# Patient Record
Sex: Female | Born: 1970 | Race: Black or African American | Hispanic: No | Marital: Single | Smoking: Current every day smoker
Health system: Southern US, Community
[De-identification: ages and names within clinical notes are randomized; demographics above are authoritative.]

## PROBLEM LIST (undated history)

## (undated) DIAGNOSIS — I071 Rheumatic tricuspid insufficiency: Secondary | ICD-10-CM

## (undated) DIAGNOSIS — F259 Schizoaffective disorder, unspecified: Secondary | ICD-10-CM

## (undated) DIAGNOSIS — I10 Essential (primary) hypertension: Secondary | ICD-10-CM

## (undated) DIAGNOSIS — I2510 Atherosclerotic heart disease of native coronary artery without angina pectoris: Secondary | ICD-10-CM

## (undated) DIAGNOSIS — I509 Heart failure, unspecified: Secondary | ICD-10-CM

## (undated) DIAGNOSIS — I472 Ventricular tachycardia: Secondary | ICD-10-CM

## (undated) DIAGNOSIS — E119 Type 2 diabetes mellitus without complications: Secondary | ICD-10-CM

## (undated) DIAGNOSIS — J45909 Unspecified asthma, uncomplicated: Secondary | ICD-10-CM

## (undated) DIAGNOSIS — I4729 Other ventricular tachycardia: Secondary | ICD-10-CM

## (undated) DIAGNOSIS — I428 Other cardiomyopathies: Secondary | ICD-10-CM

## (undated) DIAGNOSIS — I340 Nonrheumatic mitral (valve) insufficiency: Secondary | ICD-10-CM

## (undated) DIAGNOSIS — I5020 Unspecified systolic (congestive) heart failure: Secondary | ICD-10-CM

## (undated) HISTORY — DX: Nonrheumatic mitral (valve) insufficiency: I34.0

## (undated) HISTORY — DX: Heart failure, unspecified: I50.9

## (undated) HISTORY — DX: Unspecified systolic (congestive) heart failure: I50.20

## (undated) HISTORY — DX: Essential (primary) hypertension: I10

## (undated) HISTORY — DX: Morbid (severe) obesity due to excess calories: E66.01

## (undated) HISTORY — DX: Ventricular tachycardia: I47.2

## (undated) HISTORY — DX: Schizoaffective disorder, unspecified: F25.9

## (undated) HISTORY — DX: Other ventricular tachycardia: I47.29

## (undated) HISTORY — DX: Other cardiomyopathies: I42.8

## (undated) HISTORY — DX: Rheumatic tricuspid insufficiency: I07.1

---

## 2021-07-04 ENCOUNTER — Emergency Department: Payer: Medicare Other

## 2021-07-04 ENCOUNTER — Inpatient Hospital Stay
Admission: EM | Admit: 2021-07-04 | Discharge: 2021-07-12 | DRG: 175 | Disposition: A | Discharge status: Home Health | Payer: Medicare Other | Attending: Internal Medicine | Admitting: Internal Medicine

## 2021-07-04 ENCOUNTER — Other Ambulatory Visit: Payer: Self-pay

## 2021-07-04 DIAGNOSIS — I428 Other cardiomyopathies: Secondary | ICD-10-CM

## 2021-07-04 DIAGNOSIS — I2609 Other pulmonary embolism with acute cor pulmonale: Secondary | ICD-10-CM | POA: Diagnosis present

## 2021-07-04 DIAGNOSIS — I471 Supraventricular tachycardia: Secondary | ICD-10-CM | POA: Diagnosis not present

## 2021-07-04 DIAGNOSIS — F319 Bipolar disorder, unspecified: Secondary | ICD-10-CM | POA: Diagnosis present

## 2021-07-04 DIAGNOSIS — I252 Old myocardial infarction: Secondary | ICD-10-CM

## 2021-07-04 DIAGNOSIS — F209 Schizophrenia, unspecified: Secondary | ICD-10-CM | POA: Diagnosis present

## 2021-07-04 DIAGNOSIS — Z8249 Family history of ischemic heart disease and other diseases of the circulatory system: Secondary | ICD-10-CM

## 2021-07-04 DIAGNOSIS — I5082 Biventricular heart failure: Secondary | ICD-10-CM | POA: Diagnosis present

## 2021-07-04 DIAGNOSIS — J189 Pneumonia, unspecified organism: Secondary | ICD-10-CM

## 2021-07-04 DIAGNOSIS — I429 Cardiomyopathy, unspecified: Secondary | ICD-10-CM

## 2021-07-04 DIAGNOSIS — R570 Cardiogenic shock: Secondary | ICD-10-CM | POA: Diagnosis not present

## 2021-07-04 DIAGNOSIS — R188 Other ascites: Secondary | ICD-10-CM | POA: Diagnosis present

## 2021-07-04 DIAGNOSIS — I5043 Acute on chronic combined systolic (congestive) and diastolic (congestive) heart failure: Secondary | ICD-10-CM | POA: Diagnosis present

## 2021-07-04 DIAGNOSIS — I110 Hypertensive heart disease with heart failure: Secondary | ICD-10-CM | POA: Diagnosis present

## 2021-07-04 DIAGNOSIS — E785 Hyperlipidemia, unspecified: Secondary | ICD-10-CM | POA: Diagnosis present

## 2021-07-04 DIAGNOSIS — Z79899 Other long term (current) drug therapy: Secondary | ICD-10-CM | POA: Diagnosis not present

## 2021-07-04 DIAGNOSIS — Z87891 Personal history of nicotine dependence: Secondary | ICD-10-CM

## 2021-07-04 DIAGNOSIS — I248 Other forms of acute ischemic heart disease: Secondary | ICD-10-CM | POA: Diagnosis present

## 2021-07-04 DIAGNOSIS — Z6841 Body Mass Index (BMI) 40.0 and over, adult: Secondary | ICD-10-CM | POA: Diagnosis not present

## 2021-07-04 DIAGNOSIS — I5041 Acute combined systolic (congestive) and diastolic (congestive) heart failure: Secondary | ICD-10-CM | POA: Diagnosis not present

## 2021-07-04 DIAGNOSIS — I2510 Atherosclerotic heart disease of native coronary artery without angina pectoris: Secondary | ICD-10-CM | POA: Diagnosis present

## 2021-07-04 DIAGNOSIS — I2699 Other pulmonary embolism without acute cor pulmonale: Secondary | ICD-10-CM | POA: Diagnosis present

## 2021-07-04 DIAGNOSIS — E872 Acidosis: Secondary | ICD-10-CM | POA: Diagnosis present

## 2021-07-04 DIAGNOSIS — Z833 Family history of diabetes mellitus: Secondary | ICD-10-CM | POA: Diagnosis not present

## 2021-07-04 DIAGNOSIS — I081 Rheumatic disorders of both mitral and tricuspid valves: Secondary | ICD-10-CM | POA: Diagnosis present

## 2021-07-04 DIAGNOSIS — I10 Essential (primary) hypertension: Secondary | ICD-10-CM | POA: Diagnosis not present

## 2021-07-04 DIAGNOSIS — I50810 Right heart failure, unspecified: Secondary | ICD-10-CM

## 2021-07-04 DIAGNOSIS — Z452 Encounter for adjustment and management of vascular access device: Secondary | ICD-10-CM

## 2021-07-04 DIAGNOSIS — A419 Sepsis, unspecified organism: Secondary | ICD-10-CM

## 2021-07-04 DIAGNOSIS — I5023 Acute on chronic systolic (congestive) heart failure: Secondary | ICD-10-CM | POA: Diagnosis not present

## 2021-07-04 DIAGNOSIS — I420 Dilated cardiomyopathy: Secondary | ICD-10-CM | POA: Diagnosis present

## 2021-07-04 DIAGNOSIS — Z20822 Contact with and (suspected) exposure to covid-19: Secondary | ICD-10-CM | POA: Diagnosis present

## 2021-07-04 DIAGNOSIS — E1165 Type 2 diabetes mellitus with hyperglycemia: Secondary | ICD-10-CM | POA: Diagnosis present

## 2021-07-04 DIAGNOSIS — I5033 Acute on chronic diastolic (congestive) heart failure: Secondary | ICD-10-CM | POA: Diagnosis not present

## 2021-07-04 DIAGNOSIS — Z7984 Long term (current) use of oral hypoglycemic drugs: Secondary | ICD-10-CM | POA: Diagnosis not present

## 2021-07-04 DIAGNOSIS — Z90710 Acquired absence of both cervix and uterus: Secondary | ICD-10-CM

## 2021-07-04 DIAGNOSIS — I2693 Single subsegmental pulmonary embolism without acute cor pulmonale: Secondary | ICD-10-CM | POA: Diagnosis not present

## 2021-07-04 DIAGNOSIS — J45909 Unspecified asthma, uncomplicated: Secondary | ICD-10-CM | POA: Diagnosis present

## 2021-07-04 HISTORY — DX: Unspecified asthma, uncomplicated: J45.909

## 2021-07-04 HISTORY — DX: Essential (primary) hypertension: I10

## 2021-07-04 HISTORY — DX: Atherosclerotic heart disease of native coronary artery without angina pectoris: I25.10

## 2021-07-04 HISTORY — DX: Type 2 diabetes mellitus without complications: E11.9

## 2021-07-04 LAB — COMPREHENSIVE METABOLIC PANEL
ALT: 49 U/L — ABNORMAL HIGH (ref 0–44)
AST: 39 U/L (ref 15–41)
Albumin: 3.3 g/dL — ABNORMAL LOW (ref 3.5–5.0)
Alkaline Phosphatase: 65 U/L (ref 38–126)
Anion gap: 11 (ref 5–15)
BUN: 13 mg/dL (ref 6–20)
CO2: 22 mmol/L (ref 22–32)
Calcium: 8.9 mg/dL (ref 8.9–10.3)
Chloride: 103 mmol/L (ref 98–111)
Creatinine, Ser: 0.87 mg/dL (ref 0.44–1.00)
GFR, Estimated: >60 mL/min (ref >60)
Glucose, Bld: 339 mg/dL — ABNORMAL HIGH (ref 70–99)
Potassium: 3.9 mmol/L (ref 3.5–5.1)
Sodium: 136 mmol/L (ref 135–145)
Total Bilirubin: 0.8 mg/dL (ref 0.3–1.2)
Total Protein: 6.6 g/dL (ref 6.5–8.1)

## 2021-07-04 LAB — RESP PANEL BY RT-PCR (FLU A&B, COVID) ARPGX2
Influenza A by PCR: NEGATIVE
Influenza B by PCR: NEGATIVE
SARS Coronavirus 2 by RT PCR: NEGATIVE

## 2021-07-04 LAB — CBC WITH DIFFERENTIAL/PLATELET
Abs Immature Granulocytes: 0.12 10*3/uL — ABNORMAL HIGH (ref 0.00–0.07)
Basophils Absolute: 0.1 10*3/uL (ref 0.0–0.1)
Basophils Relative: 0 %
Eosinophils Absolute: 0 10*3/uL (ref 0.0–0.5)
Eosinophils Relative: 0 %
HCT: 39 % (ref 36.0–46.0)
Hemoglobin: 12.3 g/dL (ref 12.0–15.0)
Immature Granulocytes: 1 %
Lymphocytes Relative: 8 %
Lymphs Abs: 1.6 10*3/uL (ref 0.7–4.0)
MCH: 29.1 pg (ref 26.0–34.0)
MCHC: 31.5 g/dL (ref 30.0–36.0)
MCV: 92.2 fL (ref 80.0–100.0)
Monocytes Absolute: 1.3 10*3/uL — ABNORMAL HIGH (ref 0.1–1.0)
Monocytes Relative: 6 %
Neutro Abs: 17.6 10*3/uL — ABNORMAL HIGH (ref 1.7–7.7)
Neutrophils Relative %: 85 %
Platelets: 372 10*3/uL (ref 150–400)
RBC: 4.23 MIL/uL (ref 3.87–5.11)
RDW: 14.6 % (ref 11.5–15.5)
WBC: 20.7 10*3/uL — ABNORMAL HIGH (ref 4.0–10.5)
nRBC: 0 % (ref 0.0–0.2)

## 2021-07-04 LAB — MAGNESIUM: Magnesium: 1.4 mg/dL — ABNORMAL LOW (ref 1.7–2.4)

## 2021-07-04 LAB — PROTIME-INR
INR: 1.1 (ref 0.8–1.2)
Prothrombin Time: 14 seconds (ref 11.4–15.2)

## 2021-07-04 LAB — BRAIN NATRIURETIC PEPTIDE: B Natriuretic Peptide: 1128.3 pg/mL — ABNORMAL HIGH (ref 0.0–100.0)

## 2021-07-04 LAB — LIPASE, BLOOD: Lipase: 34 U/L (ref 11–51)

## 2021-07-04 LAB — LACTIC ACID, PLASMA: Lactic Acid, Venous: 3.2 mmol/L (ref 0.5–1.9)

## 2021-07-04 LAB — TROPONIN I (HIGH SENSITIVITY)
Troponin I (High Sensitivity): 101 ng/L (ref <18)
Troponin I (High Sensitivity): 92 ng/L — ABNORMAL HIGH (ref <18)

## 2021-07-04 LAB — APTT: aPTT: 28 seconds (ref 24–36)

## 2021-07-04 MED ORDER — ONDANSETRON HCL 4 MG/2ML IJ SOLN: 4.0000 mg | Freq: Four times a day (QID) | INTRAMUSCULAR | Status: DC | PRN

## 2021-07-04 MED ORDER — FUROSEMIDE 10 MG/ML IJ SOLN
20.0000 mg | Freq: Two times a day (BID) | INTRAMUSCULAR | Status: DC
  Administered 2021-07-05: 20 mg via INTRAVENOUS
  Filled 2021-07-04: qty 2

## 2021-07-04 MED ORDER — PANTOPRAZOLE SODIUM 40 MG PO TBEC
40.0000 mg | DELAYED_RELEASE_TABLET | Freq: Every day | ORAL | Status: DC
  Administered 2021-07-05 – 2021-07-12 (×8): 40 mg via ORAL
  Filled 2021-07-04 (×8): qty 1

## 2021-07-04 MED ORDER — METOPROLOL TARTRATE 5 MG/5ML IV SOLN
5.0000 mg | INTRAVENOUS | Status: AC | PRN
  Administered 2021-07-04 – 2021-07-05 (×3): 5 mg via INTRAVENOUS
  Filled 2021-07-04 (×3): qty 5

## 2021-07-04 MED ORDER — SODIUM CHLORIDE 0.9 % IV SOLN
500.0000 mg | Freq: Once | INTRAVENOUS | Status: AC
  Administered 2021-07-04: 500 mg via INTRAVENOUS
  Filled 2021-07-04: qty 500

## 2021-07-04 MED ORDER — POTASSIUM PHOSPHATE MONOBASIC 500 MG PO TABS
500.0000 mg | ORAL_TABLET | Freq: Two times a day (BID) | ORAL | Status: DC
  Administered 2021-07-05 – 2021-07-12 (×15): 500 mg via ORAL
  Filled 2021-07-04 (×17): qty 1

## 2021-07-04 MED ORDER — SODIUM CHLORIDE 0.9 % IV SOLN
2.0000 g | INTRAVENOUS | Status: DC
  Administered 2021-07-05: 2 g via INTRAVENOUS
  Filled 2021-07-04: qty 2
  Filled 2021-07-04: qty 20

## 2021-07-04 MED ORDER — ONDANSETRON HCL 4 MG PO TABS: 4.0000 mg | ORAL_TABLET | Freq: Four times a day (QID) | ORAL | Status: DC | PRN

## 2021-07-04 MED ORDER — IOHEXOL 350 MG/ML SOLN
100.0000 mL | Freq: Once | INTRAVENOUS | Status: AC | PRN
  Administered 2021-07-04: 100 mL via INTRAVENOUS

## 2021-07-04 MED ORDER — HEPARIN (PORCINE) 25000 UT/250ML-% IV SOLN
1550.0000 [IU]/h | INTRAVENOUS | Status: DC
  Administered 2021-07-04: 1350 [IU]/h via INTRAVENOUS
  Administered 2021-07-05: 1500 [IU]/h via INTRAVENOUS
  Administered 2021-07-06: 2100 [IU]/h via INTRAVENOUS
  Administered 2021-07-06: 1650 [IU]/h via INTRAVENOUS
  Administered 2021-07-07: 1950 [IU]/h via INTRAVENOUS
  Administered 2021-07-07: 1650 [IU]/h via INTRAVENOUS
  Administered 2021-07-08 – 2021-07-09 (×2): 1550 [IU]/h via INTRAVENOUS
  Filled 2021-07-04 (×8): qty 250

## 2021-07-04 MED ORDER — DIGOXIN 0.25 MG/ML IJ SOLN
0.2500 mg | Freq: Once | INTRAMUSCULAR | Status: AC
  Administered 2021-07-05: 0.25 mg via INTRAVENOUS
  Filled 2021-07-04: qty 2

## 2021-07-04 MED ORDER — HALOPERIDOL 5 MG PO TABS
10.0000 mg | ORAL_TABLET | Freq: Every day | ORAL | Status: DC
  Administered 2021-07-05 – 2021-07-11 (×6): 10 mg via ORAL
  Filled 2021-07-04 (×8): qty 2

## 2021-07-04 MED ORDER — SODIUM CHLORIDE 0.9 % IV SOLN
500.0000 mg | INTRAVENOUS | Status: DC
  Filled 2021-07-04: qty 500

## 2021-07-04 MED ORDER — TRAZODONE HCL 50 MG PO TABS
25.0000 mg | ORAL_TABLET | Freq: Every evening | ORAL | Status: DC | PRN
Start: 2021-07-04 — End: 2021-07-12
  Administered 2021-07-08: 25 mg via ORAL
  Filled 2021-07-04: qty 1

## 2021-07-04 MED ORDER — ATORVASTATIN CALCIUM 20 MG PO TABS
20.0000 mg | ORAL_TABLET | Freq: Every day | ORAL | Status: DC
  Administered 2021-07-05 – 2021-07-12 (×8): 20 mg via ORAL
  Filled 2021-07-04 (×8): qty 1

## 2021-07-04 MED ORDER — ALBUTEROL SULFATE (2.5 MG/3ML) 0.083% IN NEBU: 2.5000 mg | INHALATION_SOLUTION | RESPIRATORY_TRACT | Status: DC | PRN

## 2021-07-04 MED ORDER — SODIUM CHLORIDE 0.9 % IV SOLN
2.0000 g | Freq: Once | INTRAVENOUS | Status: AC
  Administered 2021-07-04: 2 g via INTRAVENOUS
  Filled 2021-07-04: qty 20

## 2021-07-04 MED ORDER — GLIPIZIDE 5 MG PO TABS
5.0000 mg | ORAL_TABLET | Freq: Two times a day (BID) | ORAL | Status: DC
  Filled 2021-07-04: qty 1

## 2021-07-04 MED ORDER — ACETAMINOPHEN 650 MG RE SUPP: 650.0000 mg | Freq: Four times a day (QID) | RECTAL | Status: DC | PRN

## 2021-07-04 MED ORDER — HEPARIN BOLUS VIA INFUSION
5400.0000 [IU] | Freq: Once | INTRAVENOUS | Status: AC
  Administered 2021-07-04: 5400 [IU] via INTRAVENOUS
  Filled 2021-07-04: qty 5400

## 2021-07-04 MED ORDER — MAGNESIUM HYDROXIDE 400 MG/5ML PO SUSP: 30.0000 mL | Freq: Every day | ORAL | Status: DC | PRN

## 2021-07-04 MED ORDER — LACTATED RINGERS IV BOLUS
1000.0000 mL | Freq: Once | INTRAVENOUS | Status: AC
  Administered 2021-07-04: 1000 mL via INTRAVENOUS

## 2021-07-04 MED ORDER — ALBUTEROL SULFATE HFA 108 (90 BASE) MCG/ACT IN AERS: 1.0000 | INHALATION_SPRAY | RESPIRATORY_TRACT | Status: DC | PRN

## 2021-07-04 MED ORDER — MAGNESIUM SULFATE 2 GM/50ML IV SOLN
2.0000 g | Freq: Once | INTRAVENOUS | Status: AC
  Administered 2021-07-04: 2 g via INTRAVENOUS
  Filled 2021-07-04: qty 50

## 2021-07-04 MED ORDER — DULOXETINE HCL 30 MG PO CPEP
60.0000 mg | ORAL_CAPSULE | Freq: Every day | ORAL | Status: DC
  Administered 2021-07-05 – 2021-07-12 (×8): 60 mg via ORAL
  Filled 2021-07-04 (×8): qty 2

## 2021-07-04 MED ORDER — POTASSIUM CHLORIDE CRYS ER 20 MEQ PO TBCR
20.0000 meq | EXTENDED_RELEASE_TABLET | Freq: Every day | ORAL | Status: DC
  Administered 2021-07-05 – 2021-07-12 (×8): 20 meq via ORAL
  Filled 2021-07-04 (×8): qty 1

## 2021-07-04 MED ORDER — ACETAMINOPHEN 325 MG PO TABS
650.0000 mg | ORAL_TABLET | Freq: Four times a day (QID) | ORAL | Status: DC | PRN
  Administered 2021-07-05 – 2021-07-11 (×2): 650 mg via ORAL
  Filled 2021-07-04 (×2): qty 2

## 2021-07-04 NOTE — ED Notes (Addendum)
Patient complaining of right arm pain after having to hold her arms up in the CT scan. BP 84/68 Larinda Buttery, MD aware.

## 2021-07-04 NOTE — ED Notes (Signed)
Patient to CT at this time

## 2021-07-04 NOTE — Consult Note (Signed)
ANTICOAGULATION CONSULT NOTE - Initial Consult  Pharmacy Consult for Heparin infusion Indication: pulmonary embolus  No Known Allergies  Patient Measurements: Height: 5\' 4"  (162.6 cm) Weight: 97.1 kg (214 lb) IBW/kg (Calculated) : 54.7 Heparin Dosing Weight: 77 kg   Vital Signs: Temp: 97.6 F (36.4 C) (07/14 1908) Temp Source: Oral (07/14 1908) BP: 94/74 (07/14 2130) Pulse Rate: 129 (07/14 2130)  Labs: Recent Labs    07/04/21 1942  HGB 12.3  HCT 39.0  PLT 372  CREATININE 0.87  TROPONINIHS 101*    Estimated Creatinine Clearance: 87.6 mL/min (by C-G formula based on SCr of 0.87 mg/dL).   Medical History: History reviewed. No pertinent past medical history.  Medications:  No prior anticoagulation noted  Assessment: 50 y/o female with Hx of recent MI, presents with chest pain. CT positive for RLL PE without evidence of right heart strain. Pharmacy has been consulted for initiation and management of heparin infusion for PE.  Goal of Therapy:  Heparin level 0.3-0.7 units/ml Anti-Xa level 0.6-1 units/ml 4hrs after LMWH dose given Monitor platelets by anticoagulation protocol: Yes   Plan:  Give 5400 units bolus x 1 Start heparin infusion at 1350 units/hr Check anti-Xa level in 6 hours and daily while on heparin Continue to monitor H&H and platelets  44, PharmD, BCPS Clinical Pharmacist   07/04/2021,9:43 PM

## 2021-07-04 NOTE — ED Triage Notes (Addendum)
Pt in with co chest pain that started today, pt states hx of MI in April. Pt also having night sweat for 2 days. States hx of tachycardia and was due to low potassium and magnesium.

## 2021-07-04 NOTE — ED Notes (Signed)
Fluids stopped per Larinda Buttery, MD.

## 2021-07-04 NOTE — H&P (Addendum)
[place]   PATIENT NAME: Martha Ramos    MR#:  [id number]  DATE OF BIRTH:  04-11-1971  DATE OF ADMISSION:  07/04/2021  PRIMARY CARE PHYSICIAN: Darnelle Spangle, MD   Patient is coming from: Home  REQUESTING/REFERRING PHYSICIAN: Chesley Noon, MD  CHIEF COMPLAINT:   Chief Complaint  Patient presents with   Chest Pain    HISTORY OF PRESENT ILLNESS:  Martha Ramos is a 50 y.o. African American female with medical history significant for asthma, type 2 diabetes mellitus, hypertension and coronary artery disease, who presented to the emergency room with acute onset of midsternal chest pain felt this tightness that started earlier today with associated dyspnea as well as cough occasionally productive of clear sputum as well as occasional wheezing.  She graded her pain was 10/10 in severity.  She was having mild palpitations.  She also admits to nausea and vomiting without diarrhea or melena or bright red bleeding per rectum.  No bilious vomitus or hematemesis or other bleeding diathesis.  She denies any leg pain or swelling or recent travels or surgeries.  No dysuria, oliguria or hematuria or flank pain she admitted to mild headache without dizziness or blurred vision.  ED Course: Upon presentation to the emergency room blood pressure was 99/83 with a heart rate of 161 and respiratory to of 24 and later 33 and heart rate later was 146 with pulse oximetry of 96% and later 100% on room air.  Labs revealed a blood glucose of 339 and magnesium 1.4 with potassium of 3.9 with otherwise unremarkable CMP.  BNP was 1128.3 and high-sensitivity troponin was 101.  Lactic acid was 3.2 and CBC showed leukocytosis of 20.7 with neutrophilia.  INR was 1.1 with PT of 14 and PTT of 28.  UA was positive for UTI.  Influenza antigens and COVID-19 PCR came back negative.  Repeat troponin I came back 92. EKG as reviewed by me : Initial EKG showed sinus tachycardia with a rate of 157 with left axis  deviation and poor R wave progression. Imaging: EKG showed small effusions bilaterally with likely right basilar atelectasis. Chest CTA and abdominal and pelvic CT revealed the following: 1. Right lower lobe lobar pulmonary artery embolus. No CT evidence of right heart straining. 2. Mild cardiomegaly with evidence of right heart dysfunction. Correlation with echocardiogram recommended. 3. Small bilateral pleural effusions with bibasilar atelectasis. Pneumonia is not excluded. Clinical correlation is recommended. 4. Small ascites and diffuse subcutaneous edema and anasarca. 5. No bowel obstruction. Normal appendix. 6. Aortic Atherosclerosis.  The patient was given IV heparin bolus and infusion, IV Rocephin and Zithromax, 1 L bolus of IV lactated ringer and 2 g of IV magnesium sulfate as well as 5 mg IV Lopressor.  Given borderline blood pressure, she will be admitted to a stepdown unit bed for further evaluation and management.  PAST MEDICAL HISTORY:   Past Medical History:  Diagnosis Date   Asthma    CAD (coronary artery disease)    Diabetes (HCC)    Hypertension     PAST SURGICAL HISTORY:  Tonsillectomy Hysterectomy  SOCIAL HISTORY:   Social History   Tobacco Use   Smoking status: Not on file   Smokeless tobacco: Not on file  Substance Use Topics   Alcohol use: Not on file  She denies any history of tobacco, EtOH abuse or illicit drug use  FAMILY HISTORY:  Positive for CHF, renal failure and diabetes mellitus in her mother.  DRUG ALLERGIES:  No Known Allergies  REVIEW OF SYSTEMS:   ROS As per history of present illness. All pertinent systems were reviewed above. Constitutional, HEENT, cardiovascular, respiratory, GI, GU, musculoskeletal, neuro, psychiatric, endocrine, integumentary and hematologic systems were reviewed and are otherwise negative/unremarkable except for positive findings mentioned above in the HPI.   MEDICATIONS AT HOME:   Prior to Admission  medications   Medication Sig Start Date End Date Taking? Authorizing Provider  atorvastatin (LIPITOR) 20 MG tablet Take 1 tablet by mouth daily.   Yes [provider]  DULoxetine (CYMBALTA) 60 MG capsule Take 60 mg by mouth daily. 05/21/21  Yes [provider]  glipiZIDE (GLUCOTROL) 5 MG tablet Take 5 mg by mouth 2 (two) times daily. 04/17/21  Yes [provider]  haloperidol (HALDOL) 10 MG tablet Take 10 mg by mouth at bedtime. 05/15/21  Yes [provider]  K-PHOS 500 MG tablet Take 500 mg by mouth 2 (two) times daily. 06/25/21  Yes [provider]  metFORMIN (GLUCOPHAGE) 500 MG tablet Take 500 mg by mouth 2 (two) times daily. 04/17/21  Yes [provider]  omeprazole (PRILOSEC) 20 MG capsule Take 20 mg by mouth 2 (two) times daily. 05/03/21  Yes [provider]  potassium chloride SA (KLOR-CON) 20 MEQ tablet Take 20 mEq by mouth daily. 06/20/21  Yes [provider]  VENTOLIN HFA 108 (90 Base) MCG/ACT inhaler Inhale 1-2 puffs into the lungs every 4 (four) hours as needed. 04/17/21  Yes [provider]      VITAL SIGNS:  Blood pressure 112/62, pulse (!) 132, temperature 97.6 F (36.4 C), temperature source Oral, resp. rate (!) 30, height 5\' 4"  (1.626 m), weight 97.1 kg, SpO2 93 %.  PHYSICAL EXAMINATION:  Physical Exam  GENERAL:  50 y.o.-year-old African-American female patient lying in the bed with no acute distress.  EYES: Pupils equal, round, reactive to light and accommodation. No scleral icterus. Extraocular muscles intact.  HEENT: Head atraumatic, normocephalic. Oropharynx and nasopharynx clear.  NECK:  Supple, no jugular venous distention. No thyroid enlargement, no tenderness.  LUNGS: Normal breath sounds bilaterally, no wheezing, rales,rhonchi or crepitation. No use of accessory muscles of respiration.  CARDIOVASCULAR: Regular rate and rhythm, S1, S2 normal. No murmurs, rubs, or gallops.  ABDOMEN: Soft,  nondistended, nontender. Bowel sounds present. No organomegaly or mass.  EXTREMITIES: No pedal edema, cyanosis, or clubbing.  NEUROLOGIC: Cranial nerves II through XII are intact. Muscle strength 5/5 in all extremities. Sensation intact. Gait not checked.  PSYCHIATRIC: The patient is alert and oriented x 3.  Normal affect and good eye contact. SKIN: No obvious rash, lesion, or ulcer.   LABORATORY PANEL:   CBC Recent Labs  Lab 07/04/21 1942  WBC 20.7*  HGB 12.3  HCT 39.0  PLT 372   ------------------------------------------------------------------------------------------------------------------  Chemistries  Recent Labs  Lab 07/04/21 1942  NA 136  K 3.9  CL 103  CO2 22  GLUCOSE 339*  BUN 13  CREATININE 0.87  CALCIUM 8.9  MG 1.4*  AST 39  ALT 49*  ALKPHOS 65  BILITOT 0.8   ------------------------------------------------------------------------------------------------------------------  Cardiac Enzymes No results for input(s): TROPONINI in the last 168 hours. ------------------------------------------------------------------------------------------------------------------  RADIOLOGY:  CT Angio Chest PE W/Cm &/Or Wo Cm  Result Date: 07/04/2021 CLINICAL DATA:  50 year old female with abdominal pain and chest pain. EXAM: CT ANGIOGRAPHY CHEST CT ABDOMEN AND PELVIS WITH CONTRAST TECHNIQUE: Multidetector CT imaging of the chest was performed using the standard protocol during bolus administration of intravenous contrast.  Multiplanar CT image reconstructions and MIPs were obtained to evaluate the vascular anatomy. Multidetector CT imaging of the abdomen and pelvis was performed using the standard protocol during bolus administration of intravenous contrast. CONTRAST:  OMNIPAQUE IOHEXOL 350 MG/ML SOLN COMPARISON:  Chest radiograph dated 07/04/2021. FINDINGS: CTA CHEST FINDINGS Cardiovascular: There is mild cardiomegaly. There is retrograde flow of contrast from the right  atrium into the IVC suggestive of right heart dysfunction. Correlation with echocardiogram recommended. No pericardial effusion. The thoracic aorta is unremarkable. Pulmonary artery embolus noted at the right lower lobe lobar branch point (145/5). No CT evidence of right heart straining. Mediastinum/Nodes: There is no hilar or mediastinal adenopathy. The esophagus and the thyroid gland are grossly unremarkable. No mediastinal fluid collection. Lungs/Pleura: Small bilateral pleural effusions with bibasilar atelectasis. Pneumonia is not excluded clinical correlation is recommended. No pneumothorax. The central airways are patent. Musculoskeletal: Diffuse subcutaneous edema. No fluid collection. No acute osseous pathology. Review of the MIP images confirms the above findings. CT ABDOMEN and PELVIS FINDINGS No intra-abdominal free air. Small ascites. Hepatobiliary: No focal liver abnormality is seen. No gallstones, gallbladder wall thickening, or biliary dilatation. Pancreas: Unremarkable. No pancreatic ductal dilatation or surrounding inflammatory changes. Spleen: Normal in size without focal abnormality. Adrenals/Urinary Tract: The adrenal glands are unremarkable. There is no hydronephrosis and side. There is symmetric enhancement and excretion of contrast by both kidneys. The visualized ureters and the urinary bladder appear unremarkable. Stomach/Bowel: There is no bowel obstruction or active inflammation. The appendix is normal. Vascular/Lymphatic: Mild aortoiliac atherosclerotic disease. The IVC is unremarkable. No portal venous gas. There is no adenopathy. Reproductive: Hysterectomy. No adnexal masses. Other: Diffuse subcutaneous edema. Small fat containing umbilical hernia. Musculoskeletal: No acute or significant osseous findings. Review of the MIP images confirms the above findings. IMPRESSION: 1. Right lower lobe lobar pulmonary artery embolus. No CT evidence of right heart straining. 2. Mild cardiomegaly with  evidence of right heart dysfunction. Correlation with echocardiogram recommended. 3. Small bilateral pleural effusions with bibasilar atelectasis. Pneumonia is not excluded. Clinical correlation is recommended. 4. Small ascites and diffuse subcutaneous edema and anasarca. 5. No bowel obstruction. Normal appendix. 6. Aortic Atherosclerosis (ICD10-I70.0). These results were called by telephone at the time of interpretation on 07/04/2021 at 9:31 pm to provider [facility] , who verbally acknowledged these results. Electronically Signed   By: Elgie Collard M.D.   On: 07/04/2021 21:34   CT Abdomen Pelvis W Contrast  Result Date: 07/04/2021 CLINICAL DATA:  50 year old female with abdominal pain and chest pain. EXAM: CT ANGIOGRAPHY CHEST CT ABDOMEN AND PELVIS WITH CONTRAST TECHNIQUE: Multidetector CT imaging of the chest was performed using the standard protocol during bolus administration of intravenous contrast. Multiplanar CT image reconstructions and MIPs were obtained to evaluate the vascular anatomy. Multidetector CT imaging of the abdomen and pelvis was performed using the standard protocol during bolus administration of intravenous contrast. CONTRAST:  OMNIPAQUE IOHEXOL 350 MG/ML SOLN COMPARISON:  Chest radiograph dated 07/04/2021. FINDINGS: CTA CHEST FINDINGS Cardiovascular: There is mild cardiomegaly. There is retrograde flow of contrast from the right atrium into the IVC suggestive of right heart dysfunction. Correlation with echocardiogram recommended. No pericardial effusion. The thoracic aorta is unremarkable. Pulmonary artery embolus noted at the right lower lobe lobar branch point (145/5). No CT evidence of right heart straining. Mediastinum/Nodes: There is no hilar or mediastinal adenopathy. The esophagus and the thyroid gland are grossly unremarkable. No mediastinal fluid collection. Lungs/Pleura: Small bilateral pleural effusions with bibasilar atelectasis. Pneumonia is  not excluded  clinical correlation is recommended. No pneumothorax. The central airways are patent. Musculoskeletal: Diffuse subcutaneous edema. No fluid collection. No acute osseous pathology. Review of the MIP images confirms the above findings. CT ABDOMEN and PELVIS FINDINGS No intra-abdominal free air. Small ascites. Hepatobiliary: No focal liver abnormality is seen. No gallstones, gallbladder wall thickening, or biliary dilatation. Pancreas: Unremarkable. No pancreatic ductal dilatation or surrounding inflammatory changes. Spleen: Normal in size without focal abnormality. Adrenals/Urinary Tract: The adrenal glands are unremarkable. There is no hydronephrosis and side. There is symmetric enhancement and excretion of contrast by both kidneys. The visualized ureters and the urinary bladder appear unremarkable. Stomach/Bowel: There is no bowel obstruction or active inflammation. The appendix is normal. Vascular/Lymphatic: Mild aortoiliac atherosclerotic disease. The IVC is unremarkable. No portal venous gas. There is no adenopathy. Reproductive: Hysterectomy. No adnexal masses. Other: Diffuse subcutaneous edema. Small fat containing umbilical hernia. Musculoskeletal: No acute or significant osseous findings. Review of the MIP images confirms the above findings. IMPRESSION: 1. Right lower lobe lobar pulmonary artery embolus. No CT evidence of right heart straining. 2. Mild cardiomegaly with evidence of right heart dysfunction. Correlation with echocardiogram recommended. 3. Small bilateral pleural effusions with bibasilar atelectasis. Pneumonia is not excluded. Clinical correlation is recommended. 4. Small ascites and diffuse subcutaneous edema and anasarca. 5. No bowel obstruction. Normal appendix. 6. Aortic Atherosclerosis (ICD10-I70.0). These results were called by telephone at the time of interpretation on 07/04/2021 at 9:31 pm to provider [facility] , who verbally acknowledged these results. Electronically Signed   By:  Elgie Collard M.D.   On: 07/04/2021 21:34   DG Chest Portable 1 View  Result Date: 07/04/2021 CLINICAL DATA:  Chest pain EXAM: PORTABLE CHEST 1 VIEW COMPARISON:  None. FINDINGS: Cardiac shadow is enlarged but somewhat accentuated by the portable technique. Small bilateral pleural effusions are noted right greater than left. Likely right basilar atelectasis is present as well. No bony abnormality is seen. IMPRESSION: Small effusions bilaterally with likely right basilar atelectasis. Electronically Signed   By: Alcide Clever M.D.   On: 07/04/2021 20:29      IMPRESSION AND PLAN:  Active Problems:   Acute pulmonary embolism (HCC)  1.  Acute right lower lobe pulmonary embolism with associated acute new onset CHF likely diastolic heart failure possibly acute cor pulmonale. - The patient will be admitted to a stepdown unit bed. - We will continue her on IV heparin drip. - We will obtain bilateral lower extremity venous Doppler as well as a 2D echo for further assessment. - Cardiology consult to be obtained. - I notified Dr. Welton Flakes about the patient. - With improvement of her blood pressure she can be gently diuresed.  2.  Suspected pneumonia and UTI with sepsis as manifested by leukocytosis, tachycardia and tachypnea. - The patient will be placed on IV Rocephin and Zithromax. - She was given an additional bolus of 250 mL IV normal saline and she will be on saline lock for now given her acute cor pulmonale/CHF. - We will obtain sputum and blood cultures. - Mucolytic therapy will be provided.  3.  Essential hypertension. - We will hold off antihypertensives given current borderline blood pressure.  4.  Type 2 diabetes mellitus. - We will place the patient on supplemental coverage with subcutaneous NovoLog. - We will hold off her metformin.  5.  Dyslipidemia. - We will continue statin therapy.  6.  Asthma without exacerbation. - She will be placed on as needed Xopenex and Atrovent.  DVT  prophylaxis: IV heparin. Code Status: full code. Family Communication:  The plan of care was discussed in details with the patient (and family). I answered all questions. The patient agreed to proceed with the above mentioned plan. Further management will depend upon hospital course. Disposition Plan: Back to previous home environment Consults called: Cardiology. All the records are reviewed and case discussed with ED provider.  Status is: Inpatient  Remains inpatient appropriate because:Hemodynamically unstable, Ongoing diagnostic testing needed not appropriate for outpatient work up, Unsafe d/c plan, IV treatments appropriate due to intensity of illness or inability to take PO, and Inpatient level of care appropriate due to severity of illness  Dispo: The patient is from: Home              Anticipated d/c is to: Home              Patient currently is not medically stable to d/c.   Difficult to place patient No   TOTAL TIME TAKING CARE OF THIS PATIENT: 60 minutes.    Hannah Beat M.D on 07/04/2021 at 11:38 PM  [facility]   From 7 PM-7 AM, contact night-coverage [email or web address]  CC: Primary care physician; Darnelle Spangle, MD

## 2021-07-04 NOTE — ED Provider Notes (Signed)
[facility] Emergency Department Provider Note   ____________________________________________   Event Date/Time   First MD Initiated Contact with Patient 07/04/21 1915     (approximate)  I have reviewed the triage vital signs and the nursing notes.   HISTORY  Chief Complaint Chest Pain    HPI Martha Ramos is a 50 y.o. female with past medical history of hypertension, diabetes, asthma, CAD, SVT, and schizophrenia who presents to the ED for chest pain.  Patient reports that about 2 hours prior to arrival she had sudden onset of pressure in the center of her chest.  She states that this pain has been constant, exacerbated when she takes a deep breath.  She denies any associated fevers or cough, but she has started to feel short of breath.  She has noticed swelling in her legs but denies any leg pain.  Since the onset of pain, she has felt like her heart is racing.  She describes symptoms as similar to when she was diagnosed with ACS at [facility] in April.  She states she does not take any blood thinners.        Past Medical History:  Diagnosis Date   Asthma    CAD (coronary artery disease)    Diabetes (HCC)    Hypertension     Patient Active Problem List   Diagnosis Date Noted   Acute pulmonary embolism (HCC) 07/04/2021    History reviewed. No pertinent surgical history.  Prior to Admission medications   Medication Sig Start Date End Date Taking? Authorizing Provider  atorvastatin (LIPITOR) 20 MG tablet Take 1 tablet by mouth daily.   Yes [provider]  DULoxetine (CYMBALTA) 60 MG capsule Take 60 mg by mouth daily. 05/21/21  Yes [provider]  glipiZIDE (GLUCOTROL) 5 MG tablet Take 5 mg by mouth 2 (two) times daily. 04/17/21  Yes [provider]  haloperidol (HALDOL) 10 MG tablet Take 10 mg by mouth at bedtime. 05/15/21  Yes [provider]  K-PHOS 500 MG tablet Take 500 mg by mouth 2 (two) times daily. 06/25/21   Yes [provider]  metFORMIN (GLUCOPHAGE) 500 MG tablet Take 500 mg by mouth 2 (two) times daily. 04/17/21  Yes [provider]  omeprazole (PRILOSEC) 20 MG capsule Take 20 mg by mouth 2 (two) times daily. 05/03/21  Yes [provider]  potassium chloride SA (KLOR-CON) 20 MEQ tablet Take 20 mEq by mouth daily. 06/20/21  Yes [provider]  VENTOLIN HFA 108 (90 Base) MCG/ACT inhaler Inhale 1-2 puffs into the lungs every 4 (four) hours as needed. 04/17/21  Yes [provider]    Allergies Patient has no known allergies.  No family history on file.  Social History    Review of Systems  Constitutional: No fever/chills Eyes: No visual changes. ENT: No sore throat. Cardiovascular: Positive for palpitations and chest pain. Respiratory: Positive for shortness of breath. Gastrointestinal: No abdominal pain.  No nausea, no vomiting.  No diarrhea.  No constipation. Genitourinary: Negative for dysuria. Musculoskeletal: Negative for back pain.  Positive for leg swelling. Skin: Negative for rash. Neurological: Negative for headaches, focal weakness or numbness.  ____________________________________________   PHYSICAL EXAM:  VITAL SIGNS: ED Triage Vitals  Enc Vitals Group     BP 07/04/21 1908 99/83     Pulse Rate 07/04/21 1908 (!) 161     Resp 07/04/21 1908 (!) 24     Temp 07/04/21 1908 97.6 F (36.4 C)  Temp Source 07/04/21 1908 Oral     SpO2 07/04/21 1908 97 %     Weight 07/04/21 1912 214 lb (97.1 kg)     Height 07/04/21 1912 5\' 4"  (1.626 m)     Head Circumference --      Peak Flow --      Pain Score 07/04/21 1912 9     Pain Loc --      Pain Edu? --      Excl. in GC? --     Constitutional: Alert and oriented. Eyes: Conjunctivae are normal. Head: Atraumatic. Nose: No congestion/rhinnorhea. Mouth/Throat: Mucous membranes are moist. Neck: Normal ROM Cardiovascular: Tachycardic, regular rhythm. Grossly normal heart sounds.  2+  radial pulses bilaterally. Respiratory: Normal respiratory effort.  No retractions. Lungs CTAB. Gastrointestinal: Soft and nontender. No distention. Genitourinary: deferred Musculoskeletal: No lower extremity tenderness nor edema. Neurologic:  Normal speech and language. No gross focal neurologic deficits are appreciated. Skin:  Skin is warm, dry and intact. No rash noted. Psychiatric: Mood and affect are normal. Speech and behavior are normal.  ____________________________________________   LABS (all labs ordered are listed, but only abnormal results are displayed)  Labs Reviewed  CBC WITH DIFFERENTIAL/PLATELET - Abnormal; Notable for the following components:      Result Value   WBC 20.7 (*)    Neutro Abs 17.6 (*)    Monocytes Absolute 1.3 (*)    Abs Immature Granulocytes 0.12 (*)    All other components within normal limits  MAGNESIUM - Abnormal; Notable for the following components:   Magnesium 1.4 (*)    All other components within normal limits  BRAIN NATRIURETIC PEPTIDE - Abnormal; Notable for the following components:   B Natriuretic Peptide 1,128.3 (*)    All other components within normal limits  COMPREHENSIVE METABOLIC PANEL - Abnormal; Notable for the following components:   Glucose, Bld 339 (*)    Albumin 3.3 (*)    ALT 49 (*)    All other components within normal limits  LACTIC ACID, PLASMA - Abnormal; Notable for the following components:   Lactic Acid, Venous 3.2 (*)    All other components within normal limits  TROPONIN I (HIGH SENSITIVITY) - Abnormal; Notable for the following components:   Troponin I (High Sensitivity) 101 (*)    All other components within normal limits  TROPONIN I (HIGH SENSITIVITY) - Abnormal; Notable for the following components:   Troponin I (High Sensitivity) 92 (*)    All other components within normal limits  RESP PANEL BY RT-PCR (FLU A&B, COVID) ARPGX2  CULTURE, BLOOD (ROUTINE X 2)  CULTURE, BLOOD (ROUTINE X 2)  LIPASE, BLOOD   APTT  PROTIME-INR  URINALYSIS, COMPLETE (UACMP) WITH MICROSCOPIC  HEPARIN LEVEL (UNFRACTIONATED)  LACTIC ACID, PLASMA  HIV ANTIBODY (ROUTINE TESTING W REFLEX)  BRAIN NATRIURETIC PEPTIDE  COMPREHENSIVE METABOLIC PANEL  CBC  MAGNESIUM   ____________________________________________  EKG  ED ECG REPORT I, 07/06/21, the attending physician, personally viewed and interpreted this ECG.   Date: 07/04/2021  EKG Time: 19:13  Rate: 157  Rhythm: Sinus tachycardia vs atrial flutter vs atrial fibrillation with RVR  Axis: LAD  Intervals:none  ST&T Change: None  ED ECG REPORT I, 07/06/2021, the attending physician, personally viewed and interpreted this ECG.   Date: 07/04/2021  EKG Time: 20:33  Rate: 129  Rhythm: sinus tachycardia, occasional PVC  Axis: Normal  Intervals: Prolonged QT  ST&T Change: Nonspecific T wave changes    PROCEDURES  Procedure(s) performed (including Critical  Care):  Marland KitchenCritical Care  Date/Time: 07/04/2021 10:53 PM Performed by: Chesley Noon, MD Authorized by: Chesley Noon, MD   Critical care provider statement:    Critical care time (minutes):  45   Critical care time was exclusive of:  Separately billable procedures and treating other patients and teaching time   Critical care was necessary to treat or prevent imminent or life-threatening deterioration of the following conditions:  Circulatory failure and cardiac failure   Critical care was time spent personally by me on the following activities:  Discussions with consultants, evaluation of patient's response to treatment, examination of patient, ordering and performing treatments and interventions, ordering and review of laboratory studies, ordering and review of radiographic studies, pulse oximetry, re-evaluation of patient's condition, obtaining history from patient or surrogate and review of old charts   I assumed direction of critical care for this patient from another provider in my  specialty: no     Care discussed with: admitting provider     ____________________________________________   INITIAL IMPRESSION / ASSESSMENT AND PLAN / ED COURSE      50 year old female with past medical history of hypertension, diabetes, CAD, SVT, asthma, and schizophrenia who presents to the ED for acute onset chest pain, shortness of breath, and palpitations starting 2 hours prior to arrival.  Patient noted to be tachycardic into the 140s on arrival, initial EKG with unclear rhythm, differential including sinus tachycardia, atrial flutter with 2-1 block, and atrial fibrillation.  IV fluids were started with no change in patient's heart rate, however after she was given a dose of IV metoprolol her heart rate dropped into the 120s, where consistent P waves became more obvious.  Rhythm appears consistent with sinus tachycardia and troponin noted to be elevated.  CTA of chest shows right lobar pulmonary embolism along with questionable pneumonia.  We will start patient on IV heparin, also plan to start antibiotics given leukocytosis, tachycardia, and possible pneumonia noted on CTA.  Case discussed with hospitalist for admission.      ____________________________________________   FINAL CLINICAL IMPRESSION(S) / ED DIAGNOSES  Final diagnoses:  Acute pulmonary embolism without acute cor pulmonale, unspecified pulmonary embolism type [facility])     ED Discharge Orders     None        Note:  This document was prepared using Dragon voice recognition software and may include unintentional dictation errors.    Chesley Noon, MD 07/04/21 2258

## 2021-07-04 NOTE — ED Notes (Signed)
Trop 101. Larinda Buttery, MD aware.

## 2021-07-05 ENCOUNTER — Inpatient Hospital Stay: Payer: Medicare Other

## 2021-07-05 ENCOUNTER — Inpatient Hospital Stay
Admit: 2021-07-05 | Discharge: 2021-07-05 | Disposition: A | Discharge status: Home / Self Care | Payer: Medicare Other | Attending: Family Medicine | Admitting: Family Medicine

## 2021-07-05 DIAGNOSIS — E1165 Type 2 diabetes mellitus with hyperglycemia: Secondary | ICD-10-CM

## 2021-07-05 DIAGNOSIS — I5033 Acute on chronic diastolic (congestive) heart failure: Secondary | ICD-10-CM | POA: Insufficient documentation

## 2021-07-05 DIAGNOSIS — I2699 Other pulmonary embolism without acute cor pulmonale: Secondary | ICD-10-CM | POA: Diagnosis not present

## 2021-07-05 LAB — URINALYSIS, COMPLETE (UACMP) WITH MICROSCOPIC
Bacteria, UA: NONE SEEN
Bilirubin Urine: NEGATIVE
Glucose, UA: 50 mg/dL — AB
Hgb urine dipstick: NEGATIVE
Ketones, ur: NEGATIVE mg/dL
Nitrite: NEGATIVE
Protein, ur: NEGATIVE mg/dL
Specific Gravity, Urine: >1.046 — ABNORMAL HIGH (ref 1.005–1.030)
pH: 5 (ref 5.0–8.0)

## 2021-07-05 LAB — URINE DRUG SCREEN, QUALITATIVE (ARMC ONLY)
Amphetamines, Ur Screen: NOT DETECTED
Barbiturates, Ur Screen: NOT DETECTED
Benzodiazepine, Ur Scrn: NOT DETECTED
Cannabinoid 50 Ng, Ur ~~LOC~~: POSITIVE — AB
Cocaine Metabolite,Ur ~~LOC~~: NOT DETECTED
MDMA (Ecstasy)Ur Screen: NOT DETECTED
Methadone Scn, Ur: NOT DETECTED
Opiate, Ur Screen: NOT DETECTED
Phencyclidine (PCP) Ur S: NOT DETECTED
Tricyclic, Ur Screen: NOT DETECTED

## 2021-07-05 LAB — COMPREHENSIVE METABOLIC PANEL
ALT: 40 U/L (ref 0–44)
AST: 20 U/L (ref 15–41)
Albumin: 3 g/dL — ABNORMAL LOW (ref 3.5–5.0)
Alkaline Phosphatase: 58 U/L (ref 38–126)
Anion gap: 7 (ref 5–15)
BUN: 12 mg/dL (ref 6–20)
CO2: 24 mmol/L (ref 22–32)
Calcium: 8.6 mg/dL — ABNORMAL LOW (ref 8.9–10.3)
Chloride: 106 mmol/L (ref 98–111)
Creatinine, Ser: 0.64 mg/dL (ref 0.44–1.00)
GFR, Estimated: >60 mL/min (ref >60)
Glucose, Bld: 266 mg/dL — ABNORMAL HIGH (ref 70–99)
Potassium: 4.1 mmol/L (ref 3.5–5.1)
Sodium: 137 mmol/L (ref 135–145)
Total Bilirubin: 0.8 mg/dL (ref 0.3–1.2)
Total Protein: 6.3 g/dL — ABNORMAL LOW (ref 6.5–8.1)

## 2021-07-05 LAB — GLUCOSE, CAPILLARY
Glucose-Capillary: 298 mg/dL — ABNORMAL HIGH (ref 70–99)
Glucose-Capillary: 300 mg/dL — ABNORMAL HIGH (ref 70–99)
Glucose-Capillary: 207 mg/dL — ABNORMAL HIGH (ref 70–99)
Glucose-Capillary: 102 mg/dL — ABNORMAL HIGH (ref 70–99)
Glucose-Capillary: 130 mg/dL — ABNORMAL HIGH (ref 70–99)

## 2021-07-05 LAB — CBC
HCT: 36.5 % (ref 36.0–46.0)
Hemoglobin: 11.6 g/dL — ABNORMAL LOW (ref 12.0–15.0)
MCH: 28.9 pg (ref 26.0–34.0)
MCHC: 31.8 g/dL (ref 30.0–36.0)
MCV: 91 fL (ref 80.0–100.0)
Platelets: 216 10*3/uL (ref 150–400)
RBC: 4.01 MIL/uL (ref 3.87–5.11)
RDW: 14.8 % (ref 11.5–15.5)
WBC: 14.6 10*3/uL — ABNORMAL HIGH (ref 4.0–10.5)
nRBC: 0 % (ref 0.0–0.2)

## 2021-07-05 LAB — ECHOCARDIOGRAM COMPLETE
AR max vel: 2.02 cm2
AV Area VTI: 2.06 cm2
AV Area mean vel: 2.06 cm2
AV Mean grad: 1 mmHg
AV Peak grad: 2.7 mmHg
Ao pk vel: 0.83 m/s
Area-P 1/2: 5.06 cm2
Calc EF: 15.8 %
Height: 64 in
MV VTI: 2.33 cm2
S' Lateral: 5.5 cm
Single Plane A2C EF: 16 %
Single Plane A4C EF: 13.3 %
Weight: 4617.31 oz

## 2021-07-05 LAB — TSH: TSH: 1.204 u[IU]/mL (ref 0.350–4.500)

## 2021-07-05 LAB — MAGNESIUM: Magnesium: 1.8 mg/dL (ref 1.7–2.4)

## 2021-07-05 LAB — HEPARIN LEVEL (UNFRACTIONATED)
Heparin Unfractionated: 0.21 IU/mL — ABNORMAL LOW (ref 0.30–0.70)
Heparin Unfractionated: 0.22 IU/mL — ABNORMAL LOW (ref 0.30–0.70)

## 2021-07-05 LAB — PROCALCITONIN: Procalcitonin: 0.23 ng/mL

## 2021-07-05 LAB — HEMOGLOBIN A1C
Hgb A1c MFr Bld: 6.2 % — ABNORMAL HIGH (ref 4.8–5.6)
Mean Plasma Glucose: 131.24 mg/dL

## 2021-07-05 LAB — HIV ANTIBODY (ROUTINE TESTING W REFLEX): HIV Screen 4th Generation wRfx: NONREACTIVE

## 2021-07-05 LAB — MRSA NEXT GEN BY PCR, NASAL: MRSA by PCR Next Gen: NOT DETECTED

## 2021-07-05 LAB — LACTIC ACID, PLASMA: Lactic Acid, Venous: 2.6 mmol/L (ref 0.5–1.9)

## 2021-07-05 LAB — BRAIN NATRIURETIC PEPTIDE: B Natriuretic Peptide: 1356.8 pg/mL — ABNORMAL HIGH (ref 0.0–100.0)

## 2021-07-05 MED ORDER — HEPARIN BOLUS VIA INFUSION
1000.0000 [IU] | Freq: Once | INTRAVENOUS | Status: AC
  Administered 2021-07-05: 1000 [IU] via INTRAVENOUS
  Filled 2021-07-05: qty 1000

## 2021-07-05 MED ORDER — DIGOXIN 0.25 MG/ML IJ SOLN
0.1250 mg | Freq: Once | INTRAMUSCULAR | Status: AC
  Administered 2021-07-05: 0.125 mg via INTRAVENOUS
  Filled 2021-07-05: qty 2

## 2021-07-05 MED ORDER — ALBUMIN HUMAN 25 % IV SOLN
25.0000 g | Freq: Once | INTRAVENOUS | Status: AC
  Administered 2021-07-05: 25 g via INTRAVENOUS
  Filled 2021-07-05: qty 100

## 2021-07-05 MED ORDER — GLIPIZIDE 5 MG PO TABS
5.0000 mg | ORAL_TABLET | Freq: Two times a day (BID) | ORAL | Status: DC
  Administered 2021-07-05 – 2021-07-07 (×5): 5 mg via ORAL
  Filled 2021-07-05 (×6): qty 1

## 2021-07-05 MED ORDER — ORAL CARE MOUTH RINSE
15.0000 mL | Freq: Two times a day (BID) | OROMUCOSAL | Status: DC
  Administered 2021-07-06 – 2021-07-11 (×10): 15 mL via OROMUCOSAL

## 2021-07-05 MED ORDER — SODIUM CHLORIDE 0.9 % IV BOLUS
250.0000 mL | Freq: Once | INTRAVENOUS | Status: AC
  Administered 2021-07-05: 250 mL via INTRAVENOUS

## 2021-07-05 MED ORDER — KETOROLAC TROMETHAMINE 15 MG/ML IJ SOLN
15.0000 mg | Freq: Four times a day (QID) | INTRAMUSCULAR | Status: DC | PRN
  Administered 2021-07-05 (×2): 15 mg via INTRAVENOUS
  Filled 2021-07-05 (×4): qty 1

## 2021-07-05 MED ORDER — PERFLUTREN LIPID MICROSPHERE
1.0000 mL | INTRAVENOUS | Status: AC | PRN
  Administered 2021-07-05: 2 mL via INTRAVENOUS
  Filled 2021-07-05: qty 10

## 2021-07-05 MED ORDER — CHLORHEXIDINE GLUCONATE CLOTH 2 % EX PADS
6.0000 | MEDICATED_PAD | Freq: Every day | CUTANEOUS | Status: DC
  Administered 2021-07-05 – 2021-07-12 (×7): 6 via TOPICAL

## 2021-07-05 MED ORDER — INSULIN GLARGINE 100 UNIT/ML ~~LOC~~ SOLN
12.0000 [IU] | Freq: Every day | SUBCUTANEOUS | Status: DC
  Administered 2021-07-05 – 2021-07-06 (×2): 12 [IU] via SUBCUTANEOUS
  Filled 2021-07-05 (×3): qty 0.12

## 2021-07-05 MED ORDER — METOPROLOL TARTRATE 5 MG/5ML IV SOLN
5.0000 mg | Freq: Once | INTRAVENOUS | Status: AC
  Administered 2021-07-05: 5 mg via INTRAVENOUS
  Filled 2021-07-05: qty 5

## 2021-07-05 MED ORDER — FUROSEMIDE 10 MG/ML IJ SOLN: 40.0000 mg | Freq: Two times a day (BID) | INTRAMUSCULAR | Status: DC

## 2021-07-05 MED ORDER — INSULIN ASPART 100 UNIT/ML IJ SOLN
0.0000 [IU] | Freq: Three times a day (TID) | INTRAMUSCULAR | Status: DC
  Administered 2021-07-05: 5 [IU] via SUBCUTANEOUS
  Administered 2021-07-05: 3 [IU] via SUBCUTANEOUS
  Administered 2021-07-06: 2 [IU] via SUBCUTANEOUS
  Administered 2021-07-06 (×2): 3 [IU] via SUBCUTANEOUS
  Filled 2021-07-05 (×5): qty 1

## 2021-07-05 MED ORDER — HEPARIN BOLUS VIA INFUSION
1100.0000 [IU] | INTRAVENOUS | Status: AC
  Administered 2021-07-05: 1100 [IU] via INTRAVENOUS
  Filled 2021-07-05: qty 1100

## 2021-07-05 NOTE — Progress Notes (Signed)
*  PRELIMINARY RESULTS* Echocardiogram 2D Echocardiogram has been performed.  Martha Ramos 07/05/2021, 9:25 AM

## 2021-07-05 NOTE — Consult Note (Signed)
ANTICOAGULATION CONSULT NOTE  Pharmacy Consult for Heparin infusion Indication: pulmonary embolus  No Known Allergies  Patient Measurements: Height: 5\' 4"  (162.6 cm) Weight: 130.9 kg (288 lb 9.3 oz) IBW/kg (Calculated) : 54.7 Heparin Dosing Weight: 77 kg   Vital Signs: Temp: 98.8 F (37.1 C) (07/15 0200) Temp Source: Oral (07/15 0154) BP: 101/71 (07/15 0600) Pulse Rate: 129 (07/15 0600)  Labs: Recent Labs    07/04/21 1942 07/04/21 2138 07/05/21 0546  HGB 12.3  --  11.6*  HCT 39.0  --  36.5  PLT 372  --  216  APTT 28  --   --   LABPROT 14.0  --   --   INR 1.1  --   --   HEPARINUNFRC  --   --  0.21*  CREATININE 0.87  --  0.64  TROPONINIHS 101* 92*  --      Estimated Creatinine Clearance: 113.2 mL/min (by C-G formula based on SCr of 0.64 mg/dL).   Medical History: Past Medical History:  Diagnosis Date   Asthma    CAD (coronary artery disease)    Diabetes (HCC)    Hypertension     Medications:  No prior anticoagulation noted  Assessment: 50 y/o female with Hx of recent MI, presents with chest pain. CT positive for RLL PE without evidence of right heart strain. Pharmacy has been consulted for initiation and management of heparin infusion for PE.  Goal of Therapy:  Heparin level 0.3-0.7 units/ml Anti-Xa level 0.6-1 units/ml 4hrs after LMWH dose given Monitor platelets by anticoagulation protocol: Yes  7/15 0546 HL 0.21, subtherapeutic   Plan:  Bolus 1100 unit bolus x 1 Increase heparin infusion to 1500 units/hr Recheck HL in 6 hours Daily CBC while on heparin.  8/15, PharmD, [facility] 07/05/2021 6:58 AM

## 2021-07-05 NOTE — Progress Notes (Signed)
Inpatient Diabetes Program Recommendations  AACE/ADA: New Consensus Statement on Inpatient Glycemic Control (2015)  Target Ranges:  Prepandial:   less than 140 mg/dL      Peak postprandial:   less than 180 mg/dL (1-2 hours)      Critically ill patients:  140 - 180 mg/dL   Results for Martha Ramos, Martha Ramos (MRN [id number]) as of 07/05/2021 08:07  Ref. Range 07/05/2021 02:15  Glucose-Capillary Latest Ref Range: 70 - 99 mg/dL 871 (H)   Admit with: Acute right lower lobe pulmonary embolism with associated acute new onset CHF likely diastolic heart failure possibly acute cor pulmonale/ Suspected pneumonia and UTI with sepsis  History: DM2  Home DM Meds: Glipizide 5 mg BID       Metformin 500 mg BID  Current Orders: Glipizide 5 mg BID   MD- Please consider starting Novolog Moderate Correction Scale/ SSI (0-15 units) TID AC + HS    --Will follow patient during hospitalization--  Ambrose Finland RN, MSN, CDE Diabetes Coordinator Inpatient Glycemic Control Team Team Pager: [phone] (8a-5p)

## 2021-07-05 NOTE — Consult Note (Signed)
Martha Ramos is a 50 y.o. female  [id number]  Primary Cardiologist: Adrian Blackwater Reason for Consultation: Elevated troponin  HPI: This is 50 year old African-American female who presented to the hospital with shortness of breath palpitation and occasional chest pain.  I was asked to evaluate the patient because of mildly elevated troponin.   Review of Systems: Feeling little bit better no orthopnea PND or leg swelling   Past Medical History:  Diagnosis Date   Asthma    CAD (coronary artery disease)    Diabetes (HCC)    Hypertension     Medications Prior to Admission  Medication Sig Dispense Refill   atorvastatin (LIPITOR) 20 MG tablet Take 1 tablet by mouth daily.     DULoxetine (CYMBALTA) 60 MG capsule Take 60 mg by mouth daily.     glipiZIDE (GLUCOTROL) 5 MG tablet Take 5 mg by mouth 2 (two) times daily.     haloperidol (HALDOL) 10 MG tablet Take 10 mg by mouth at bedtime.     K-PHOS 500 MG tablet Take 500 mg by mouth 2 (two) times daily.     metFORMIN (GLUCOPHAGE) 500 MG tablet Take 500 mg by mouth 2 (two) times daily.     omeprazole (PRILOSEC) 20 MG capsule Take 20 mg by mouth 2 (two) times daily.     potassium chloride SA (KLOR-CON) 20 MEQ tablet Take 20 mEq by mouth daily.     VENTOLIN HFA 108 (90 Base) MCG/ACT inhaler Inhale 1-2 puffs into the lungs every 4 (four) hours as needed.        atorvastatin  20 mg Oral Daily   Chlorhexidine Gluconate Cloth  6 each Topical Q0600   DULoxetine  60 mg Oral Daily   furosemide  20 mg Intravenous Q12H   glipiZIDE  5 mg Oral BID AC   haloperidol  10 mg Oral QHS   insulin aspart  0-9 Units Subcutaneous TID WC   pantoprazole  40 mg Oral Daily   potassium chloride SA  20 mEq Oral Daily   potassium phosphate (monobasic)  500 mg Oral BID    Infusions:  azithromycin     cefTRIAXone (ROCEPHIN)  IV     heparin 1,500 Units/hr (07/05/21 1000)    No Known Allergies  Social History   Socioeconomic History   Marital  status: Single    Spouse name: Not on file   Number of children: Not on file   Years of education: Not on file   Highest education level: Not on file  Occupational History   Not on file  Tobacco Use   Smoking status: Not on file   Smokeless tobacco: Not on file  Substance and Sexual Activity   Alcohol use: Not on file   Drug use: Not on file   Sexual activity: Not on file  Other Topics Concern   Not on file  Social History Narrative   Not on file   Social Determinants of Health   Financial Resource Strain: Not on file  Food Insecurity: Not on file  Transportation Needs: Not on file  Physical Activity: Not on file  Stress: Not on file  Social Connections: Not on file  Intimate Partner Violence: Not on file    No family history on file.  PHYSICAL EXAM: Vitals:   07/05/21 1009 07/05/21 1118  BP: (!) 84/62 108/89  Pulse: (!) 103 (!) 136  Resp: (!) 23 20  Temp:    SpO2: 100% 100%     Intake/Output Summary (  Last 24 hours) at 07/05/2021 1205 Last data filed at 07/05/2021 1000 Gross per 24 hour  Intake 1111.88 ml  Output 250 ml  Net 861.88 ml    General:  Well appearing. No respiratory difficulty HEENT: normal Neck: supple. no JVD. Carotids 2+ bilat; no bruits. No lymphadenopathy or thryomegaly appreciated. Cor: PMI nondisplaced. Regular rate & rhythm. No rubs, gallops or murmurs. Lungs: clear Abdomen: soft, nontender, nondistended. No hepatosplenomegaly. No bruits or masses. Good bowel sounds. Extremities: no cyanosis, clubbing, rash, edema Neuro: alert & oriented x 3, cranial nerves grossly intact. moves all 4 extremities w/o difficulty. Affect pleasant.  ECG: Sinus tachycardia with poor R wave progression nonspecific ST-T changes  Results for orders placed or performed during the hospital encounter of 07/04/21 (from the past 24 hour(s))  CBC with Differential     Status: Abnormal   Collection Time: 07/04/21  7:42 PM  Result Value Ref Range   WBC 20.7 (H) 4.0  - 10.5 K/uL   RBC 4.23 3.87 - 5.11 MIL/uL   Hemoglobin 12.3 12.0 - 15.0 g/dL   HCT 16.139.0 09.636.0 - 04.546.0 %   MCV 92.2 80.0 - 100.0 fL   MCH 29.1 26.0 - 34.0 pg   MCHC 31.5 30.0 - 36.0 g/dL   RDW 40.914.6 81.111.5 - 91.415.5 %   Platelets 372 150 - 400 K/uL   nRBC 0.0 0.0 - 0.2 %   Neutrophils Relative % 85 %   Neutro Abs 17.6 (H) 1.7 - 7.7 K/uL   Lymphocytes Relative 8 %   Lymphs Abs 1.6 0.7 - 4.0 K/uL   Monocytes Relative 6 %   Monocytes Absolute 1.3 (H) 0.1 - 1.0 K/uL   Eosinophils Relative 0 %   Eosinophils Absolute 0.0 0.0 - 0.5 K/uL   Basophils Relative 0 %   Basophils Absolute 0.1 0.0 - 0.1 K/uL   Immature Granulocytes 1 %   Abs Immature Granulocytes 0.12 (H) 0.00 - 0.07 K/uL  Troponin I (High Sensitivity)     Status: Abnormal   Collection Time: 07/04/21  7:42 PM  Result Value Ref Range   Troponin I (High Sensitivity) 101 (HH) <18 ng/L  Magnesium     Status: Abnormal   Collection Time: 07/04/21  7:42 PM  Result Value Ref Range   Magnesium 1.4 (L) 1.7 - 2.4 mg/dL  Brain natriuretic peptide     Status: Abnormal   Collection Time: 07/04/21  7:42 PM  Result Value Ref Range   B Natriuretic Peptide 1,128.3 (H) 0.0 - 100.0 pg/mL  Comprehensive metabolic panel     Status: Abnormal   Collection Time: 07/04/21  7:42 PM  Result Value Ref Range   Sodium 136 135 - 145 mmol/L   Potassium 3.9 3.5 - 5.1 mmol/L   Chloride 103 98 - 111 mmol/L   CO2 22 22 - 32 mmol/L   Glucose, Bld 339 (H) 70 - 99 mg/dL   BUN 13 6 - 20 mg/dL   Creatinine, Ser 7.820.87 0.44 - 1.00 mg/dL   Calcium 8.9 8.9 - 95.610.3 mg/dL   Total Protein 6.6 6.5 - 8.1 g/dL   Albumin 3.3 (L) 3.5 - 5.0 g/dL   AST 39 15 - 41 U/L   ALT 49 (H) 0 - 44 U/L   Alkaline Phosphatase 65 38 - 126 U/L   Total Bilirubin 0.8 0.3 - 1.2 mg/dL   GFR, Estimated >21>60 >30>60 mL/min   Anion gap 11 5 - 15  Lipase, blood     Status: None  Collection Time: 07/04/21  7:42 PM  Result Value Ref Range   Lipase 34 11 - 51 U/L  APTT     Status: None   Collection  Time: 07/04/21  7:42 PM  Result Value Ref Range   aPTT 28 24 - 36 seconds  Protime-INR     Status: None   Collection Time: 07/04/21  7:42 PM  Result Value Ref Range   Prothrombin Time 14.0 11.4 - 15.2 seconds   INR 1.1 0.8 - 1.2  Lactic acid, plasma     Status: Abnormal   Collection Time: 07/04/21  7:42 PM  Result Value Ref Range   Lactic Acid, Venous 3.2 (HH) 0.5 - 1.9 mmol/L  Troponin I (High Sensitivity)     Status: Abnormal   Collection Time: 07/04/21  9:38 PM  Result Value Ref Range   Troponin I (High Sensitivity) 92 (H) <18 ng/L  Resp Panel by RT-PCR (Flu A&B, Covid) Nasopharyngeal Swab     Status: None   Collection Time: 07/04/21  9:38 PM   Specimen: Nasopharyngeal Swab; Nasopharyngeal(NP) swabs in vial transport medium  Result Value Ref Range   SARS Coronavirus 2 by RT PCR NEGATIVE NEGATIVE   Influenza A by PCR NEGATIVE NEGATIVE   Influenza B by PCR NEGATIVE NEGATIVE  Culture, blood (routine x 2)     Status: None (Preliminary result)   Collection Time: 07/04/21 10:01 PM   Specimen: BLOOD  Result Value Ref Range   Specimen Description BLOOD BLOOD RIGHT HAND    Special Requests      BOTTLES DRAWN AEROBIC AND ANAEROBIC Blood Culture adequate volume   Culture      NO GROWTH < 12 HOURS Performed at [facility], [address] [place], [place] [address]    Report Status PENDING   Culture, blood (routine x 2)     Status: None (Preliminary result)   Collection Time: 07/04/21 10:01 PM   Specimen: BLOOD  Result Value Ref Range   Specimen Description BLOOD RIGHT ANTECUBITAL    Special Requests      BOTTLES DRAWN AEROBIC AND ANAEROBIC Blood Culture adequate volume   Culture      NO GROWTH < 12 HOURS Performed at [facility], [address] [place], [place] [address]    Report Status PENDING   Urinalysis, Complete w Microscopic Urine, Clean Catch     Status: Abnormal   Collection Time: 07/04/21 11:30 PM  Result Value Ref Range   Color, Urine YELLOW  (A) YELLOW   APPearance HAZY (A) CLEAR   Specific Gravity, Urine >1.046 (H) 1.005 - 1.030   pH 5.0 5.0 - 8.0   Glucose, UA 50 (A) NEGATIVE mg/dL   Hgb urine dipstick NEGATIVE NEGATIVE   Bilirubin Urine NEGATIVE NEGATIVE   Ketones, ur NEGATIVE NEGATIVE mg/dL   Protein, ur NEGATIVE NEGATIVE mg/dL   Nitrite NEGATIVE NEGATIVE   Leukocytes,Ua LARGE (A) NEGATIVE   RBC / HPF 11-20 0 - 5 RBC/hpf   WBC, UA 21-50 0 - 5 WBC/hpf   Bacteria, UA NONE SEEN NONE SEEN   Squamous Epithelial / LPF 0-5 0 - 5   Mucus PRESENT   Lactic acid, plasma     Status: Abnormal   Collection Time: 07/05/21 12:37 AM  Result Value Ref Range   Lactic Acid, Venous 2.6 (HH) 0.5 - 1.9 mmol/L  HIV Antibody (routine testing w rflx)     Status: None   Collection Time: 07/05/21 12:37 AM  Result Value Ref Range  HIV Screen 4th Generation wRfx Non Reactive Non Reactive  MRSA Next Gen by PCR, Nasal     Status: None   Collection Time: 07/05/21  2:15 AM   Specimen: Nasal Mucosa; Nasal Swab  Result Value Ref Range   MRSA by PCR Next Gen NOT DETECTED NOT DETECTED  Glucose, capillary     Status: Abnormal   Collection Time: 07/05/21  2:15 AM  Result Value Ref Range   Glucose-Capillary 298 (H) 70 - 99 mg/dL   Comment 1 Notify RN    Comment 2 Document in Chart   Heparin level (unfractionated)     Status: Abnormal   Collection Time: 07/05/21  5:46 AM  Result Value Ref Range   Heparin Unfractionated 0.21 (L) 0.30 - 0.70 IU/mL  Brain natriuretic peptide     Status: Abnormal   Collection Time: 07/05/21  5:46 AM  Result Value Ref Range   B Natriuretic Peptide 1,356.8 (H) 0.0 - 100.0 pg/mL  Comprehensive metabolic panel     Status: Abnormal   Collection Time: 07/05/21  5:46 AM  Result Value Ref Range   Sodium 137 135 - 145 mmol/L   Potassium 4.1 3.5 - 5.1 mmol/L   Chloride 106 98 - 111 mmol/L   CO2 24 22 - 32 mmol/L   Glucose, Bld 266 (H) 70 - 99 mg/dL   BUN 12 6 - 20 mg/dL   Creatinine, Ser 1.61 0.44 - 1.00 mg/dL    Calcium 8.6 (L) 8.9 - 10.3 mg/dL   Total Protein 6.3 (L) 6.5 - 8.1 g/dL   Albumin 3.0 (L) 3.5 - 5.0 g/dL   AST 20 15 - 41 U/L   ALT 40 0 - 44 U/L   Alkaline Phosphatase 58 38 - 126 U/L   Total Bilirubin 0.8 0.3 - 1.2 mg/dL   GFR, Estimated >09 >60 mL/min   Anion gap 7 5 - 15  CBC     Status: Abnormal   Collection Time: 07/05/21  5:46 AM  Result Value Ref Range   WBC 14.6 (H) 4.0 - 10.5 K/uL   RBC 4.01 3.87 - 5.11 MIL/uL   Hemoglobin 11.6 (L) 12.0 - 15.0 g/dL   HCT 45.4 09.8 - 11.9 %   MCV 91.0 80.0 - 100.0 fL   MCH 28.9 26.0 - 34.0 pg   MCHC 31.8 30.0 - 36.0 g/dL   RDW 14.7 82.9 - 56.2 %   Platelets 216 150 - 400 K/uL   nRBC 0.0 0.0 - 0.2 %  Magnesium     Status: None   Collection Time: 07/05/21  5:46 AM  Result Value Ref Range   Magnesium 1.8 1.7 - 2.4 mg/dL  TSH     Status: None   Collection Time: 07/05/21  5:46 AM  Result Value Ref Range   TSH 1.204 0.350 - 4.500 uIU/mL  Procalcitonin - Baseline     Status: None   Collection Time: 07/05/21  5:46 AM  Result Value Ref Range   Procalcitonin 0.23 ng/mL  Glucose, capillary     Status: Abnormal   Collection Time: 07/05/21  9:24 AM  Result Value Ref Range   Glucose-Capillary 300 (H) 70 - 99 mg/dL  Glucose, capillary     Status: Abnormal   Collection Time: 07/05/21 11:54 AM  Result Value Ref Range   Glucose-Capillary 207 (H) 70 - 99 mg/dL   CT Angio Chest PE W/Cm &/Or Wo Cm  Result Date: 07/04/2021 CLINICAL DATA:  50 year old female with abdominal pain and chest  pain. EXAM: CT ANGIOGRAPHY CHEST CT ABDOMEN AND PELVIS WITH CONTRAST TECHNIQUE: Multidetector CT imaging of the chest was performed using the standard protocol during bolus administration of intravenous contrast. Multiplanar CT image reconstructions and MIPs were obtained to evaluate the vascular anatomy. Multidetector CT imaging of the abdomen and pelvis was performed using the standard protocol during bolus administration of intravenous contrast. CONTRAST:   OMNIPAQUE IOHEXOL 350 MG/ML SOLN COMPARISON:  Chest radiograph dated 07/04/2021. FINDINGS: CTA CHEST FINDINGS Cardiovascular: There is mild cardiomegaly. There is retrograde flow of contrast from the right atrium into the IVC suggestive of right heart dysfunction. Correlation with echocardiogram recommended. No pericardial effusion. The thoracic aorta is unremarkable. Pulmonary artery embolus noted at the right lower lobe lobar branch point (145/5). No CT evidence of right heart straining. Mediastinum/Nodes: There is no hilar or mediastinal adenopathy. The esophagus and the thyroid gland are grossly unremarkable. No mediastinal fluid collection. Lungs/Pleura: Small bilateral pleural effusions with bibasilar atelectasis. Pneumonia is not excluded clinical correlation is recommended. No pneumothorax. The central airways are patent. Musculoskeletal: Diffuse subcutaneous edema. No fluid collection. No acute osseous pathology. Review of the MIP images confirms the above findings. CT ABDOMEN and PELVIS FINDINGS No intra-abdominal free air. Small ascites. Hepatobiliary: No focal liver abnormality is seen. No gallstones, gallbladder wall thickening, or biliary dilatation. Pancreas: Unremarkable. No pancreatic ductal dilatation or surrounding inflammatory changes. Spleen: Normal in size without focal abnormality. Adrenals/Urinary Tract: The adrenal glands are unremarkable. There is no hydronephrosis and side. There is symmetric enhancement and excretion of contrast by both kidneys. The visualized ureters and the urinary bladder appear unremarkable. Stomach/Bowel: There is no bowel obstruction or active inflammation. The appendix is normal. Vascular/Lymphatic: Mild aortoiliac atherosclerotic disease. The IVC is unremarkable. No portal venous gas. There is no adenopathy. Reproductive: Hysterectomy. No adnexal masses. Other: Diffuse subcutaneous edema. Small fat containing umbilical hernia. Musculoskeletal: No acute or  significant osseous findings. Review of the MIP images confirms the above findings. IMPRESSION: 1. Right lower lobe lobar pulmonary artery embolus. No CT evidence of right heart straining. 2. Mild cardiomegaly with evidence of right heart dysfunction. Correlation with echocardiogram recommended. 3. Small bilateral pleural effusions with bibasilar atelectasis. Pneumonia is not excluded. Clinical correlation is recommended. 4. Small ascites and diffuse subcutaneous edema and anasarca. 5. No bowel obstruction. Normal appendix. 6. Aortic Atherosclerosis (ICD10-I70.0). These results were called by telephone at the time of interpretation on 07/04/2021 at 9:31 pm to provider [facility] , who verbally acknowledged these results. Electronically Signed   By: Elgie Collard M.D.   On: 07/04/2021 21:34   CT Abdomen Pelvis W Contrast  Result Date: 07/04/2021 CLINICAL DATA:  50 year old female with abdominal pain and chest pain. EXAM: CT ANGIOGRAPHY CHEST CT ABDOMEN AND PELVIS WITH CONTRAST TECHNIQUE: Multidetector CT imaging of the chest was performed using the standard protocol during bolus administration of intravenous contrast. Multiplanar CT image reconstructions and MIPs were obtained to evaluate the vascular anatomy. Multidetector CT imaging of the abdomen and pelvis was performed using the standard protocol during bolus administration of intravenous contrast. CONTRAST:  OMNIPAQUE IOHEXOL 350 MG/ML SOLN COMPARISON:  Chest radiograph dated 07/04/2021. FINDINGS: CTA CHEST FINDINGS Cardiovascular: There is mild cardiomegaly. There is retrograde flow of contrast from the right atrium into the IVC suggestive of right heart dysfunction. Correlation with echocardiogram recommended. No pericardial effusion. The thoracic aorta is unremarkable. Pulmonary artery embolus noted at the right lower lobe lobar branch point (145/5). No CT evidence of right heart straining. Mediastinum/Nodes:  There is no hilar or mediastinal  adenopathy. The esophagus and the thyroid gland are grossly unremarkable. No mediastinal fluid collection. Lungs/Pleura: Small bilateral pleural effusions with bibasilar atelectasis. Pneumonia is not excluded clinical correlation is recommended. No pneumothorax. The central airways are patent. Musculoskeletal: Diffuse subcutaneous edema. No fluid collection. No acute osseous pathology. Review of the MIP images confirms the above findings. CT ABDOMEN and PELVIS FINDINGS No intra-abdominal free air. Small ascites. Hepatobiliary: No focal liver abnormality is seen. No gallstones, gallbladder wall thickening, or biliary dilatation. Pancreas: Unremarkable. No pancreatic ductal dilatation or surrounding inflammatory changes. Spleen: Normal in size without focal abnormality. Adrenals/Urinary Tract: The adrenal glands are unremarkable. There is no hydronephrosis and side. There is symmetric enhancement and excretion of contrast by both kidneys. The visualized ureters and the urinary bladder appear unremarkable. Stomach/Bowel: There is no bowel obstruction or active inflammation. The appendix is normal. Vascular/Lymphatic: Mild aortoiliac atherosclerotic disease. The IVC is unremarkable. No portal venous gas. There is no adenopathy. Reproductive: Hysterectomy. No adnexal masses. Other: Diffuse subcutaneous edema. Small fat containing umbilical hernia. Musculoskeletal: No acute or significant osseous findings. Review of the MIP images confirms the above findings. IMPRESSION: 1. Right lower lobe lobar pulmonary artery embolus. No CT evidence of right heart straining. 2. Mild cardiomegaly with evidence of right heart dysfunction. Correlation with echocardiogram recommended. 3. Small bilateral pleural effusions with bibasilar atelectasis. Pneumonia is not excluded. Clinical correlation is recommended. 4. Small ascites and diffuse subcutaneous edema and anasarca. 5. No bowel obstruction. Normal appendix. 6. Aortic Atherosclerosis  (ICD10-I70.0). These results were called by telephone at the time of interpretation on 07/04/2021 at 9:31 pm to provider [facility] , who verbally acknowledged these results. Electronically Signed   By: Elgie Collard M.D.   On: 07/04/2021 21:34   US Venous Img Lower Bilateral (DVT)  Result Date: 07/05/2021 CLINICAL DATA:  50 year old female with PE. Evaluate for residual DVT. EXAM: BILATERAL LOWER EXTREMITY VENOUS DOPPLER ULTRASOUND TECHNIQUE: Gray-scale sonography with graded compression, as well as color Doppler and duplex ultrasound were performed to evaluate the lower extremity deep venous systems from the level of the common femoral vein and including the common femoral, femoral, profunda femoral, popliteal and calf veins including the posterior tibial, peroneal and gastrocnemius veins when visible. The superficial great saphenous vein was also interrogated. Spectral Doppler was utilized to evaluate flow at rest and with distal augmentation maneuvers in the common femoral, femoral and popliteal veins. COMPARISON:  None. FINDINGS: RIGHT LOWER EXTREMITY Common Femoral Vein: No evidence of thrombus. Normal compressibility, respiratory phasicity and response to augmentation. Saphenofemoral Junction: No evidence of thrombus. Normal compressibility and flow on color Doppler imaging. Profunda Femoral Vein: No evidence of thrombus. Normal compressibility and flow on color Doppler imaging. Femoral Vein: No evidence of thrombus. Normal compressibility, respiratory phasicity and response to augmentation. Popliteal Vein: No evidence of thrombus. Normal compressibility, respiratory phasicity and response to augmentation. Calf Veins: No evidence of thrombus. Normal compressibility and flow on color Doppler imaging. Superficial Great Saphenous Vein: No evidence of thrombus. Normal compressibility. Venous Reflux:  None. Other Findings:  None. LEFT LOWER EXTREMITY Common Femoral Vein: No evidence of thrombus. Normal  compressibility, respiratory phasicity and response to augmentation. Saphenofemoral Junction: No evidence of thrombus. Normal compressibility and flow on color Doppler imaging. Profunda Femoral Vein: No evidence of thrombus. Normal compressibility and flow on color Doppler imaging. Femoral Vein: No evidence of thrombus. Normal compressibility, respiratory phasicity and response to augmentation. Popliteal Vein: No evidence of thrombus. Normal compressibility,  respiratory phasicity and response to augmentation. Calf Veins: No evidence of thrombus. Normal compressibility and flow on color Doppler imaging. Superficial Great Saphenous Vein: No evidence of thrombus. Normal compressibility. Venous Reflux:  None. Other Findings:  None. IMPRESSION: No evidence of deep venous thrombosis in either lower extremity. Electronically Signed   By: Malachy Moan M.D.   On: 07/05/2021 11:04   DG Chest Portable 1 View  Result Date: 07/04/2021 CLINICAL DATA:  Chest pain EXAM: PORTABLE CHEST 1 VIEW COMPARISON:  None. FINDINGS: Cardiac shadow is enlarged but somewhat accentuated by the portable technique. Small bilateral pleural effusions are noted right greater than left. Likely right basilar atelectasis is present as well. No bony abnormality is seen. IMPRESSION: Small effusions bilaterally with likely right basilar atelectasis. Electronically Signed   By: Alcide Clever M.D.   On: 07/04/2021 20:29     ASSESSMENT AND PLAN: Atypical chest pain with sinus tachycardia and mildly elevated troponin of 101.  In the setting of cor pulmonale sinus tachycardia is common and mildly elevated troponin is to be expected due to lung disease.  Advise adding digoxin 0.125 after getting bolus of 0.25.  Advise outpatient follow-up.  We will do outpatient work-up for coronary artery disease once stable.  Nashay Brickley A

## 2021-07-05 NOTE — Progress Notes (Signed)
Pt remains tachycardic, sustaining HR 120-130. Cardiology input noted - Dr. Chipper Herb informed, reviewed at bedside, prn metoprolol given as ordered - minimal effect noted. Remains in ST. BP borderline, MAPs > 65 throughout. Plan for albumin infusion, unable to secure second IV access with another RN's help as well. IV team consulted. Heparin continued and titrated accordingly. UO adequate, concentrated. Pt remains Aox4, 2L O2 started for comfort and support for tachypnea.

## 2021-07-05 NOTE — Consult Note (Signed)
ANTICOAGULATION CONSULT NOTE  Pharmacy Consult for Heparin infusion Indication: pulmonary embolus  Patient Measurements: Heparin Dosing Weight: 77 kg   Labs: Recent Labs    07/04/21 1942 07/04/21 2138 07/05/21 0546 07/05/21 1305  HGB 12.3  --  11.6*  --   HCT 39.0  --  36.5  --   PLT 372  --  216  --   APTT 28  --   --   --   LABPROT 14.0  --   --   --   INR 1.1  --   --   --   HEPARINUNFRC  --   --  0.21* 0.22*  CREATININE 0.87  --  0.64  --   TROPONINIHS 101* 92*  --   --     Estimated Creatinine Clearance: 113.2 mL/min (by C-G formula based on SCr of 0.64 mg/dL).   Medical History: Past Medical History:  Diagnosis Date   Asthma    CAD (coronary artery disease)    Diabetes (HCC)    Hypertension    Medications:  No prior anticoagulation noted  Assessment: 50 y/o female with Hx of recent MI, presents with chest pain. CT positive for RLL PE without evidence of right heart strain. Pharmacy has been consulted for initiation and management of heparin infusion for PE.  Goal of Therapy:  Heparin level 0.3-0.7 units/ml Anti-Xa level 0.6-1 units/ml 4hrs after LMWH dose given Monitor platelets by anticoagulation protocol: Yes  7/15 0546 HL 0.21, subtherapeutic - 1350 units/hr 7/15 1305 HL 0.22, subtherapeutic - 1500 units/hr   Plan:  --Heparin level is subtherapeutic --Will bolus heparin 1000 units x 1 and increase heparin infusion rate to 1650 units/hr --Re-check HL 6 hours after rate change --Daily CBC per protocol while on IV heparin  [name]  07/05/2021 2:33 PM

## 2021-07-05 NOTE — Progress Notes (Addendum)
PROGRESS NOTE    Martha Ramos  IHK:[id number] DOB: 1971-07-25 DOA: 07/04/2021 PCP: Darnelle Spangle, MD   Chief complaint.  Chest pain. Brief Narrative:  Martha Ramos is a 50 y.o. African American female with medical history significant for asthma, type 2 diabetes mellitus, hypertension and coronary artery disease, who presented to the emergency room with acute onset of midsternal chest pain with associated dyspnea as well as cough occasionally productive of clear sputum and occasional wheezing. Upon arriving in the hospital, patient had a severe tachycardia, with heart rate in the 120s, leukocytosis of 20.7, lactic acidosis of 3.2.  CT angiogram of the chest showed a right lower lobe pulmonary emboli.  Mild cardiomegaly with evidence of right heart dysfunction.  Small bilateral pleural effusion with basilar atelectasis.  BNP elevated at 1128.3.  Troponin 101.  Patient was placed on IV Lasix.  She is also treated with Rocephin and Zithromax.  Assessment & Plan:   Active Problems:   Acute pulmonary embolism (HCC)  #1.  Acute right lower lobe pulmonary emboli. Elevated troponin secondary to PE. Patient did not have any recent travel, she does not have bilateral leg edema. Ultrasound of bilateral arms and legs did not show any DVT. CT scan due to suspected right-sided heart strain.  However, CT angiogram showed PE in right lower lobe.  Pending echocardiogram to evaluate heart function as well as pulmonary arterial pressure. Patient is treated with IV heparin drip.  We will continue.  We will transition to oral Eliquis when condition is more stable.  2.  Acute on chronic congestive heart failure. Echocardiogram is pending to evaluate left ventricular function as well as pulmonary arterial pressure. Patient has evidence of volume overload with elevated BNP and some congestion in CT scan. I will increase Lasix to 40 mg IV twice a day.  #3.  Uncontrolled type 2 diabetes with  hyperglycemia. Sliding scale insulin started, I will also add Lantus.  Check hemoglobin A1c.  #4.  Sinus tachycardia. Patient heart rate has been elevated at 120-130.  It is irregular on telemetry.  Patient was given 5 mg IV metoprolol, heart rate is slowed down to 122, still regular.  Consistent with sinus tachycardia. This is a probably due to acute on chronic congestive heart failure. We will also obtain urine tox screen. We will continue to monitor closely.  5.  Essential hypertension. Blood pressure medicine on hold due to borderline blood pressure.  6.  Likely urinary tract infection. Sepsis secondary to UTI. Patient has abnormal urine, urine culture pending.  Due to significant illness, I will cover with antibiotics.  I reviewed patient CT scan images, patient also has minimal elevation of procalcitonin level, does not have pneumonia.  Will discontinue Zithromax.  1521. BP borderline, will hold off lasix, give 25 grams of albumin.  DVT prophylaxis: IV Heparin Code Status: Full Family Communication:  Disposition Plan:    Status is: Inpatient  Remains inpatient appropriate because:IV treatments appropriate due to intensity of illness or inability to take PO and Inpatient level of care appropriate due to severity of illness  Dispo: The patient is from: Home              Anticipated d/c is to: Home              Patient currently is not medically stable to d/c.   Difficult to place patient No        I/O last 3 completed shifts: In: 770.3 [I.V.:121.7; IV Piggyback:648.6] Out: -  Total I/O In: 341.5 [P.O.:240; I.V.:101.5] Out: 250 [Urine:250]     Consultants:  None  Procedures: None  Antimicrobials: Rocephin  Subjective: Patient still complaining some short of breath, chest pain with deep breaths.  No hypoxia. No nausea vomiting abdominal pain. No dysuria hematuria. No fever chills  No headache or dizziness.   Objective: Vitals:   07/05/21 0900 07/05/21  1009 07/05/21 1118 07/05/21 1207  BP: 96/73 (!) 84/62 108/89 96/82  Pulse: (!) 109 (!) 103 (!) 136 (!) 124  Resp: 20 (!) 23 20 18   Temp:      TempSrc:      SpO2: 97% 100% 100% 100%  Weight:      Height:        Intake/Output Summary (Last 24 hours) at 07/05/2021 1241 Last data filed at 07/05/2021 1000 Gross per 24 hour  Intake 1111.88 ml  Output 250 ml  Net 861.88 ml   Filed Weights   07/04/21 1912 07/05/21 0200  Weight: 97.1 kg 130.9 kg    Examination:  General exam: Appears calm and comfortable  Respiratory system: Clear to auscultation. Respiratory effort normal. Cardiovascular system: S1 & S2 heard, RRR. No JVD, murmurs, rubs, gallops or clicks. No pedal edema. Gastrointestinal system: Abdomen is nondistended, soft and nontender. No organomegaly or masses felt. Normal bowel sounds heard. Central nervous system: Alert and oriented x3. No focal neurological deficits. Extremities: Symmetric 5 x 5 power. Skin: No rashes, lesions or ulcers Psychiatry: Judgement and insight appear normal. Mood & affect appropriate.     Data Reviewed: I have personally reviewed following labs and imaging studies  CBC: Recent Labs  Lab 07/04/21 1942 07/05/21 0546  WBC 20.7* 14.6*  NEUTROABS 17.6*  --   HGB 12.3 11.6*  HCT 39.0 36.5  MCV 92.2 91.0  PLT 372 216   Basic Metabolic Panel: Recent Labs  Lab 07/04/21 1942 07/05/21 0546  NA 136 137  K 3.9 4.1  CL 103 106  CO2 22 24  GLUCOSE 339* 266*  BUN 13 12  CREATININE 0.87 0.64  CALCIUM 8.9 8.6*  MG 1.4* 1.8   GFR: Estimated Creatinine Clearance: 113.2 mL/min (by C-G formula based on SCr of 0.64 mg/dL). Liver Function Tests: Recent Labs  Lab 07/04/21 1942 07/05/21 0546  AST 39 20  ALT 49* 40  ALKPHOS 65 58  BILITOT 0.8 0.8  PROT 6.6 6.3*  ALBUMIN 3.3* 3.0*   Recent Labs  Lab 07/04/21 1942  LIPASE 34   No results for input(s): AMMONIA in the last 168 hours. Coagulation Profile: Recent Labs  Lab 07/04/21 1942   INR 1.1   Cardiac Enzymes: No results for input(s): CKTOTAL, CKMB, CKMBINDEX, TROPONINI in the last 168 hours. BNP (last 3 results) No results for input(s): PROBNP in the last 8760 hours. HbA1C: No results for input(s): HGBA1C in the last 72 hours. CBG: Recent Labs  Lab 07/05/21 0215 07/05/21 0924 07/05/21 1154  GLUCAP 298* 300* 207*   Lipid Profile: No results for input(s): CHOL, HDL, LDLCALC, TRIG, CHOLHDL, LDLDIRECT in the last 72 hours. Thyroid Function Tests: Recent Labs    07/05/21 0546  TSH 1.204   Anemia Panel: No results for input(s): VITAMINB12, FOLATE, FERRITIN, TIBC, IRON, RETICCTPCT in the last 72 hours. Sepsis Labs: Recent Labs  Lab 07/04/21 1942 07/05/21 0037 07/05/21 0546  PROCALCITON  --   --  0.23  LATICACIDVEN 3.2* 2.6*  --     Recent Results (from the past 240 hour(s))  Resp Panel by RT-PCR (Flu  A&B, Covid) Nasopharyngeal Swab     Status: None   Collection Time: 07/04/21  9:38 PM   Specimen: Nasopharyngeal Swab; Nasopharyngeal(NP) swabs in vial transport medium  Result Value Ref Range Status   SARS Coronavirus 2 by RT PCR NEGATIVE NEGATIVE Final    Comment: (NOTE) SARS-CoV-2 target nucleic acids are NOT DETECTED.  The SARS-CoV-2 RNA is generally detectable in upper respiratory specimens during the acute phase of infection. The lowest concentration of SARS-CoV-2 viral copies this assay can detect is 138 copies/mL. A negative result does not preclude SARS-Cov-2 infection and should not be used as the sole basis for treatment or other patient management decisions. A negative result may occur with  improper specimen collection/handling, submission of specimen other than nasopharyngeal swab, presence of viral mutation(s) within the areas targeted by this assay, and inadequate number of viral copies(<138 copies/mL). A negative result must be combined with clinical observations, patient history, and epidemiological information. The expected  result is Negative.  Fact Sheet for Patients:  BloggerCourse.comhttps://www.fda.gov/media/152166/download  Fact Sheet for Healthcare Providers:  SeriousBroker.ithttps://www.fda.gov/media/152162/download  This test is no t yet approved or cleared by the [place] FDA and  has been authorized for detection and/or diagnosis of SARS-CoV-2 by FDA under an Emergency Use Authorization (EUA). This EUA will remain  in effect (meaning this test can be used) for the duration of the COVID-19 declaration under Section 564(b)(1) of the Act, 21 U.S.C.section 360bbb-3(b)(1), unless the authorization is terminated  or revoked sooner.       Influenza A by PCR NEGATIVE NEGATIVE Final   Influenza B by PCR NEGATIVE NEGATIVE Final    Comment: (NOTE) The Xpert Xpress SARS-CoV-2/FLU/RSV plus assay is intended as an aid in the diagnosis of influenza from Nasopharyngeal swab specimens and should not be used as a sole basis for treatment. Nasal washings and aspirates are unacceptable for Xpert Xpress SARS-CoV-2/FLU/RSV testing.  Fact Sheet for Patients: BloggerCourse.comhttps://www.fda.gov/media/152166/download  Fact Sheet for Healthcare Providers: SeriousBroker.ithttps://www.fda.gov/media/152162/download  This test is not yet approved or cleared by the [place] FDA and has been authorized for detection and/or diagnosis of SARS-CoV-2 by FDA under an Emergency Use Authorization (EUA). This EUA will remain in effect (meaning this test can be used) for the duration of the COVID-19 declaration under Section 564(b)(1) of the Act, 21 U.S.C. section 360bbb-3(b)(1), unless the authorization is terminated or revoked.  Performed at [facility], [address] [place], [place] [address]   Culture, blood (routine x 2)     Status: None (Preliminary result)   Collection Time: 07/04/21 10:01 PM   Specimen: BLOOD  Result Value Ref Range Status   Specimen Description BLOOD BLOOD RIGHT HAND  Final   Special Requests   Final    BOTTLES DRAWN AEROBIC AND  ANAEROBIC Blood Culture adequate volume   Culture   Final    NO GROWTH < 12 HOURS Performed at [facility], [address] [place], [place] [address]    Report Status PENDING  Incomplete  Culture, blood (routine x 2)     Status: None (Preliminary result)   Collection Time: 07/04/21 10:01 PM   Specimen: BLOOD  Result Value Ref Range Status   Specimen Description BLOOD RIGHT ANTECUBITAL  Final   Special Requests   Final    BOTTLES DRAWN AEROBIC AND ANAEROBIC Blood Culture adequate volume   Culture   Final    NO GROWTH < 12 HOURS Performed at [facility], [address] [place], [place] [address]  Report Status PENDING  Incomplete  MRSA Next Gen by PCR, Nasal     Status: None   Collection Time: 07/05/21  2:15 AM   Specimen: Nasal Mucosa; Nasal Swab  Result Value Ref Range Status   MRSA by PCR Next Gen NOT DETECTED NOT DETECTED Final    Comment: (NOTE) The GeneXpert MRSA Assay (FDA approved for NASAL specimens only), is one component of a comprehensive MRSA colonization surveillance program. It is not intended to diagnose MRSA infection nor to guide or monitor treatment for MRSA infections. Test performance is not FDA approved in patients less than 68 years old. Performed at [facility], [address] [place], [place] [address]          Radiology Studies: CT Angio Chest PE W/Cm &/Or Wo Cm  Result Date: 07/04/2021 CLINICAL DATA:  50 year old female with abdominal pain and chest pain. EXAM: CT ANGIOGRAPHY CHEST CT ABDOMEN AND PELVIS WITH CONTRAST TECHNIQUE: Multidetector CT imaging of the chest was performed using the standard protocol during bolus administration of intravenous contrast. Multiplanar CT image reconstructions and MIPs were obtained to evaluate the vascular anatomy. Multidetector CT imaging of the abdomen and pelvis was performed using the standard protocol during bolus administration of intravenous contrast. CONTRAST:   OMNIPAQUE IOHEXOL 350 MG/ML SOLN COMPARISON:  Chest radiograph dated 07/04/2021. FINDINGS: CTA CHEST FINDINGS Cardiovascular: There is mild cardiomegaly. There is retrograde flow of contrast from the right atrium into the IVC suggestive of right heart dysfunction. Correlation with echocardiogram recommended. No pericardial effusion. The thoracic aorta is unremarkable. Pulmonary artery embolus noted at the right lower lobe lobar branch point (145/5). No CT evidence of right heart straining. Mediastinum/Nodes: There is no hilar or mediastinal adenopathy. The esophagus and the thyroid gland are grossly unremarkable. No mediastinal fluid collection. Lungs/Pleura: Small bilateral pleural effusions with bibasilar atelectasis. Pneumonia is not excluded clinical correlation is recommended. No pneumothorax. The central airways are patent. Musculoskeletal: Diffuse subcutaneous edema. No fluid collection. No acute osseous pathology. Review of the MIP images confirms the above findings. CT ABDOMEN and PELVIS FINDINGS No intra-abdominal free air. Small ascites. Hepatobiliary: No focal liver abnormality is seen. No gallstones, gallbladder wall thickening, or biliary dilatation. Pancreas: Unremarkable. No pancreatic ductal dilatation or surrounding inflammatory changes. Spleen: Normal in size without focal abnormality. Adrenals/Urinary Tract: The adrenal glands are unremarkable. There is no hydronephrosis and side. There is symmetric enhancement and excretion of contrast by both kidneys. The visualized ureters and the urinary bladder appear unremarkable. Stomach/Bowel: There is no bowel obstruction or active inflammation. The appendix is normal. Vascular/Lymphatic: Mild aortoiliac atherosclerotic disease. The IVC is unremarkable. No portal venous gas. There is no adenopathy. Reproductive: Hysterectomy. No adnexal masses. Other: Diffuse subcutaneous edema. Small fat containing umbilical hernia. Musculoskeletal: No acute or  significant osseous findings. Review of the MIP images confirms the above findings. IMPRESSION: 1. Right lower lobe lobar pulmonary artery embolus. No CT evidence of right heart straining. 2. Mild cardiomegaly with evidence of right heart dysfunction. Correlation with echocardiogram recommended. 3. Small bilateral pleural effusions with bibasilar atelectasis. Pneumonia is not excluded. Clinical correlation is recommended. 4. Small ascites and diffuse subcutaneous edema and anasarca. 5. No bowel obstruction. Normal appendix. 6. Aortic Atherosclerosis (ICD10-I70.0). These results were called by telephone at the time of interpretation on 07/04/2021 at 9:31 pm to provider [facility] , who verbally acknowledged these results. Electronically Signed   By: Elgie Collard M.D.   On: 07/04/2021 21:34   CT Abdomen Pelvis W Contrast  Result Date: 07/04/2021 CLINICAL DATA:  50 year old female with abdominal pain and chest pain. EXAM: CT ANGIOGRAPHY CHEST CT ABDOMEN AND PELVIS WITH CONTRAST TECHNIQUE: Multidetector CT imaging of the chest was performed using the standard protocol during bolus administration of intravenous contrast. Multiplanar CT image reconstructions and MIPs were obtained to evaluate the vascular anatomy. Multidetector CT imaging of the abdomen and pelvis was performed using the standard protocol during bolus administration of intravenous contrast. CONTRAST:  OMNIPAQUE IOHEXOL 350 MG/ML SOLN COMPARISON:  Chest radiograph dated 07/04/2021. FINDINGS: CTA CHEST FINDINGS Cardiovascular: There is mild cardiomegaly. There is retrograde flow of contrast from the right atrium into the IVC suggestive of right heart dysfunction. Correlation with echocardiogram recommended. No pericardial effusion. The thoracic aorta is unremarkable. Pulmonary artery embolus noted at the right lower lobe lobar branch point (145/5). No CT evidence of right heart straining. Mediastinum/Nodes: There is no hilar or mediastinal  adenopathy. The esophagus and the thyroid gland are grossly unremarkable. No mediastinal fluid collection. Lungs/Pleura: Small bilateral pleural effusions with bibasilar atelectasis. Pneumonia is not excluded clinical correlation is recommended. No pneumothorax. The central airways are patent. Musculoskeletal: Diffuse subcutaneous edema. No fluid collection. No acute osseous pathology. Review of the MIP images confirms the above findings. CT ABDOMEN and PELVIS FINDINGS No intra-abdominal free air. Small ascites. Hepatobiliary: No focal liver abnormality is seen. No gallstones, gallbladder wall thickening, or biliary dilatation. Pancreas: Unremarkable. No pancreatic ductal dilatation or surrounding inflammatory changes. Spleen: Normal in size without focal abnormality. Adrenals/Urinary Tract: The adrenal glands are unremarkable. There is no hydronephrosis and side. There is symmetric enhancement and excretion of contrast by both kidneys. The visualized ureters and the urinary bladder appear unremarkable. Stomach/Bowel: There is no bowel obstruction or active inflammation. The appendix is normal. Vascular/Lymphatic: Mild aortoiliac atherosclerotic disease. The IVC is unremarkable. No portal venous gas. There is no adenopathy. Reproductive: Hysterectomy. No adnexal masses. Other: Diffuse subcutaneous edema. Small fat containing umbilical hernia. Musculoskeletal: No acute or significant osseous findings. Review of the MIP images confirms the above findings. IMPRESSION: 1. Right lower lobe lobar pulmonary artery embolus. No CT evidence of right heart straining. 2. Mild cardiomegaly with evidence of right heart dysfunction. Correlation with echocardiogram recommended. 3. Small bilateral pleural effusions with bibasilar atelectasis. Pneumonia is not excluded. Clinical correlation is recommended. 4. Small ascites and diffuse subcutaneous edema and anasarca. 5. No bowel obstruction. Normal appendix. 6. Aortic Atherosclerosis  (ICD10-I70.0). These results were called by telephone at the time of interpretation on 07/04/2021 at 9:31 pm to provider [facility] , who verbally acknowledged these results. Electronically Signed   By: Elgie Collard M.D.   On: 07/04/2021 21:34   US Venous Img Lower Bilateral (DVT)  Result Date: 07/05/2021 CLINICAL DATA:  50 year old female with PE. Evaluate for residual DVT. EXAM: BILATERAL LOWER EXTREMITY VENOUS DOPPLER ULTRASOUND TECHNIQUE: Gray-scale sonography with graded compression, as well as color Doppler and duplex ultrasound were performed to evaluate the lower extremity deep venous systems from the level of the common femoral vein and including the common femoral, femoral, profunda femoral, popliteal and calf veins including the posterior tibial, peroneal and gastrocnemius veins when visible. The superficial great saphenous vein was also interrogated. Spectral Doppler was utilized to evaluate flow at rest and with distal augmentation maneuvers in the common femoral, femoral and popliteal veins. COMPARISON:  None. FINDINGS: RIGHT LOWER EXTREMITY Common Femoral Vein: No evidence of thrombus. Normal compressibility, respiratory phasicity and response to augmentation. Saphenofemoral Junction: No evidence of thrombus. Normal compressibility and  flow on color Doppler imaging. Profunda Femoral Vein: No evidence of thrombus. Normal compressibility and flow on color Doppler imaging. Femoral Vein: No evidence of thrombus. Normal compressibility, respiratory phasicity and response to augmentation. Popliteal Vein: No evidence of thrombus. Normal compressibility, respiratory phasicity and response to augmentation. Calf Veins: No evidence of thrombus. Normal compressibility and flow on color Doppler imaging. Superficial Great Saphenous Vein: No evidence of thrombus. Normal compressibility. Venous Reflux:  None. Other Findings:  None. LEFT LOWER EXTREMITY Common Femoral Vein: No evidence of thrombus. Normal  compressibility, respiratory phasicity and response to augmentation. Saphenofemoral Junction: No evidence of thrombus. Normal compressibility and flow on color Doppler imaging. Profunda Femoral Vein: No evidence of thrombus. Normal compressibility and flow on color Doppler imaging. Femoral Vein: No evidence of thrombus. Normal compressibility, respiratory phasicity and response to augmentation. Popliteal Vein: No evidence of thrombus. Normal compressibility, respiratory phasicity and response to augmentation. Calf Veins: No evidence of thrombus. Normal compressibility and flow on color Doppler imaging. Superficial Great Saphenous Vein: No evidence of thrombus. Normal compressibility. Venous Reflux:  None. Other Findings:  None. IMPRESSION: No evidence of deep venous thrombosis in either lower extremity. Electronically Signed   By: Malachy Moan M.D.   On: 07/05/2021 11:04   US Venous Img Upper Uni Right(DVT)  Result Date: 07/05/2021 CLINICAL DATA:  Pulmonary embolus, right upper extremity pain and edema EXAM: RIGHT UPPER EXTREMITY VENOUS DOPPLER ULTRASOUND TECHNIQUE: Gray-scale sonography with graded compression, as well as color Doppler and duplex ultrasound were performed to evaluate the upper extremity deep venous system from the level of the subclavian vein and including the jugular, axillary, basilic, radial, ulnar and upper cephalic vein. Spectral Doppler was utilized to evaluate flow at rest and with distal augmentation maneuvers. COMPARISON:  None. FINDINGS: Contralateral Subclavian Vein: Respiratory phasicity is normal and symmetric with the symptomatic side. No evidence of thrombus. Normal compressibility. Internal Jugular Vein: No evidence of thrombus. Normal compressibility, respiratory phasicity and response to augmentation. Subclavian Vein: No evidence of thrombus. Normal compressibility, respiratory phasicity and response to augmentation. Axillary Vein: No evidence of thrombus. Normal  compressibility, respiratory phasicity and response to augmentation. Cephalic Vein: No evidence of thrombus. Normal compressibility, respiratory phasicity and response to augmentation. Basilic Vein: No evidence of thrombus. Normal compressibility, respiratory phasicity and response to augmentation. Brachial Veins: No evidence of thrombus. Normal compressibility, respiratory phasicity and response to augmentation. Radial Veins: No evidence of thrombus. Normal compressibility, respiratory phasicity and response to augmentation. Ulnar Veins: No evidence of thrombus. Normal compressibility, respiratory phasicity and response to augmentation. IMPRESSION: No evidence of DVT within the right upper extremity. Electronically Signed   By: Judie Petit.  Shick M.D.   On: 07/05/2021 12:33   DG Chest Portable 1 View  Result Date: 07/04/2021 CLINICAL DATA:  Chest pain EXAM: PORTABLE CHEST 1 VIEW COMPARISON:  None. FINDINGS: Cardiac shadow is enlarged but somewhat accentuated by the portable technique. Small bilateral pleural effusions are noted right greater than left. Likely right basilar atelectasis is present as well. No bony abnormality is seen. IMPRESSION: Small effusions bilaterally with likely right basilar atelectasis. Electronically Signed   By: Alcide Clever M.D.   On: 07/04/2021 20:29        Scheduled Meds:  atorvastatin  20 mg Oral Daily   Chlorhexidine Gluconate Cloth  6 each Topical Q0600   DULoxetine  60 mg Oral Daily   furosemide  40 mg Intravenous Q12H   glipiZIDE  5 mg Oral BID AC   haloperidol  10 mg  Oral QHS   insulin aspart  0-9 Units Subcutaneous TID WC   metoprolol tartrate  5 mg Intravenous Once   pantoprazole  40 mg Oral Daily   potassium chloride SA  20 mEq Oral Daily   potassium phosphate (monobasic)  500 mg Oral BID   Continuous Infusions:  azithromycin     cefTRIAXone (ROCEPHIN)  IV     heparin 1,500 Units/hr (07/05/21 1000)     LOS: 1 day    Time spent: 34 minutes    Marrion Coy, MD [facility]   To contact the attending provider between 7A-7P or the covering provider during after hours 7P-7A, please log into the web site [email or web address] and access using universal [place] password for that web site. If you do not have the password, please call the hospital operator.  07/05/2021, 12:41 PM

## 2021-07-06 ENCOUNTER — Encounter: Payer: Self-pay | Admitting: Family Medicine

## 2021-07-06 ENCOUNTER — Inpatient Hospital Stay: Payer: Self-pay

## 2021-07-06 ENCOUNTER — Inpatient Hospital Stay: Payer: Medicare Other

## 2021-07-06 DIAGNOSIS — I2699 Other pulmonary embolism without acute cor pulmonale: Secondary | ICD-10-CM | POA: Diagnosis not present

## 2021-07-06 DIAGNOSIS — I5023 Acute on chronic systolic (congestive) heart failure: Secondary | ICD-10-CM | POA: Diagnosis not present

## 2021-07-06 DIAGNOSIS — R570 Cardiogenic shock: Secondary | ICD-10-CM | POA: Diagnosis not present

## 2021-07-06 DIAGNOSIS — I5033 Acute on chronic diastolic (congestive) heart failure: Secondary | ICD-10-CM | POA: Diagnosis not present

## 2021-07-06 DIAGNOSIS — I5043 Acute on chronic combined systolic (congestive) and diastolic (congestive) heart failure: Secondary | ICD-10-CM

## 2021-07-06 DIAGNOSIS — E1165 Type 2 diabetes mellitus with hyperglycemia: Secondary | ICD-10-CM | POA: Diagnosis not present

## 2021-07-06 DIAGNOSIS — I5041 Acute combined systolic (congestive) and diastolic (congestive) heart failure: Secondary | ICD-10-CM

## 2021-07-06 LAB — HEMOGLOBIN A1C
Hgb A1c MFr Bld: 6.2 % — ABNORMAL HIGH (ref 4.8–5.6)
Mean Plasma Glucose: 131.24 mg/dL

## 2021-07-06 LAB — BASIC METABOLIC PANEL
Anion gap: 10 (ref 5–15)
BUN: 15 mg/dL (ref 6–20)
CO2: 23 mmol/L (ref 22–32)
Calcium: 8.9 mg/dL (ref 8.9–10.3)
Chloride: 103 mmol/L (ref 98–111)
Creatinine, Ser: 0.68 mg/dL (ref 0.44–1.00)
GFR, Estimated: >60 mL/min (ref >60)
Glucose, Bld: 213 mg/dL — ABNORMAL HIGH (ref 70–99)
Potassium: 4.4 mmol/L (ref 3.5–5.1)
Sodium: 136 mmol/L (ref 135–145)

## 2021-07-06 LAB — CBC WITH DIFFERENTIAL/PLATELET
Abs Immature Granulocytes: 0.09 10*3/uL — ABNORMAL HIGH (ref 0.00–0.07)
Basophils Absolute: 0.1 10*3/uL (ref 0.0–0.1)
Basophils Relative: 0 %
Eosinophils Absolute: 0 10*3/uL (ref 0.0–0.5)
Eosinophils Relative: 0 %
HCT: 36.9 % (ref 36.0–46.0)
Hemoglobin: 12 g/dL (ref 12.0–15.0)
Immature Granulocytes: 1 %
Lymphocytes Relative: 13 %
Lymphs Abs: 1.9 10*3/uL (ref 0.7–4.0)
MCH: 29.6 pg (ref 26.0–34.0)
MCHC: 32.5 g/dL (ref 30.0–36.0)
MCV: 91.1 fL (ref 80.0–100.0)
Monocytes Absolute: 1.4 10*3/uL — ABNORMAL HIGH (ref 0.1–1.0)
Monocytes Relative: 9 %
Neutro Abs: 11.8 10*3/uL — ABNORMAL HIGH (ref 1.7–7.7)
Neutrophils Relative %: 77 %
Platelets: 320 10*3/uL (ref 150–400)
RBC: 4.05 MIL/uL (ref 3.87–5.11)
RDW: 14.9 % (ref 11.5–15.5)
WBC: 15.3 10*3/uL — ABNORMAL HIGH (ref 4.0–10.5)
nRBC: 0 % (ref 0.0–0.2)

## 2021-07-06 LAB — MAGNESIUM: Magnesium: 1.8 mg/dL (ref 1.7–2.4)

## 2021-07-06 LAB — COOXEMETRY PANEL
Carboxyhemoglobin: 1.1 % (ref 0.5–1.5)
Methemoglobin: 0.3 % (ref 0.0–1.5)
O2 Saturation: 52.9 %
Total hemoglobin: 11.2 g/dL — ABNORMAL LOW (ref 12.0–16.0)

## 2021-07-06 LAB — GLUCOSE, CAPILLARY
Glucose-Capillary: 199 mg/dL — ABNORMAL HIGH (ref 70–99)
Glucose-Capillary: 226 mg/dL — ABNORMAL HIGH (ref 70–99)
Glucose-Capillary: 236 mg/dL — ABNORMAL HIGH (ref 70–99)
Glucose-Capillary: 220 mg/dL — ABNORMAL HIGH (ref 70–99)

## 2021-07-06 LAB — URINE CULTURE: Culture: NO GROWTH

## 2021-07-06 LAB — HEPARIN LEVEL (UNFRACTIONATED)
Heparin Unfractionated: 0.22 IU/mL — ABNORMAL LOW (ref 0.30–0.70)
Heparin Unfractionated: <0.1 IU/mL — ABNORMAL LOW (ref 0.30–0.70)
Heparin Unfractionated: 0.91 IU/mL — ABNORMAL HIGH (ref 0.30–0.70)

## 2021-07-06 LAB — BRAIN NATRIURETIC PEPTIDE: B Natriuretic Peptide: 1430.3 pg/mL — ABNORMAL HIGH (ref 0.0–100.0)

## 2021-07-06 LAB — LACTIC ACID, PLASMA: Lactic Acid, Venous: 1.9 mmol/L (ref 0.5–1.9)

## 2021-07-06 MED ORDER — NITROGLYCERIN 0.4 MG SL SUBL
SUBLINGUAL_TABLET | SUBLINGUAL | Status: AC
  Filled 2021-07-06: qty 1

## 2021-07-06 MED ORDER — HEPARIN BOLUS VIA INFUSION
1000.0000 [IU] | INTRAVENOUS | Status: AC
  Administered 2021-07-06: 1000 [IU] via INTRAVENOUS
  Filled 2021-07-06: qty 1000

## 2021-07-06 MED ORDER — HEPARIN BOLUS VIA INFUSION
2000.0000 [IU] | Freq: Once | INTRAVENOUS | Status: AC
  Administered 2021-07-06: 2000 [IU] via INTRAVENOUS
  Filled 2021-07-06: qty 2000

## 2021-07-06 MED ORDER — METOPROLOL TARTRATE 5 MG/5ML IV SOLN
5.0000 mg | Freq: Once | INTRAVENOUS | Status: DC
  Filled 2021-07-06: qty 5

## 2021-07-06 MED ORDER — MILRINONE LACTATE IN DEXTROSE 20-5 MG/100ML-% IV SOLN
0.2500 ug/kg/min | INTRAVENOUS | Status: DC
  Administered 2021-07-06: 0.25 ug/kg/min via INTRAVENOUS
  Filled 2021-07-06: qty 100

## 2021-07-06 MED ORDER — FUROSEMIDE 10 MG/ML IJ SOLN
40.0000 mg | Freq: Two times a day (BID) | INTRAMUSCULAR | Status: DC
  Administered 2021-07-06 – 2021-07-07 (×2): 40 mg via INTRAVENOUS
  Filled 2021-07-06 (×3): qty 4

## 2021-07-06 MED ORDER — NOREPINEPHRINE 4 MG/250ML-% IV SOLN
0.0000 ug/min | INTRAVENOUS | Status: DC
  Administered 2021-07-06: 7.5 ug/min via INTRAVENOUS
  Filled 2021-07-06: qty 250

## 2021-07-06 MED ORDER — METOPROLOL TARTRATE 5 MG/5ML IV SOLN
2.5000 mg | Freq: Once | INTRAVENOUS | Status: AC
  Administered 2021-07-06: 2.5 mg via INTRAVENOUS

## 2021-07-06 MED ORDER — METOPROLOL TARTRATE 25 MG PO TABS
12.5000 mg | ORAL_TABLET | Freq: Two times a day (BID) | ORAL | Status: DC
  Administered 2021-07-06 – 2021-07-07 (×2): 12.5 mg via ORAL
  Filled 2021-07-06 (×2): qty 1

## 2021-07-06 MED ORDER — MORPHINE SULFATE (PF) 2 MG/ML IV SOLN
2.0000 mg | INTRAVENOUS | Status: DC | PRN
  Administered 2021-07-06 – 2021-07-11 (×5): 2 mg via INTRAVENOUS
  Filled 2021-07-06 (×5): qty 1

## 2021-07-06 MED ORDER — GUAIFENESIN ER 600 MG PO TB12
600.0000 mg | ORAL_TABLET | Freq: Two times a day (BID) | ORAL | Status: DC
  Administered 2021-07-06 – 2021-07-07 (×3): 600 mg via ORAL
  Filled 2021-07-06 (×3): qty 1

## 2021-07-06 MED ORDER — NITROGLYCERIN 0.4 MG SL SUBL: 0.4000 mg | SUBLINGUAL_TABLET | SUBLINGUAL | Status: DC | PRN

## 2021-07-06 NOTE — Consult Note (Signed)
ANTICOAGULATION CONSULT NOTE  Pharmacy Consult for Heparin infusion Indication: pulmonary embolus  Patient Measurements: Heparin Dosing Weight: 77 kg   Labs: Recent Labs    07/04/21 1942 07/04/21 2138 07/05/21 0546 07/05/21 1305 07/05/21 2109 07/06/21 0725 07/06/21 2250  HGB 12.3  --  11.6*  --   --  12.0  --   HCT 39.0  --  36.5  --   --  36.9  --   PLT 372  --  216  --   --  320  --   APTT 28  --   --   --   --   --   --   LABPROT 14.0  --   --   --   --   --   --   INR 1.1  --   --   --   --   --   --   HEPARINUNFRC  --   --  0.21*   < > 0.22* <0.10* 0.91*  CREATININE 0.87  --  0.64  --   --  0.68  --   TROPONINIHS 101* 92*  --   --   --   --   --    < > = values in this interval not displayed.    Estimated Creatinine Clearance: 100.4 mL/min (by C-G formula based on SCr of 0.68 mg/dL).   Medical History: Past Medical History:  Diagnosis Date   Asthma    CAD (coronary artery disease)    Diabetes (HCC)    Hypertension    Medications:  No prior anticoagulation noted  Assessment: 50 y/o female with Hx of recent MI, presents with chest pain. CT positive for RLL PE without evidence of right heart strain. Pharmacy has been consulted for initiation and management of heparin infusion for PE.  Goal of Therapy:  Heparin level 0.3-0.7 units/ml Monitor platelets by anticoagulation protocol: Yes  7/15 0546 HL 0.21, subtherapeutic - 1350 units/hr 7/15 1305 HL 0.22, subtherapeutic - 1500 units/hr 7/15 2109 HL 0.22, subtherapeutic - 1650 units/hr 7/16 0725 HL <0.10, subtherapeutic -1800 units/hr 7/16 2250 HL 0.91, supratherapeutic   Plan:  --Heparin level is subtherapeutic --Will decrease heparin infusion rate to 1950 units/hr --Re-check HL 6 hours after rate change --Daily CBC per protocol while on IV heparin  Otelia Sergeant, PharmD, [facility] 07/06/2021 11:38 PM

## 2021-07-06 NOTE — Consult Note (Signed)
NAME:  Martha Ramos, MRN:  [id number], DOB:  1970/12/27, LOS: 2 ADMISSION DATE:  07/04/2021, CONSULTATION DATE:  07/06/2021 REFERRING MD:  Dr. Chipper Herb, CHIEF COMPLAINT:  Chest pain   History of Present Illness:  50 year old female presents to [facility] ED with complaints of chest pain.  Per ED documentation patient reported sudden onset of pressure in the center of her chest 2 hours prior to arrival at ED. she stated the pain has been constant and exacerbated when breathing deeply.  Since the onset of pain she reports palpitations, occasional wheezing. ED course: Patient noted to be tachycardic in the 140s on arrival, initial EKG with unclear rhythm.  IV fluids were initiated with no change in heart rate, IV metoprolol dropped her heart rate into the 120s.  At this point rhythm clearly sinus tachycardia.  CT angio of the chest shows right lower lobe Euler pulmonary embolism along with possible pneumonia.  IV heparin was initiated and empiric antibiotics given.  Labs work significant for leukocytosis, elevated troponin, hyperglycemia, UA positive for UTI.  EKG showing ST with LAD and poor R wave progression. TRH consulted for admission. Hospital course: Cardiology consulted due to ongoing tachycardia and acute on chronic heart failure with current LVEF significantly reduced at less than 20%.  Digoxin and metoprolol IVP administered with minimal effect. Milrinone drip initiated, but stopped shortly after by cardiology due to concerns of marginal blood pressures.  Levophed drip initiated.  Patient continues to be tachycardic sustaining 120s to 150s. PCCM consulted for central line placement as well as assistance with ongoing management. Pertinent  Medical History  HTN T2DM CAD Asthma Schizophrenia SVT  Significant Hospital Events: Including procedures, antibiotic start and stop dates in addition to other pertinent events   07/05/2021- Admit to SDU with Acute RLL PE and associated acute new onset  HFrEF 07/06/2021- Central line placed, PCCM consulted  Interim History / Subjective:  Patient c/o reproducible 8/10 left-sided chest pain.  Patient is diaphoretic and also complains of soreness over right IJ site.  Upon further evaluation patient describes this chest pain is improved from initial admission.  Morphine PRN administered with improvement in pain.  Labs/ Imaging personally reviewed I, Cheryll Cockayne Rust-Chester, AGACNP-BC, personally viewed and interpreted this ECG. EKG Interpretation Date: 07/06/2021 EKG Time: 19:28 Rate: 139 Rhythm: ST QRS Axis:  RAD Intervals: normal ST/T Wave abnormalities: Non-specific T wave inversions in lateral leads & III Narrative Interpretation: ST with RAD  Net: +1.1 L (+ 2.2 L since admit) Na+/ K+: 136/ 4.4 BUN/Cr.: 15/ 0.68 Serum CO2/ AG: 23/ 10  Hgb: 12.0 Troponin: (7/14) 101 > 92 BNP: 1430.3  WBC/ TMAX: 15.3/ 37.1 Lactic/ PCT: 3.2 > 2.6 > 1.9  Co-ox panel: 11.2/ 1.1/ 0.3/ 59.2 CT angio chest/abdomen/pelvis: Right lower lobe pulmonary artery embolus, mild cardiomegaly with evidence of right heart dysfunction.  Small bilateral pleural effusions with bibasilar atelectasis.  Small ascites and diffuse subcutaneous edema and anasarca.  Massive versus submassive PE according Echocardiogram 07/05/2021: LVEF severely reduced at less than 20%, no regional wall motion abnormalities, IVC severely dilated.  RV moderately enlarged, LA moderately dilated, RA moderately dilated.  Moderate MR Venous ultrasound bilateral lower extremities & bilateral upper extremities 07/05/2021: All negative for DVT CXR 07/06/21: improved aeration in RLL, possible interstitial edema, possible LLL small effusion (challenging assessment due to cardiomegaly)  Objective   Blood pressure 131/83, pulse 68, temperature 98.7 F (37.1 C), temperature source Oral, resp. rate (!) 32, height 5\' 4"  (1.626 m), weight 106.9 kg, SpO2  97 %. CVP:  [20 mmHg-30 mmHg] 21 mmHg       Intake/Output Summary (Last 24 hours) at 07/06/2021 1923 Last data filed at 07/06/2021 1906 Gross per 24 hour  Intake 1784.9 ml  Output 452 ml  Net 1332.9 ml   Filed Weights   07/04/21 1912 07/05/21 0200 07/06/21 0139  Weight: 97.1 kg 103.9 kg 106.9 kg    Examination: General: Adult female, critically ill, lying in bed, diaphoretic  HEENT: MM pink/moist, anicteric, atraumatic, neck supple Neuro: A&O x 4, able to follow commands, PERRL +3, MAE CV: s1s2, regular, ST and 130s on monitor, +pleural friction rub no m/g Pulm: Regular, mildly dyspneic on room air, breath sounds diminished-BUL & fine crackles-BLL GI: soft, rounded, mildly tender to palpation, bs x 4 GU: external catheter in place with clear amber urine Skin: no rashes/lesions noted Extremities: warm/dry, pulses + 2 R/P, trace edema noted Resolved Hospital Problem list   UTI- ruled out  Assessment & Plan:  Acute right lower lobe pulmonary embolism  Elevated troponin secondary to demand ischemia from PE -Continue systemic Heparin drip per pharmacy protocol - Supplemental O2 to maintain SpO2 > 90% - morphine IV for pain control  Acute HFrEF in the setting of chronic HFpEF RV failure in the setting of acute pulmonary embolism Sinus tachycardia Milrinone drip has been initiated by cardiology and then stopped for concerns over marginal blood pressures & Levophed drip initiated.  Paged cardiology to clarify plan of care, at this point have not heard back.  Concerns regarding Levophed in the setting of sinus tachycardia with sustained rate greater than 130.  BP has been stable throughout the day.  Discussed case with Dr. Belia Heman who agrees with plan of care -Initiate furosemide 40 mg BID, consider restarting milrinone in a.m. per cardiology -Discontinue Levophed drip -Continuous CVP monitoring -Will consider low-dose metoprolol if diuresis does not drop BP and HR continues to sustain > 120 - strict I&O's -Continuous cardiac  monitoring -Cardiology following appreciate input  Type 2 Diabetes Mellitus Hemoglobin A1C: 6.2 - Monitor CBG Q AC, HS - SSI changed to moderate dosing - continue lantus 12 units - target range while in ICU: 140-180 - follow ICU hyper/hypo-glycemia protocol - Diabetes Coordinator following, appreciate input  Best Practice (right click and "Reselect all SmartList Selections" daily)  Diet/type: Regular consistency (see orders) DVT prophylaxis: systemic heparin GI prophylaxis: PPI Lines: Central line and yes and it is still needed Foley:  N/A Code Status:  full code Last date of multidisciplinary goals of care discussion- per primary service  Labs   CBC: Recent Labs  Lab 07/04/21 1942 07/05/21 0546 07/06/21 0725  WBC 20.7* 14.6* 15.3*  NEUTROABS 17.6*  --  11.8*  HGB 12.3 11.6* 12.0  HCT 39.0 36.5 36.9  MCV 92.2 91.0 91.1  PLT 372 216 320    Basic Metabolic Panel: Recent Labs  Lab 07/04/21 1942 07/05/21 0546 07/06/21 0725  NA 136 137 136  K 3.9 4.1 4.4  CL 103 106 103  CO2 22 24 23   GLUCOSE 339* 266* 213*  BUN 13 12 15   CREATININE 0.87 0.64 0.68  CALCIUM 8.9 8.6* 8.9  MG 1.4* 1.8 1.8   GFR: Estimated Creatinine Clearance: 100.4 mL/min (by C-G formula based on SCr of 0.68 mg/dL). Recent Labs  Lab 07/04/21 1942 07/05/21 0037 07/05/21 0546 07/06/21 0725 07/06/21 0837  PROCALCITON  --   --  0.23  --   --   WBC 20.7*  --  14.6* 15.3*  --  LATICACIDVEN 3.2* 2.6*  --   --  1.9    Liver Function Tests: Recent Labs  Lab 07/04/21 1942 07/05/21 0546  AST 39 20  ALT 49* 40  ALKPHOS 65 58  BILITOT 0.8 0.8  PROT 6.6 6.3*  ALBUMIN 3.3* 3.0*   Recent Labs  Lab 07/04/21 1942  LIPASE 34   No results for input(s): AMMONIA in the last 168 hours.  ABG No results found for: PHART, PCO2ART, PO2ART, HCO3, TCO2, ACIDBASEDEF, O2SAT   Coagulation Profile: Recent Labs  Lab 07/04/21 1942  INR 1.1    Cardiac Enzymes: No results for input(s): CKTOTAL,  CKMB, CKMBINDEX, TROPONINI in the last 168 hours.  HbA1C: Hgb A1c MFr Bld  Date/Time Value Ref Range Status  07/05/2021 09:37 AM 6.2 (H) 4.8 - 5.6 % Final    Comment:    (NOTE) Pre diabetes:          5.7%-6.4%  Diabetes:              >6.4%  Glycemic control for   <7.0% adults with diabetes     CBG: Recent Labs  Lab 07/05/21 1630 07/05/21 2130 07/06/21 0720 07/06/21 1107 07/06/21 1510  GLUCAP 102* 130* 199* 226* 236*    Review of Systems: Positives in BOLD  Gen: Denies fever, chills, weight change, fatigue, night sweats HEENT: Denies blurred vision, double vision, hearing loss, tinnitus, sinus congestion, rhinorrhea, sore throat, neck stiffness, dysphagia PULM: Denies shortness of breath, cough, sputum production, hemoptysis, wheezing CV: Denies chest pain, edema, orthopnea, paroxysmal nocturnal dyspnea, palpitations GI: Denies mild abdominal pain, nausea, vomiting, diarrhea, hematochezia, melena, constipation, change in bowel habits GU: Denies dysuria, hematuria, polyuria, oliguria, urethral discharge Endocrine: Denies hot or cold intolerance, polyuria, polyphagia or appetite change Derm: Denies rash, dry skin, scaling or peeling skin change Heme: Denies easy bruising, bleeding, bleeding gums Neuro: Denies headache, numbness, weakness, slurred speech, loss of memory or consciousness  Past Medical History:  She,  has a past medical history of Asthma, CAD (coronary artery disease), Diabetes (HCC), and Hypertension.   Surgical History:  History reviewed. No pertinent surgical history.   Social History:      Family History:  Her family history includes Alcohol abuse in her father; Heart failure in her mother.   Allergies No Known Allergies   Home Medications  Prior to Admission medications   Medication Sig Start Date End Date Taking? Authorizing Provider  atorvastatin (LIPITOR) 20 MG tablet Take 1 tablet by mouth daily.   Yes [provider]  DULoxetine  (CYMBALTA) 60 MG capsule Take 60 mg by mouth daily. 05/21/21  Yes [provider]  glipiZIDE (GLUCOTROL) 5 MG tablet Take 5 mg by mouth 2 (two) times daily. 04/17/21  Yes [provider]  haloperidol (HALDOL) 10 MG tablet Take 10 mg by mouth at bedtime. 05/15/21  Yes [provider]  K-PHOS 500 MG tablet Take 500 mg by mouth 2 (two) times daily. 06/25/21  Yes [provider]  metFORMIN (GLUCOPHAGE) 500 MG tablet Take 500 mg by mouth 2 (two) times daily. 04/17/21  Yes [provider]  omeprazole (PRILOSEC) 20 MG capsule Take 20 mg by mouth 2 (two) times daily. 05/03/21  Yes [provider]  potassium chloride SA (KLOR-CON) 20 MEQ tablet Take 20 mEq by mouth daily. 06/20/21  Yes [provider]  VENTOLIN HFA 108 (90 Base) MCG/ACT inhaler Inhale 1-2 puffs into the lungs every 4 (four) hours as needed. 04/17/21  Yes [provider]  Critical care time: 45 minutes       Betsey Holiday, AGACNP-BC Acute Care Nurse Practitioner [facility]   [phone] / [phone] Please see Amion for pager details.

## 2021-07-06 NOTE — Progress Notes (Signed)
Triple lumen central line place by Dr Belia Heman without any complications. Heparin gtt restarted at previous rate of 2100units/52mls /hour .

## 2021-07-06 NOTE — Consult Note (Signed)
ANTICOAGULATION CONSULT NOTE  Pharmacy Consult for Heparin infusion Indication: pulmonary embolus  Patient Measurements: Heparin Dosing Weight: 77 kg   Labs: Recent Labs    07/04/21 1942 07/04/21 2138 07/05/21 0546 07/05/21 1305 07/05/21 2109  HGB 12.3  --  11.6*  --   --   HCT 39.0  --  36.5  --   --   PLT 372  --  216  --   --   APTT 28  --   --   --   --   LABPROT 14.0  --   --   --   --   INR 1.1  --   --   --   --   HEPARINUNFRC  --   --  0.21* 0.22* 0.22*  CREATININE 0.87  --  0.64  --   --   TROPONINIHS 101* 92*  --   --   --     Estimated Creatinine Clearance: 100.4 mL/min (by C-G formula based on SCr of 0.64 mg/dL).   Medical History: Past Medical History:  Diagnosis Date   Asthma    CAD (coronary artery disease)    Diabetes (HCC)    Hypertension    Medications:  No prior anticoagulation noted  Assessment: 50 y/o female with Hx of recent MI, presents with chest pain. CT positive for RLL PE without evidence of right heart strain. Pharmacy has been consulted for initiation and management of heparin infusion for PE.  Goal of Therapy:  Heparin level 0.3-0.7 units/ml Anti-Xa level 0.6-1 units/ml 4hrs after LMWH dose given Monitor platelets by anticoagulation protocol: Yes  7/15 0546 HL 0.21, subtherapeutic - 1350 units/hr 7/15 1305 HL 0.22, subtherapeutic - 1500 units/hr 7/15 2109 HL 0.22, subtherapeutic   Plan:  --Heparin level is subtherapeutic --Will bolus heparin 1000 units x 1 and increase heparin infusion rate to 1800 units/hr --Re-check HL 6 hours after rate change --Daily CBC per protocol while on IV heparin  Otelia Sergeant, PharmD, [facility] 07/06/2021 1:58 AM

## 2021-07-06 NOTE — Progress Notes (Signed)
Spoke with [facility] RN re PICC order.  States Dr Belia Heman has placed a CVC and will cancel PICC order.

## 2021-07-06 NOTE — Consult Note (Signed)
Cardiology Consultation:   Patient ID: Martha Ramos MRN: [id number]; DOB: 1971/07/06  Admit date: 07/04/2021 Date of Consult: 07/06/2021  PCP:  Darnelle Spangle, MD   [facility] Providers Cardiologist:  None        Patient Profile:   Martha Ramos is a 50 y.o. female with a hx of diabetes, hypertension, coronary artery disease, bipolar disorder, and asthma admitted with chest pain who is being seen 07/06/2021 for the evaluation of acute systolic and diastolic heart failure and acute right lower lobe pulmonary embolism at the request of Dr. Chipper Herb.  History of Present Illness:   Martha Ramos presented 7/14 with acute onset of chest pain and tightness as well as shortness of breath.  In the ED she was hypotensive to 99/83 with a heart rate of 161.  She was tachypneic to 33 respirations per minute.  She was not hypoxic on room air.  BNP was elevated to 1128.  High-sensitivity troponin was elevated to 101.  She was negative for influenza and COVID-19.  Chest CTA was notable for a right lower lobe pulmonary embolism with evidence of right heart dysfunction.  She did have a small amount of ascites and diffuse subcutaneous edema and anasarca.  Echocardiogram revealed LVEF less than 20% with severe dilatation.  LVIDd was 6.1 cm.  The right ventricle was moderately enlarged and systolic function was severely reduced.  She had moderate tricuspid regurgitation.  On my review of her echo, right atrial pressure was 15 mmHg.  There was also concern for pneumonia and UTI with sepsis.  WBC was 20.7.  She was started on ceftriaxone and azithromycin.  She was persistently hypotensive and tachycardic.  Blood pressures dropped with attempts at diuresis.  Cardiology was consulted and she was started on digoxin.  Lactate was initially 3.2 and has improved to 1.9.  Utox was positive for THC but otherwise negative.    She currently reports 9 out of 10 substernal chest pain that is pleuritic.  It just  started within the last hour.  She notes that her mother had heart failure and died of this in her 74s.  She thinks that she had heart failure for many years before she died.  She has no other family members with heart failure and denies any sudden cardiac death history.  She notes that prior to her current episode she has not had any recent chest pain or shortness of breath.  She denies lower extremity edema.  She denies any recent viral illnesses.  She was seen in the ED at [facility] 03/2021.  High-sensitivity troponin at that time was minimally elevated and peaked at 46.  She reports a hisotry of prior MI at [facility], though I don't see documentation of this.  On my review of her chest CT-A this admission, she does not have any stents or significant coronary calcification.  She had an echo 03/2020 at [facility] that revealed LV EF 50 to 55% with normal right ventricular function.  Past Medical History:  Diagnosis Date   Asthma    CAD (coronary artery disease)    Diabetes (HCC)    Hypertension     History reviewed. No pertinent surgical history.   Home Medications:  Prior to Admission medications   Medication Sig Start Date End Date Taking? Authorizing Provider  atorvastatin (LIPITOR) 20 MG tablet Take 1 tablet by mouth daily.   Yes [provider]  DULoxetine (CYMBALTA) 60 MG capsule Take 60 mg by mouth daily. 05/21/21  Yes [provider]  glipiZIDE (GLUCOTROL) 5 MG tablet Take 5 mg by mouth 2 (two) times daily. 04/17/21  Yes [provider]  haloperidol (HALDOL) 10 MG tablet Take 10 mg by mouth at bedtime. 05/15/21  Yes [provider]  K-PHOS 500 MG tablet Take 500 mg by mouth 2 (two) times daily. 06/25/21  Yes [provider]  metFORMIN (GLUCOPHAGE) 500 MG tablet Take 500 mg by mouth 2 (two) times daily. 04/17/21  Yes [provider]  omeprazole (PRILOSEC) 20 MG capsule Take 20 mg by mouth 2 (two) times daily. 05/03/21  Yes [provider]  potassium  chloride SA (KLOR-CON) 20 MEQ tablet Take 20 mEq by mouth daily. 06/20/21  Yes [provider]  VENTOLIN HFA 108 (90 Base) MCG/ACT inhaler Inhale 1-2 puffs into the lungs every 4 (four) hours as needed. 04/17/21  Yes [provider]    Inpatient Medications: Scheduled Meds:  atorvastatin  20 mg Oral Daily   Chlorhexidine Gluconate Cloth  6 each Topical Q0600   DULoxetine  60 mg Oral Daily   glipiZIDE  5 mg Oral BID AC   guaiFENesin  600 mg Oral BID   haloperidol  10 mg Oral QHS   insulin aspart  0-9 Units Subcutaneous TID WC   insulin glargine  12 Units Subcutaneous QHS   mouth rinse  15 mL Mouth Rinse BID   pantoprazole  40 mg Oral Daily   potassium chloride SA  20 mEq Oral Daily   potassium phosphate (monobasic)  500 mg Oral BID   Continuous Infusions:  heparin 1,800 Units/hr (07/06/21 0945)   PRN Meds: acetaminophen **OR** acetaminophen, albuterol, magnesium hydroxide, morphine injection, ondansetron **OR** ondansetron (ZOFRAN) IV, traZODone  Allergies:   No Known Allergies  Social History:   Social History   Socioeconomic History   Marital status: Single    Spouse name: Not on file   Number of children: Not on file   Years of education: Not on file   Highest education level: Not on file  Occupational History   Not on file  Tobacco Use   Smoking status: Not on file   Smokeless tobacco: Not on file  Substance and Sexual Activity   Alcohol use: Not on file   Drug use: Not on file   Sexual activity: Not on file  Other Topics Concern   Not on file  Social History Narrative   Not on file   Social Determinants of Health   Financial Resource Strain: Not on file  Food Insecurity: Not on file  Transportation Needs: Not on file  Physical Activity: Not on file  Stress: Not on file  Social Connections: Not on file  Intimate Partner Violence: Not on file    Family History:   Family History  Problem Relation Age of Onset   Heart failure Mother     Alcohol abuse Father      ROS:  Please see the history of present illness.   All other ROS reviewed and negative.     Physical Exam/Data:   Vitals:   07/06/21 0300 07/06/21 0800 07/06/21 1000 07/06/21 1100  BP: 95/80 101/78 (!) 84/65 95/60  Pulse: (!) 110 (!) 105 64 (!) 128  Resp: (!) 30 20 (!) 27 (!) 36  Temp:  98.7 F (37.1 C)    TempSrc:  Oral    SpO2: 100% 95% 100% 100%  Weight:      Height:        Intake/Output Summary (Last 24 hours) at 07/06/2021  1144 Last data filed at 07/06/2021 0945 Gross per 24 hour  Intake 1235.81 ml  Output 800 ml  Net 435.81 ml   Last 3 Weights 07/06/2021 07/05/2021 07/04/2021  Weight (lbs) 235 lb 10.8 oz 229 lb 0.9 oz 214 lb  Weight (kg) 106.9 kg 103.9 kg 97.07 kg     Body mass index is 40.45 kg/m.  General:  Well nourished, well developed.  Diaphoretic and tachypneic.  Ill-appearing. HEENT: normal Lymph: no adenopathy Neck: JVP 12 cm.  +HJR Endocrine:  No thryomegaly Vascular: No carotid bruits; FA pulses 2+ bilaterally without bruits  Cardiac:  normal S1, S2; tachycardic.  Regular rhythm.  No murmur  Lungs:  clear to auscultation bilaterally, no wheezing, rhonchi or rales  Abd: soft, nontender, no hepatomegaly  Ext: trace LE edema Musculoskeletal:  No deformities, BUE and BLE strength normal and equal Skin: warm and dry  Neuro:  CNs 2-12 intact, no focal abnormalities noted Psych:  Normal affect   EKG:  The EKG was personally reviewed and demonstrates:   sinus tachycardia.  Rate 157 bpm.  Left axis deviation. Telemetry:  Telemetry was personally reviewed and demonstrates:  sinus tachycardia.  Rate 110-140s  Relevant CV Studies: Echo 07/05/21: 1. Severely dilated systolic cardiomyopathy.   2. Left ventricular ejection fraction, by estimation, is <20%. The left  ventricle has severely decreased function. The left ventricle has no  regional wall motion abnormalities. The left ventricular internal cavity  size was severely dilated.  Left  ventricular diastolic parameters were normal.   3. Right ventricular systolic function is severely reduced. The right  ventricular size is moderately enlarged.   4. Left atrial size was moderately dilated.   5. Right atrial size was moderately dilated.   6. The mitral valve is grossly normal. Moderate mitral valve  regurgitation.   7. Tricuspid valve regurgitation is moderate.   8. The aortic valve is grossly normal. Aortic valve regurgitation is not  visualized.    [facility] 03/2020: 1. Technically difficult study due to chest wall/lung interference.    2. The left ventricle is upper normal in size with normal wall thickness.    3. The left ventricular systolic function is overall normal, LVEF is  visually estimated at 50-55%.    4. The right ventricle is normal in size, with low normal systolic function.    5. No significant valvular abnormalities.    Laboratory Data:  High Sensitivity Troponin:   Recent Labs  Lab 07/04/21 1942 07/04/21 2138  TROPONINIHS 101* 92*     Chemistry Recent Labs  Lab 07/04/21 1942 07/05/21 0546 07/06/21 0725  NA 136 137 136  K 3.9 4.1 4.4  CL 103 106 103  CO2 22 24 23   GLUCOSE 339* 266* 213*  BUN 13 12 15   CREATININE 0.87 0.64 0.68  CALCIUM 8.9 8.6* 8.9  GFRNONAA >60 >60 >60  ANIONGAP 11 7 10     Recent Labs  Lab 07/04/21 1942 07/05/21 0546  PROT 6.6 6.3*  ALBUMIN 3.3* 3.0*  AST 39 20  ALT 49* 40  ALKPHOS 65 58  BILITOT 0.8 0.8   Hematology Recent Labs  Lab 07/04/21 1942 07/05/21 0546 07/06/21 0725  WBC 20.7* 14.6* 15.3*  RBC 4.23 4.01 4.05  HGB 12.3 11.6* 12.0  HCT 39.0 36.5 36.9  MCV 92.2 91.0 91.1  MCH 29.1 28.9 29.6  MCHC 31.5 31.8 32.5  RDW 14.6 14.8 14.9  PLT 372 216 320   BNP Recent Labs  Lab 07/04/21 1942 07/05/21 0546  07/06/21 0837  BNP 1,128.3* 1,356.8* 1,430.3*    DDimer No results for input(s): DDIMER in the last 168 hours.   Radiology/Studies:  CT Angio Chest PE W/Cm &/Or Wo Cm  Result  Date: 07/04/2021 CLINICAL DATA:  50 year old female with abdominal pain and chest pain. EXAM: CT ANGIOGRAPHY CHEST CT ABDOMEN AND PELVIS WITH CONTRAST TECHNIQUE: Multidetector CT imaging of the chest was performed using the standard protocol during bolus administration of intravenous contrast. Multiplanar CT image reconstructions and MIPs were obtained to evaluate the vascular anatomy. Multidetector CT imaging of the abdomen and pelvis was performed using the standard protocol during bolus administration of intravenous contrast. CONTRAST:  OMNIPAQUE IOHEXOL 350 MG/ML SOLN COMPARISON:  Chest radiograph dated 07/04/2021. FINDINGS: CTA CHEST FINDINGS Cardiovascular: There is mild cardiomegaly. There is retrograde flow of contrast from the right atrium into the IVC suggestive of right heart dysfunction. Correlation with echocardiogram recommended. No pericardial effusion. The thoracic aorta is unremarkable. Pulmonary artery embolus noted at the right lower lobe lobar branch point (145/5). No CT evidence of right heart straining. Mediastinum/Nodes: There is no hilar or mediastinal adenopathy. The esophagus and the thyroid gland are grossly unremarkable. No mediastinal fluid collection. Lungs/Pleura: Small bilateral pleural effusions with bibasilar atelectasis. Pneumonia is not excluded clinical correlation is recommended. No pneumothorax. The central airways are patent. Musculoskeletal: Diffuse subcutaneous edema. No fluid collection. No acute osseous pathology. Review of the MIP images confirms the above findings. CT ABDOMEN and PELVIS FINDINGS No intra-abdominal free air. Small ascites. Hepatobiliary: No focal liver abnormality is seen. No gallstones, gallbladder wall thickening, or biliary dilatation. Pancreas: Unremarkable. No pancreatic ductal dilatation or surrounding inflammatory changes. Spleen: Normal in size without focal abnormality. Adrenals/Urinary Tract: The adrenal glands are unremarkable. There is  no hydronephrosis and side. There is symmetric enhancement and excretion of contrast by both kidneys. The visualized ureters and the urinary bladder appear unremarkable. Stomach/Bowel: There is no bowel obstruction or active inflammation. The appendix is normal. Vascular/Lymphatic: Mild aortoiliac atherosclerotic disease. The IVC is unremarkable. No portal venous gas. There is no adenopathy. Reproductive: Hysterectomy. No adnexal masses. Other: Diffuse subcutaneous edema. Small fat containing umbilical hernia. Musculoskeletal: No acute or significant osseous findings. Review of the MIP images confirms the above findings. IMPRESSION: 1. Right lower lobe lobar pulmonary artery embolus. No CT evidence of right heart straining. 2. Mild cardiomegaly with evidence of right heart dysfunction. Correlation with echocardiogram recommended. 3. Small bilateral pleural effusions with bibasilar atelectasis. Pneumonia is not excluded. Clinical correlation is recommended. 4. Small ascites and diffuse subcutaneous edema and anasarca. 5. No bowel obstruction. Normal appendix. 6. Aortic Atherosclerosis (ICD10-I70.0). These results were called by telephone at the time of interpretation on 07/04/2021 at 9:31 pm to provider [facility] , who verbally acknowledged these results. Electronically Signed   By: Elgie Collard M.D.   On: 07/04/2021 21:34   CT Abdomen Pelvis W Contrast  Result Date: 07/04/2021 CLINICAL DATA:  50 year old female with abdominal pain and chest pain. EXAM: CT ANGIOGRAPHY CHEST CT ABDOMEN AND PELVIS WITH CONTRAST TECHNIQUE: Multidetector CT imaging of the chest was performed using the standard protocol during bolus administration of intravenous contrast. Multiplanar CT image reconstructions and MIPs were obtained to evaluate the vascular anatomy. Multidetector CT imaging of the abdomen and pelvis was performed using the standard protocol during bolus administration of intravenous contrast. CONTRAST:   OMNIPAQUE IOHEXOL 350 MG/ML SOLN COMPARISON:  Chest radiograph dated 07/04/2021. FINDINGS: CTA CHEST FINDINGS Cardiovascular: There is mild cardiomegaly. There is retrograde  flow of contrast from the right atrium into the IVC suggestive of right heart dysfunction. Correlation with echocardiogram recommended. No pericardial effusion. The thoracic aorta is unremarkable. Pulmonary artery embolus noted at the right lower lobe lobar branch point (145/5). No CT evidence of right heart straining. Mediastinum/Nodes: There is no hilar or mediastinal adenopathy. The esophagus and the thyroid gland are grossly unremarkable. No mediastinal fluid collection. Lungs/Pleura: Small bilateral pleural effusions with bibasilar atelectasis. Pneumonia is not excluded clinical correlation is recommended. No pneumothorax. The central airways are patent. Musculoskeletal: Diffuse subcutaneous edema. No fluid collection. No acute osseous pathology. Review of the MIP images confirms the above findings. CT ABDOMEN and PELVIS FINDINGS No intra-abdominal free air. Small ascites. Hepatobiliary: No focal liver abnormality is seen. No gallstones, gallbladder wall thickening, or biliary dilatation. Pancreas: Unremarkable. No pancreatic ductal dilatation or surrounding inflammatory changes. Spleen: Normal in size without focal abnormality. Adrenals/Urinary Tract: The adrenal glands are unremarkable. There is no hydronephrosis and side. There is symmetric enhancement and excretion of contrast by both kidneys. The visualized ureters and the urinary bladder appear unremarkable. Stomach/Bowel: There is no bowel obstruction or active inflammation. The appendix is normal. Vascular/Lymphatic: Mild aortoiliac atherosclerotic disease. The IVC is unremarkable. No portal venous gas. There is no adenopathy. Reproductive: Hysterectomy. No adnexal masses. Other: Diffuse subcutaneous edema. Small fat containing umbilical hernia. Musculoskeletal: No acute or  significant osseous findings. Review of the MIP images confirms the above findings. IMPRESSION: 1. Right lower lobe lobar pulmonary artery embolus. No CT evidence of right heart straining. 2. Mild cardiomegaly with evidence of right heart dysfunction. Correlation with echocardiogram recommended. 3. Small bilateral pleural effusions with bibasilar atelectasis. Pneumonia is not excluded. Clinical correlation is recommended. 4. Small ascites and diffuse subcutaneous edema and anasarca. 5. No bowel obstruction. Normal appendix. 6. Aortic Atherosclerosis (ICD10-I70.0). These results were called by telephone at the time of interpretation on 07/04/2021 at 9:31 pm to provider [facility] , who verbally acknowledged these results. Electronically Signed   By: Elgie Collard M.D.   On: 07/04/2021 21:34   US Venous Img Lower Bilateral (DVT)  Result Date: 07/05/2021 CLINICAL DATA:  50 year old female with PE. Evaluate for residual DVT. EXAM: BILATERAL LOWER EXTREMITY VENOUS DOPPLER ULTRASOUND TECHNIQUE: Gray-scale sonography with graded compression, as well as color Doppler and duplex ultrasound were performed to evaluate the lower extremity deep venous systems from the level of the common femoral vein and including the common femoral, femoral, profunda femoral, popliteal and calf veins including the posterior tibial, peroneal and gastrocnemius veins when visible. The superficial great saphenous vein was also interrogated. Spectral Doppler was utilized to evaluate flow at rest and with distal augmentation maneuvers in the common femoral, femoral and popliteal veins. COMPARISON:  None. FINDINGS: RIGHT LOWER EXTREMITY Common Femoral Vein: No evidence of thrombus. Normal compressibility, respiratory phasicity and response to augmentation. Saphenofemoral Junction: No evidence of thrombus. Normal compressibility and flow on color Doppler imaging. Profunda Femoral Vein: No evidence of thrombus. Normal compressibility and flow  on color Doppler imaging. Femoral Vein: No evidence of thrombus. Normal compressibility, respiratory phasicity and response to augmentation. Popliteal Vein: No evidence of thrombus. Normal compressibility, respiratory phasicity and response to augmentation. Calf Veins: No evidence of thrombus. Normal compressibility and flow on color Doppler imaging. Superficial Great Saphenous Vein: No evidence of thrombus. Normal compressibility. Venous Reflux:  None. Other Findings:  None. LEFT LOWER EXTREMITY Common Femoral Vein: No evidence of thrombus. Normal compressibility, respiratory phasicity and response to augmentation. Saphenofemoral Junction: No evidence  of thrombus. Normal compressibility and flow on color Doppler imaging. Profunda Femoral Vein: No evidence of thrombus. Normal compressibility and flow on color Doppler imaging. Femoral Vein: No evidence of thrombus. Normal compressibility, respiratory phasicity and response to augmentation. Popliteal Vein: No evidence of thrombus. Normal compressibility, respiratory phasicity and response to augmentation. Calf Veins: No evidence of thrombus. Normal compressibility and flow on color Doppler imaging. Superficial Great Saphenous Vein: No evidence of thrombus. Normal compressibility. Venous Reflux:  None. Other Findings:  None. IMPRESSION: No evidence of deep venous thrombosis in either lower extremity. Electronically Signed   By: Malachy Moan M.D.   On: 07/05/2021 11:04   US Venous Img Upper Uni Right(DVT)  Result Date: 07/05/2021 CLINICAL DATA:  Pulmonary embolus, right upper extremity pain and edema EXAM: RIGHT UPPER EXTREMITY VENOUS DOPPLER ULTRASOUND TECHNIQUE: Gray-scale sonography with graded compression, as well as color Doppler and duplex ultrasound were performed to evaluate the upper extremity deep venous system from the level of the subclavian vein and including the jugular, axillary, basilic, radial, ulnar and upper cephalic vein. Spectral Doppler  was utilized to evaluate flow at rest and with distal augmentation maneuvers. COMPARISON:  None. FINDINGS: Contralateral Subclavian Vein: Respiratory phasicity is normal and symmetric with the symptomatic side. No evidence of thrombus. Normal compressibility. Internal Jugular Vein: No evidence of thrombus. Normal compressibility, respiratory phasicity and response to augmentation. Subclavian Vein: No evidence of thrombus. Normal compressibility, respiratory phasicity and response to augmentation. Axillary Vein: No evidence of thrombus. Normal compressibility, respiratory phasicity and response to augmentation. Cephalic Vein: No evidence of thrombus. Normal compressibility, respiratory phasicity and response to augmentation. Basilic Vein: No evidence of thrombus. Normal compressibility, respiratory phasicity and response to augmentation. Brachial Veins: No evidence of thrombus. Normal compressibility, respiratory phasicity and response to augmentation. Radial Veins: No evidence of thrombus. Normal compressibility, respiratory phasicity and response to augmentation. Ulnar Veins: No evidence of thrombus. Normal compressibility, respiratory phasicity and response to augmentation. IMPRESSION: No evidence of DVT within the right upper extremity. Electronically Signed   By: Judie Petit.  Shick M.D.   On: 07/05/2021 12:33   DG Chest Portable 1 View  Result Date: 07/04/2021 CLINICAL DATA:  Chest pain EXAM: PORTABLE CHEST 1 VIEW COMPARISON:  None. FINDINGS: Cardiac shadow is enlarged but somewhat accentuated by the portable technique. Small bilateral pleural effusions are noted right greater than left. Likely right basilar atelectasis is present as well. No bony abnormality is seen. IMPRESSION: Small effusions bilaterally with likely right basilar atelectasis. Electronically Signed   By: Alcide Clever M.D.   On: 07/04/2021 20:29   ECHOCARDIOGRAM COMPLETE  Result Date: 07/05/2021    ECHOCARDIOGRAM REPORT   Patient Name:    Martha Ramos Date of Exam: 07/05/2021 Medical Rec #:  [id number]         Height:       64.0 in Accession #:    [id number]        Weight:       288.6 lb Date of Birth:  01-Feb-1971         BSA:          2.286 m Patient Age:    49 years          BP:           102/77 mmHg Patient Gender: F                 HR:           108 bpm. Exam Location:  [facility]  Procedure: 2D Echo, Color Doppler, Cardiac Doppler and Intracardiac            Opacification Agent Indications:     I26.09 Pulmonary Embolus  History:         Patient has no prior history of Echocardiogram examinations.                  CAD; Risk Factors:Hypertension and Diabetes. Asthma.  Sonographer:     Humphrey Rolls RDCS (AE) Referring Phys:  [id number] Vernetta Honey MANSY Diagnosing Phys: Alwyn Pea MD IMPRESSIONS  1. Severely dilated systolic cardiomyopathy.  2. Left ventricular ejection fraction, by estimation, is <20%. The left ventricle has severely decreased function. The left ventricle has no regional wall motion abnormalities. The left ventricular internal cavity size was severely dilated. Left ventricular diastolic parameters were normal.  3. Right ventricular systolic function is severely reduced. The right ventricular size is moderately enlarged.  4. Left atrial size was moderately dilated.  5. Right atrial size was moderately dilated.  6. The mitral valve is grossly normal. Moderate mitral valve regurgitation.  7. Tricuspid valve regurgitation is moderate.  8. The aortic valve is grossly normal. Aortic valve regurgitation is not visualized. FINDINGS  Left Ventricle: Left ventricular ejection fraction, by estimation, is <20%. The left ventricle has severely decreased function. The left ventricle has no regional wall motion abnormalities. Definity contrast agent was given IV to delineate the left ventricular endocardial borders. The left ventricular internal cavity size was severely dilated. There is no left ventricular hypertrophy. Left ventricular diastolic  parameters were normal. Right Ventricle: The right ventricular size is moderately enlarged. No increase in right ventricular wall thickness. Right ventricular systolic function is severely reduced. Left Atrium: Left atrial size was moderately dilated. Right Atrium: Right atrial size was moderately dilated. Pericardium: There is no evidence of pericardial effusion. Mitral Valve: The mitral valve is grossly normal. There is mild thickening of the mitral valve leaflet(s). Normal mobility of the mitral valve leaflets. Mild mitral annular calcification. Moderate mitral valve regurgitation. MV peak gradient, 4.0 mmHg. The mean mitral valve gradient is 2.0 mmHg. Tricuspid Valve: The tricuspid valve is normal in structure. Tricuspid valve regurgitation is moderate. Aortic Valve: The aortic valve is grossly normal. Aortic valve regurgitation is not visualized. Aortic valve mean gradient measures 1.0 mmHg. Aortic valve peak gradient measures 2.7 mmHg. Aortic valve area, by VTI measures 2.06 cm. Pulmonic Valve: The pulmonic valve was normal in structure. Pulmonic valve regurgitation is mild to moderate. Aorta: The ascending aorta was not well visualized. IAS/Shunts: No atrial level shunt detected by color flow Doppler. Additional Comments: Severely dilated systolic cardiomyopathy.  LEFT VENTRICLE PLAX 2D LVIDd:         6.10 cm      Diastology LVIDs:         5.50 cm      LV e' medial:    6.20 cm/s LV PW:         1.20 cm      LV E/e' medial:  15.0 LV IVS:        0.90 cm      LV e' lateral:   6.64 cm/s LVOT diam:     2.00 cm      LV E/e' lateral: 14.0 LV SV:         22 LV SV Index:   10 LVOT Area:     3.14 cm  LV Volumes (MOD) LV vol d, MOD A2C: 144.0 ml LV vol d, MOD  A4C: 166.0 ml LV vol s, MOD A2C: 121.0 ml LV vol s, MOD A4C: 144.0 ml LV SV MOD A2C:     23.0 ml LV SV MOD A4C:     166.0 ml LV SV MOD BP:      25.0 ml RIGHT VENTRICLE RV Basal diam:  3.00 cm TAPSE (M-mode): 1.4 cm LEFT ATRIUM             Index       RIGHT ATRIUM            Index LA diam:        4.70 cm 2.06 cm/m  RA Area:     19.80 cm LA Vol (A2C):   49.4 ml 21.61 ml/m RA Volume:   64.80 ml  28.34 ml/m LA Vol (A4C):   59.9 ml 26.20 ml/m LA Biplane Vol: 57.7 ml 25.24 ml/m  AORTIC VALVE                   PULMONIC VALVE AV Area (Vmax):    2.02 cm    PV Vmax:       0.60 m/s AV Area (Vmean):   2.06 cm    PV Vmean:      42.700 cm/s AV Area (VTI):     2.06 cm    PV VTI:        0.094 m AV Vmax:           82.70 cm/s  PV Peak grad:  1.5 mmHg AV Vmean:          55.000 cm/s PV Mean grad:  1.0 mmHg AV VTI:            0.107 m AV Peak Grad:      2.7 mmHg AV Mean Grad:      1.0 mmHg LVOT Vmax:         53.30 cm/s LVOT Vmean:        36.000 cm/s LVOT VTI:          0.070 m LVOT/AV VTI ratio: 0.66  AORTA Ao Root diam: 3.10 cm MITRAL VALVE               TRICUSPID VALVE MV Area (PHT): 5.06 cm    TR Peak grad:   19.4 mmHg MV Area VTI:   2.33 cm    TR Vmax:        220.00 cm/s MV Peak grad:  4.0 mmHg MV Mean grad:  2.0 mmHg    SHUNTS MV Vmax:       1.00 m/s    Systemic VTI:  0.07 m MV Vmean:      62.9 cm/s   Systemic Diam: 2.00 cm MV Decel Time: 150 msec MV E velocity: 93.00 cm/s Dwayne Salome Arnt MD Electronically signed by Alwyn Pea MD Signature Date/Time: 07/05/2021/3:26:01 PM    Final      Assessment and Plan:   # Acute pulmonary embolism:  # RV failure:  Patient was admitted with symptoms attributable to acute pulmonary embolism.  There is evidence of RV dysfunction on echo.  Right atrial pressure was 15 mmHg and CT showed ascites as well as subcutaneous edema.  She definitely needs diuresis.  However she is hypotensive and not ready for this yet.  She has already been appropriately started on IV heparin.  We will start milrinone and once blood pressure is improved we will be able to start diuresis.  # Acute systolic and diastolic heart failure:  # Cardiogenic  shock: Patient presented with new onset LVEF less than 20%.  Prior echo 03/2020 revealed normal systolic  function.  She denied heart failure symptoms prior to this admission.  However, her LV was dilated, suggesting that this may have been more chronic.  She does report a history of MI, though I do not see any documentation of this.  [facility] and it does not appear she has ever seen a cardiologist.  On my review of her chest CT, I do not see any stents and she does not even have any significant coronary calcification.  Recommend an ischemic evaluation with right and left heart catheterization once she is more euvolemic and stable.  For now, she is quite ill and requires hemodynamic support.  We will get a PICC line and start her on IV milrinone.  We will check coox now and then daily.  We will also get regular CVP monitoring to better guide her diuresis.  Once more stable she will be started on guideline directed medical therapy and will need a repeat evaluation for ICD in 3 months.  She does report that her mother had heart failure but no other family members.  She does not know much about her mother's details.  She is also being treated for pneumonia and UTI.  Given her elevated leukocytosis, understand concern for septic shock.  Suspect this is likely mixed septic and cardiogenic shock.  We will know more once we get back the CVP and clocks.  She denies EtOH and no recent viral illness as the cause of her cardiomyopathy.       Risk Assessment/Risk Scores:        New York Heart Association (NYHA) Functional Class NYHA Class IV        For questions or updates, please contact [facility] Please consult [email or web address] for contact info under    Signed, Chilton Siiffany [place], MD  07/06/2021 11:44 AM

## 2021-07-06 NOTE — Consult Note (Addendum)
ANTICOAGULATION CONSULT NOTE  Pharmacy Consult for Heparin infusion Indication: pulmonary embolus  Patient Measurements: Heparin Dosing Weight: 77 kg   Labs: Recent Labs    07/04/21 1942 07/04/21 1942 07/04/21 2138 07/05/21 0546 07/05/21 1305 07/05/21 2109 07/06/21 0725  HGB 12.3  --   --  11.6*  --   --  12.0  HCT 39.0  --   --  36.5  --   --  36.9  PLT 372  --   --  216  --   --  320  APTT 28  --   --   --   --   --   --   LABPROT 14.0  --   --   --   --   --   --   INR 1.1  --   --   --   --   --   --   HEPARINUNFRC  --    < >  --  0.21* 0.22* 0.22* <0.10*  CREATININE 0.87  --   --  0.64  --   --  0.68  TROPONINIHS 101*  --  92*  --   --   --   --    < > = values in this interval not displayed.    Estimated Creatinine Clearance: 100.4 mL/min (by C-G formula based on SCr of 0.68 mg/dL).   Medical History: Past Medical History:  Diagnosis Date   Asthma    CAD (coronary artery disease)    Diabetes (HCC)    Hypertension    Medications:  No prior anticoagulation noted  Assessment: Martha Ramos with Hx of recent MI, presents with chest pain. CT positive for RLL PE without evidence of right heart strain. Pharmacy has been consulted for initiation and management of heparin infusion for PE.  Goal of Therapy:  Heparin level 0.3-0.7 units/ml Monitor platelets by anticoagulation protocol: Yes  7/15 0546 HL 0.21, subtherapeutic - 1350 units/hr 7/15 1305 HL 0.22, subtherapeutic - 1500 units/hr 7/15 2109 HL 0.22, subtherapeutic - 1650 units/hr 7/16 0725 HL <0.10, subtherapeutic -1800 units/hr   Plan:  --Heparin level is subtherapeutic --Will bolus heparin 2000 units x 1 and increase heparin infusion rate to 2100 units/hr --Re-check HL 6 hours after rate change --Daily CBC per protocol while on IV heparin  [name]  07/06/2021 11:48 AM

## 2021-07-06 NOTE — Progress Notes (Signed)
Heparin on hold for two  hours to enable placement of central line, so we can start patient on Milrinone.

## 2021-07-06 NOTE — Progress Notes (Signed)
PROGRESS NOTE    Martha Ramos  ZOX:[id number] DOB: 1971-11-19 DOA: 07/04/2021 PCP: Darnelle Spangle, MD   Chief complaint.  Chest pain. Brief Narrative:  Martha Ramos is a 50 y.o. African American female with medical history significant for asthma, type 2 diabetes mellitus, hypertension and coronary artery disease, who presented to the emergency room with acute onset of midsternal chest pain with associated dyspnea as well as cough occasionally productive of clear sputum and occasional wheezing. Upon arriving in the hospital, patient had a severe tachycardia, with heart rate in the 120s, leukocytosis of 20.7, lactic acidosis of 3.2.  CT angiogram of the chest showed a right lower lobe pulmonary emboli.  Mild cardiomegaly with evidence of right heart dysfunction.  Small bilateral pleural effusion with basilar atelectasis.  BNP elevated at 1128.3.  Troponin 101.  Patient was placed on IV Lasix.  She is also treated with Rocephin and Zithromax.   Assessment & Plan:   Active Problems:   Acute pulmonary embolism (HCC)   Acute on chronic diastolic CHF (congestive heart failure) (HCC)   Uncontrolled type 2 diabetes mellitus with hyperglycemia (HCC)  #1.  Acute right lower lobe pulmonary emboli. Elevated troponin secondary to PE. Patient does not have DVT.  Patient only has right lower lobe pulmonary emboli.  I will continue heparin drip for now.  Planning to change to oral Eliquis when condition is more stable.  2.  Acute on chronic systolic congestive heart failure. Right-sided congestive heart failure. Sinus tachycardia. Patient appears to have acute by chamber heart failure, most likely due to dilated cardiomyopathy. Tachycardia appears to be due to poor heart function as well as pulmonary emboli. Patient has elevated BNP, but clinically I do not see significant volume overload. I have requested cardiology to see the patient to help determine the volume status.  I personally  spoke with Dr. Duke Salvia about this patient.  #3.  Uncontrolled type 2 diabetes with hyperglycemia. Continue Lantus and sliding scale insulin.  4.  Urinary infection ruled out Sepsis ruled out. Patient urine culture came back negative, no infection source, no evidence of pneumonia.  I will discontinue antibiotics.  Blood cultures so far negative.  #5.  Lactic acidosis. Most likely due to metformin.  DVT prophylaxis: Heparin Code Status: full Family Communication:  Disposition Plan:    Status is: Inpatient  Remains inpatient appropriate because:IV treatments appropriate due to intensity of illness or inability to take PO and Inpatient level of care appropriate due to severity of illness  Dispo: The patient is from: Home              Anticipated d/c is to: Home              Patient currently is not medically stable to d/c.   Difficult to place patient No        I/O last 3 completed shifts: In: 1838.9 [P.O.:600; I.V.:489.6; IV Piggyback:749.3] Out: 700 [Urine:700] Total I/O In: 508.8 [P.O.:360; I.V.:148.8] Out: 350 [Urine:350]     Consultants:  Cardiology  Procedures: None  Antimicrobials: None   Subjective: Patient feels better today, she has no confusion. Denies any short of breath or cough. No fever or chills. No dysuria hematuria  No abdominal pain or nausea vomiting.  Objective: Vitals:   07/06/21 0200 07/06/21 0300 07/06/21 0800 07/06/21 1000  BP: 110/82 95/80 101/78 (!) 84/65  Pulse: (!) 135 (!) 110 (!) 105 64  Resp: (!) 28 (!) 30 20 (!) 27  Temp: 98.8 F (37.1 C)  98.7 F (37.1 C)   TempSrc: Oral  Oral   SpO2: 100% 100% 95% 100%  Weight:      Height:        Intake/Output Summary (Last 24 hours) at 07/06/2021 1104 Last data filed at 07/06/2021 0945 Gross per 24 hour  Intake 1235.81 ml  Output 800 ml  Net 435.81 ml   Filed Weights   07/04/21 1912 07/05/21 0200 07/06/21 0139  Weight: 97.1 kg 103.9 kg 106.9 kg    Examination:  General  exam: Appears calm and comfortable  Respiratory system: Clear to auscultation. Respiratory effort normal. Cardiovascular system: S1 & S2 heard, RRR. No JVD, murmurs, rubs, gallops or clicks. No pedal edema. Gastrointestinal system: Abdomen is nondistended, soft and nontender. No organomegaly or masses felt. Normal bowel sounds heard. Central nervous system: Alert and oriented. No focal neurological deficits. Extremities: Symmetric 5 x 5 power. Skin: No rashes, lesions or ulcers Psychiatry: Judgement and insight appear normal. Mood & affect appropriate.     Data Reviewed: I have personally reviewed following labs and imaging studies  CBC: Recent Labs  Lab 07/04/21 1942 07/05/21 0546 07/06/21 0725  WBC 20.7* 14.6* 15.3*  NEUTROABS 17.6*  --  11.8*  HGB 12.3 11.6* 12.0  HCT 39.0 36.5 36.9  MCV 92.2 91.0 91.1  PLT 372 216 320   Basic Metabolic Panel: Recent Labs  Lab 07/04/21 1942 07/05/21 0546 07/06/21 0725  NA 136 137 136  K 3.9 4.1 4.4  CL 103 106 103  CO2 22 24 23   GLUCOSE 339* 266* 213*  BUN 13 12 15   CREATININE 0.87 0.64 0.68  CALCIUM 8.9 8.6* 8.9  MG 1.4* 1.8 1.8   GFR: Estimated Creatinine Clearance: 100.4 mL/min (by C-G formula based on SCr of 0.68 mg/dL). Liver Function Tests: Recent Labs  Lab 07/04/21 1942 07/05/21 0546  AST 39 20  ALT 49* 40  ALKPHOS 65 58  BILITOT 0.8 0.8  PROT 6.6 6.3*  ALBUMIN 3.3* 3.0*   Recent Labs  Lab 07/04/21 1942  LIPASE 34   No results for input(s): AMMONIA in the last 168 hours. Coagulation Profile: Recent Labs  Lab 07/04/21 1942  INR 1.1   Cardiac Enzymes: No results for input(s): CKTOTAL, CKMB, CKMBINDEX, TROPONINI in the last 168 hours. BNP (last 3 results) No results for input(s): PROBNP in the last 8760 hours. HbA1C: Recent Labs    07/05/21 0937  HGBA1C 6.2*   CBG: Recent Labs  Lab 07/05/21 0924 07/05/21 1154 07/05/21 1630 07/05/21 2130 07/06/21 0720  GLUCAP 300* 207* 102* 130* 199*    Lipid Profile: No results for input(s): CHOL, HDL, LDLCALC, TRIG, CHOLHDL, LDLDIRECT in the last 72 hours. Thyroid Function Tests: Recent Labs    07/05/21 0546  TSH 1.204   Anemia Panel: No results for input(s): VITAMINB12, FOLATE, FERRITIN, TIBC, IRON, RETICCTPCT in the last 72 hours. Sepsis Labs: Recent Labs  Lab 07/04/21 1942 07/05/21 0037 07/05/21 0546 07/06/21 0837  PROCALCITON  --   --  0.23  --   LATICACIDVEN 3.2* 2.6*  --  1.9    Recent Results (from the past 240 hour(s))  Resp Panel by RT-PCR (Flu A&B, Covid) Nasopharyngeal Swab     Status: None   Collection Time: 07/04/21  9:38 PM   Specimen: Nasopharyngeal Swab; Nasopharyngeal(NP) swabs in vial transport medium  Result Value Ref Range Status   SARS Coronavirus 2 by RT PCR NEGATIVE NEGATIVE Final    Comment: (NOTE) SARS-CoV-2 target nucleic acids are NOT DETECTED.  The SARS-CoV-2 RNA is generally detectable in upper respiratory specimens during the acute phase of infection. The lowest concentration of SARS-CoV-2 viral copies this assay can detect is 138 copies/mL. A negative result does not preclude SARS-Cov-2 infection and should not be used as the sole basis for treatment or other patient management decisions. A negative result may occur with  improper specimen collection/handling, submission of specimen other than nasopharyngeal swab, presence of viral mutation(s) within the areas targeted by this assay, and inadequate number of viral copies(<138 copies/mL). A negative result must be combined with clinical observations, patient history, and epidemiological information. The expected result is Negative.  Fact Sheet for Patients:  BloggerCourse.com  Fact Sheet for Healthcare Providers:  SeriousBroker.it  This test is no t yet approved or cleared by the [place] FDA and  has been authorized for detection and/or diagnosis of SARS-CoV-2 by FDA under an  Emergency Use Authorization (EUA). This EUA will remain  in effect (meaning this test can be used) for the duration of the COVID-19 declaration under Section 564(b)(1) of the Act, 21 U.S.C.section 360bbb-3(b)(1), unless the authorization is terminated  or revoked sooner.       Influenza A by PCR NEGATIVE NEGATIVE Final   Influenza B by PCR NEGATIVE NEGATIVE Final    Comment: (NOTE) The Xpert Xpress SARS-CoV-2/FLU/RSV plus assay is intended as an aid in the diagnosis of influenza from Nasopharyngeal swab specimens and should not be used as a sole basis for treatment. Nasal washings and aspirates are unacceptable for Xpert Xpress SARS-CoV-2/FLU/RSV testing.  Fact Sheet for Patients: BloggerCourse.com  Fact Sheet for Healthcare Providers: SeriousBroker.it  This test is not yet approved or cleared by the [place] FDA and has been authorized for detection and/or diagnosis of SARS-CoV-2 by FDA under an Emergency Use Authorization (EUA). This EUA will remain in effect (meaning this test can be used) for the duration of the COVID-19 declaration under Section 564(b)(1) of the Act, 21 U.S.C. section 360bbb-3(b)(1), unless the authorization is terminated or revoked.  Performed at [facility], [address] [place], [place] [address]   Culture, blood (routine x 2)     Status: None (Preliminary result)   Collection Time: 07/04/21 10:01 PM   Specimen: BLOOD  Result Value Ref Range Status   Specimen Description BLOOD BLOOD RIGHT HAND  Final   Special Requests   Final    BOTTLES DRAWN AEROBIC AND ANAEROBIC Blood Culture adequate volume   Culture   Final    NO GROWTH 2 DAYS Performed at [facility], [address], [place], [place] [address]    Report Status PENDING  Incomplete  Culture, blood (routine x 2)     Status: None (Preliminary result)   Collection Time: 07/04/21 10:01 PM   Specimen: BLOOD  Result  Value Ref Range Status   Specimen Description BLOOD RIGHT ANTECUBITAL  Final   Special Requests   Final    BOTTLES DRAWN AEROBIC AND ANAEROBIC Blood Culture adequate volume   Culture   Final    NO GROWTH 2 DAYS Performed at [facility], [address] [place], [place] [address]    Report Status PENDING  Incomplete  Urine Culture     Status: None   Collection Time: 07/04/21 11:30 PM   Specimen: Urine, Catheterized  Result Value Ref Range Status   Specimen Description   Final    URINE, CATHETERIZED Performed at [facility], [address], [place], [place] [address]  Special Requests   Final    NONE Performed at [facility], [address], [place], [place] [address]    Culture   Final    NO GROWTH Performed at [facility], [address], [place], [place] [address]    Report Status 07/06/2021 FINAL  Final  MRSA Next Gen by PCR, Nasal     Status: None   Collection Time: 07/05/21  2:15 AM   Specimen: Nasal Mucosa; Nasal Swab  Result Value Ref Range Status   MRSA by PCR Next Gen NOT DETECTED NOT DETECTED Final    Comment: (NOTE) The GeneXpert MRSA Assay (FDA approved for NASAL specimens only), is one component of a comprehensive MRSA colonization surveillance program. It is not intended to diagnose MRSA infection nor to guide or monitor treatment for MRSA infections. Test performance is not FDA approved in patients less than 67 years old. Performed at [facility], [address], [place], [place] [address]          Radiology Studies: CT Angio Chest PE W/Cm &/Or Wo Cm  Result Date: 07/04/2021 CLINICAL DATA:  50 year old female with abdominal pain and chest pain. EXAM: CT ANGIOGRAPHY CHEST CT ABDOMEN AND PELVIS WITH CONTRAST TECHNIQUE: Multidetector CT imaging of the chest was performed using the standard protocol during bolus administration of intravenous contrast. Multiplanar CT image reconstructions and MIPs were  obtained to evaluate the vascular anatomy. Multidetector CT imaging of the abdomen and pelvis was performed using the standard protocol during bolus administration of intravenous contrast. CONTRAST:  OMNIPAQUE IOHEXOL 350 MG/ML SOLN COMPARISON:  Chest radiograph dated 07/04/2021. FINDINGS: CTA CHEST FINDINGS Cardiovascular: There is mild cardiomegaly. There is retrograde flow of contrast from the right atrium into the IVC suggestive of right heart dysfunction. Correlation with echocardiogram recommended. No pericardial effusion. The thoracic aorta is unremarkable. Pulmonary artery embolus noted at the right lower lobe lobar branch point (145/5). No CT evidence of right heart straining. Mediastinum/Nodes: There is no hilar or mediastinal adenopathy. The esophagus and the thyroid gland are grossly unremarkable. No mediastinal fluid collection. Lungs/Pleura: Small bilateral pleural effusions with bibasilar atelectasis. Pneumonia is not excluded clinical correlation is recommended. No pneumothorax. The central airways are patent. Musculoskeletal: Diffuse subcutaneous edema. No fluid collection. No acute osseous pathology. Review of the MIP images confirms the above findings. CT ABDOMEN and PELVIS FINDINGS No intra-abdominal free air. Small ascites. Hepatobiliary: No focal liver abnormality is seen. No gallstones, gallbladder wall thickening, or biliary dilatation. Pancreas: Unremarkable. No pancreatic ductal dilatation or surrounding inflammatory changes. Spleen: Normal in size without focal abnormality. Adrenals/Urinary Tract: The adrenal glands are unremarkable. There is no hydronephrosis and side. There is symmetric enhancement and excretion of contrast by both kidneys. The visualized ureters and the urinary bladder appear unremarkable. Stomach/Bowel: There is no bowel obstruction or active inflammation. The appendix is normal. Vascular/Lymphatic: Mild aortoiliac atherosclerotic disease. The IVC is  unremarkable. No portal venous gas. There is no adenopathy. Reproductive: Hysterectomy. No adnexal masses. Other: Diffuse subcutaneous edema. Small fat containing umbilical hernia. Musculoskeletal: No acute or significant osseous findings. Review of the MIP images confirms the above findings. IMPRESSION: 1. Right lower lobe lobar pulmonary artery embolus. No CT evidence of right heart straining. 2. Mild cardiomegaly with evidence of right heart dysfunction. Correlation with echocardiogram recommended. 3. Small bilateral pleural effusions with bibasilar atelectasis. Pneumonia is not excluded. Clinical correlation is recommended. 4. Small ascites and diffuse subcutaneous edema and anasarca. 5. No bowel obstruction. Normal appendix. 6.  Aortic Atherosclerosis (ICD10-I70.0). These results were called by telephone at the time of interpretation on 07/04/2021 at 9:31 pm to provider [facility] , who verbally acknowledged these results. Electronically Signed   By: Elgie Collard M.D.   On: 07/04/2021 21:34   CT Abdomen Pelvis W Contrast  Result Date: 07/04/2021 CLINICAL DATA:  50 year old female with abdominal pain and chest pain. EXAM: CT ANGIOGRAPHY CHEST CT ABDOMEN AND PELVIS WITH CONTRAST TECHNIQUE: Multidetector CT imaging of the chest was performed using the standard protocol during bolus administration of intravenous contrast. Multiplanar CT image reconstructions and MIPs were obtained to evaluate the vascular anatomy. Multidetector CT imaging of the abdomen and pelvis was performed using the standard protocol during bolus administration of intravenous contrast. CONTRAST:  OMNIPAQUE IOHEXOL 350 MG/ML SOLN COMPARISON:  Chest radiograph dated 07/04/2021. FINDINGS: CTA CHEST FINDINGS Cardiovascular: There is mild cardiomegaly. There is retrograde flow of contrast from the right atrium into the IVC suggestive of right heart dysfunction. Correlation with echocardiogram recommended. No pericardial effusion.  The thoracic aorta is unremarkable. Pulmonary artery embolus noted at the right lower lobe lobar branch point (145/5). No CT evidence of right heart straining. Mediastinum/Nodes: There is no hilar or mediastinal adenopathy. The esophagus and the thyroid gland are grossly unremarkable. No mediastinal fluid collection. Lungs/Pleura: Small bilateral pleural effusions with bibasilar atelectasis. Pneumonia is not excluded clinical correlation is recommended. No pneumothorax. The central airways are patent. Musculoskeletal: Diffuse subcutaneous edema. No fluid collection. No acute osseous pathology. Review of the MIP images confirms the above findings. CT ABDOMEN and PELVIS FINDINGS No intra-abdominal free air. Small ascites. Hepatobiliary: No focal liver abnormality is seen. No gallstones, gallbladder wall thickening, or biliary dilatation. Pancreas: Unremarkable. No pancreatic ductal dilatation or surrounding inflammatory changes. Spleen: Normal in size without focal abnormality. Adrenals/Urinary Tract: The adrenal glands are unremarkable. There is no hydronephrosis and side. There is symmetric enhancement and excretion of contrast by both kidneys. The visualized ureters and the urinary bladder appear unremarkable. Stomach/Bowel: There is no bowel obstruction or active inflammation. The appendix is normal. Vascular/Lymphatic: Mild aortoiliac atherosclerotic disease. The IVC is unremarkable. No portal venous gas. There is no adenopathy. Reproductive: Hysterectomy. No adnexal masses. Other: Diffuse subcutaneous edema. Small fat containing umbilical hernia. Musculoskeletal: No acute or significant osseous findings. Review of the MIP images confirms the above findings. IMPRESSION: 1. Right lower lobe lobar pulmonary artery embolus. No CT evidence of right heart straining. 2. Mild cardiomegaly with evidence of right heart dysfunction. Correlation with echocardiogram recommended. 3. Small bilateral pleural effusions with  bibasilar atelectasis. Pneumonia is not excluded. Clinical correlation is recommended. 4. Small ascites and diffuse subcutaneous edema and anasarca. 5. No bowel obstruction. Normal appendix. 6. Aortic Atherosclerosis (ICD10-I70.0). These results were called by telephone at the time of interpretation on 07/04/2021 at 9:31 pm to provider [facility] , who verbally acknowledged these results. Electronically Signed   By: Elgie Collard M.D.   On: 07/04/2021 21:34   US Venous Img Lower Bilateral (DVT)  Result Date: 07/05/2021 CLINICAL DATA:  50 year old female with PE. Evaluate for residual DVT. EXAM: BILATERAL LOWER EXTREMITY VENOUS DOPPLER ULTRASOUND TECHNIQUE: Gray-scale sonography with graded compression, as well as color Doppler and duplex ultrasound were performed to evaluate the lower extremity deep venous systems from the level of the common femoral vein and including the common femoral, femoral, profunda femoral, popliteal and calf veins including the posterior tibial, peroneal and gastrocnemius veins when visible. The superficial great saphenous vein was also interrogated. Spectral Doppler  was utilized to evaluate flow at rest and with distal augmentation maneuvers in the common femoral, femoral and popliteal veins. COMPARISON:  None. FINDINGS: RIGHT LOWER EXTREMITY Common Femoral Vein: No evidence of thrombus. Normal compressibility, respiratory phasicity and response to augmentation. Saphenofemoral Junction: No evidence of thrombus. Normal compressibility and flow on color Doppler imaging. Profunda Femoral Vein: No evidence of thrombus. Normal compressibility and flow on color Doppler imaging. Femoral Vein: No evidence of thrombus. Normal compressibility, respiratory phasicity and response to augmentation. Popliteal Vein: No evidence of thrombus. Normal compressibility, respiratory phasicity and response to augmentation. Calf Veins: No evidence of thrombus. Normal compressibility and flow on color  Doppler imaging. Superficial Great Saphenous Vein: No evidence of thrombus. Normal compressibility. Venous Reflux:  None. Other Findings:  None. LEFT LOWER EXTREMITY Common Femoral Vein: No evidence of thrombus. Normal compressibility, respiratory phasicity and response to augmentation. Saphenofemoral Junction: No evidence of thrombus. Normal compressibility and flow on color Doppler imaging. Profunda Femoral Vein: No evidence of thrombus. Normal compressibility and flow on color Doppler imaging. Femoral Vein: No evidence of thrombus. Normal compressibility, respiratory phasicity and response to augmentation. Popliteal Vein: No evidence of thrombus. Normal compressibility, respiratory phasicity and response to augmentation. Calf Veins: No evidence of thrombus. Normal compressibility and flow on color Doppler imaging. Superficial Great Saphenous Vein: No evidence of thrombus. Normal compressibility. Venous Reflux:  None. Other Findings:  None. IMPRESSION: No evidence of deep venous thrombosis in either lower extremity. Electronically Signed   By: Malachy Moan M.D.   On: 07/05/2021 11:04   US Venous Img Upper Uni Right(DVT)  Result Date: 07/05/2021 CLINICAL DATA:  Pulmonary embolus, right upper extremity pain and edema EXAM: RIGHT UPPER EXTREMITY VENOUS DOPPLER ULTRASOUND TECHNIQUE: Gray-scale sonography with graded compression, as well as color Doppler and duplex ultrasound were performed to evaluate the upper extremity deep venous system from the level of the subclavian vein and including the jugular, axillary, basilic, radial, ulnar and upper cephalic vein. Spectral Doppler was utilized to evaluate flow at rest and with distal augmentation maneuvers. COMPARISON:  None. FINDINGS: Contralateral Subclavian Vein: Respiratory phasicity is normal and symmetric with the symptomatic side. No evidence of thrombus. Normal compressibility. Internal Jugular Vein: No evidence of thrombus. Normal compressibility,  respiratory phasicity and response to augmentation. Subclavian Vein: No evidence of thrombus. Normal compressibility, respiratory phasicity and response to augmentation. Axillary Vein: No evidence of thrombus. Normal compressibility, respiratory phasicity and response to augmentation. Cephalic Vein: No evidence of thrombus. Normal compressibility, respiratory phasicity and response to augmentation. Basilic Vein: No evidence of thrombus. Normal compressibility, respiratory phasicity and response to augmentation. Brachial Veins: No evidence of thrombus. Normal compressibility, respiratory phasicity and response to augmentation. Radial Veins: No evidence of thrombus. Normal compressibility, respiratory phasicity and response to augmentation. Ulnar Veins: No evidence of thrombus. Normal compressibility, respiratory phasicity and response to augmentation. IMPRESSION: No evidence of DVT within the right upper extremity. Electronically Signed   By: Judie Petit.  Shick M.D.   On: 07/05/2021 12:33   DG Chest Portable 1 View  Result Date: 07/04/2021 CLINICAL DATA:  Chest pain EXAM: PORTABLE CHEST 1 VIEW COMPARISON:  None. FINDINGS: Cardiac shadow is enlarged but somewhat accentuated by the portable technique. Small bilateral pleural effusions are noted right greater than left. Likely right basilar atelectasis is present as well. No bony abnormality is seen. IMPRESSION: Small effusions bilaterally with likely right basilar atelectasis. Electronically Signed   By: Alcide Clever M.D.   On: 07/04/2021 20:29   ECHOCARDIOGRAM COMPLETE  Result  Date: 07/05/2021    ECHOCARDIOGRAM REPORT   Patient Name:   BRAYLIE BADAMI Date of Exam: 07/05/2021 Medical Rec #:  [id number]         Height:       64.0 in Accession #:    [id number]        Weight:       288.6 lb Date of Birth:  07-Jun-1971         BSA:          2.286 m Patient Age:    49 years          BP:           102/77 mmHg Patient Gender: F                 HR:           108 bpm. Exam  Location:  [facility] Procedure: 2D Echo, Color Doppler, Cardiac Doppler and Intracardiac            Opacification Agent Indications:     I26.09 Pulmonary Embolus  History:         Patient has no prior history of Echocardiogram examinations.                  CAD; Risk Factors:Hypertension and Diabetes. Asthma.  Sonographer:     Humphrey Rolls RDCS (AE) Referring Phys:  [id number] Vernetta Honey MANSY Diagnosing Phys: Alwyn Pea MD IMPRESSIONS  1. Severely dilated systolic cardiomyopathy.  2. Left ventricular ejection fraction, by estimation, is <20%. The left ventricle has severely decreased function. The left ventricle has no regional wall motion abnormalities. The left ventricular internal cavity size was severely dilated. Left ventricular diastolic parameters were normal.  3. Right ventricular systolic function is severely reduced. The right ventricular size is moderately enlarged.  4. Left atrial size was moderately dilated.  5. Right atrial size was moderately dilated.  6. The mitral valve is grossly normal. Moderate mitral valve regurgitation.  7. Tricuspid valve regurgitation is moderate.  8. The aortic valve is grossly normal. Aortic valve regurgitation is not visualized. FINDINGS  Left Ventricle: Left ventricular ejection fraction, by estimation, is <20%. The left ventricle has severely decreased function. The left ventricle has no regional wall motion abnormalities. Definity contrast agent was given IV to delineate the left ventricular endocardial borders. The left ventricular internal cavity size was severely dilated. There is no left ventricular hypertrophy. Left ventricular diastolic parameters were normal. Right Ventricle: The right ventricular size is moderately enlarged. No increase in right ventricular wall thickness. Right ventricular systolic function is severely reduced. Left Atrium: Left atrial size was moderately dilated. Right Atrium: Right atrial size was moderately dilated. Pericardium: There is no  evidence of pericardial effusion. Mitral Valve: The mitral valve is grossly normal. There is mild thickening of the mitral valve leaflet(s). Normal mobility of the mitral valve leaflets. Mild mitral annular calcification. Moderate mitral valve regurgitation. MV peak gradient, 4.0 mmHg. The mean mitral valve gradient is 2.0 mmHg. Tricuspid Valve: The tricuspid valve is normal in structure. Tricuspid valve regurgitation is moderate. Aortic Valve: The aortic valve is grossly normal. Aortic valve regurgitation is not visualized. Aortic valve mean gradient measures 1.0 mmHg. Aortic valve peak gradient measures 2.7 mmHg. Aortic valve area, by VTI measures 2.06 cm. Pulmonic Valve: The pulmonic valve was normal in structure. Pulmonic valve regurgitation is mild to moderate. Aorta: The ascending aorta was not well visualized. IAS/Shunts: No atrial level shunt detected by color flow Doppler.  Additional Comments: Severely dilated systolic cardiomyopathy.  LEFT VENTRICLE PLAX 2D LVIDd:         6.10 cm      Diastology LVIDs:         5.50 cm      LV e' medial:    6.20 cm/s LV PW:         1.20 cm      LV E/e' medial:  15.0 LV IVS:        0.90 cm      LV e' lateral:   6.64 cm/s LVOT diam:     2.00 cm      LV E/e' lateral: 14.0 LV SV:         22 LV SV Index:   10 LVOT Area:     3.14 cm  LV Volumes (MOD) LV vol d, MOD A2C: 144.0 ml LV vol d, MOD A4C: 166.0 ml LV vol s, MOD A2C: 121.0 ml LV vol s, MOD A4C: 144.0 ml LV SV MOD A2C:     23.0 ml LV SV MOD A4C:     166.0 ml LV SV MOD BP:      25.0 ml RIGHT VENTRICLE RV Basal diam:  3.00 cm TAPSE (M-mode): 1.4 cm LEFT ATRIUM             Index       RIGHT ATRIUM           Index LA diam:        4.70 cm 2.06 cm/m  RA Area:     19.80 cm LA Vol (A2C):   49.4 ml 21.61 ml/m RA Volume:   64.80 ml  28.34 ml/m LA Vol (A4C):   59.9 ml 26.20 ml/m LA Biplane Vol: 57.7 ml 25.24 ml/m  AORTIC VALVE                   PULMONIC VALVE AV Area (Vmax):    2.02 cm    PV Vmax:       0.60 m/s AV Area  (Vmean):   2.06 cm    PV Vmean:      42.700 cm/s AV Area (VTI):     2.06 cm    PV VTI:        0.094 m AV Vmax:           82.70 cm/s  PV Peak grad:  1.5 mmHg AV Vmean:          55.000 cm/s PV Mean grad:  1.0 mmHg AV VTI:            0.107 m AV Peak Grad:      2.7 mmHg AV Mean Grad:      1.0 mmHg LVOT Vmax:         53.30 cm/s LVOT Vmean:        36.000 cm/s LVOT VTI:          0.070 m LVOT/AV VTI ratio: 0.66  AORTA Ao Root diam: 3.10 cm MITRAL VALVE               TRICUSPID VALVE MV Area (PHT): 5.06 cm    TR Peak grad:   19.4 mmHg MV Area VTI:   2.33 cm    TR Vmax:        220.00 cm/s MV Peak grad:  4.0 mmHg MV Mean grad:  2.0 mmHg    SHUNTS MV Vmax:       1.00 m/s    Systemic VTI:  0.07  m MV Vmean:      62.9 cm/s   Systemic Diam: 2.00 cm MV Decel Time: 150 msec MV E velocity: 93.00 cm/s Alwyn Pea MD Electronically signed by Alwyn Pea MD Signature Date/Time: 07/05/2021/3:26:01 PM    Final         Scheduled Meds:  atorvastatin  20 mg Oral Daily   Chlorhexidine Gluconate Cloth  6 each Topical Q0600   DULoxetine  60 mg Oral Daily   glipiZIDE  5 mg Oral BID AC   guaiFENesin  600 mg Oral BID   haloperidol  10 mg Oral QHS   insulin aspart  0-9 Units Subcutaneous TID WC   insulin glargine  12 Units Subcutaneous QHS   mouth rinse  15 mL Mouth Rinse BID   pantoprazole  40 mg Oral Daily   potassium chloride SA  20 mEq Oral Daily   potassium phosphate (monobasic)  500 mg Oral BID   Continuous Infusions:  cefTRIAXone (ROCEPHIN)  IV Stopped (07/05/21 2210)   heparin 1,800 Units/hr (07/06/21 0945)     LOS: 2 days    Time spent: 36 minutes    Marrion Coy, MD [facility]   To contact the attending provider between 7A-7P or the covering provider during after hours 7P-7A, please log into the web site [email or web address] and access using universal [place] password for that web site. If you do not have the password, please call the hospital operator.  07/06/2021, 11:04 AM

## 2021-07-06 NOTE — Procedures (Signed)
Central Venous Catheter Placement:TRIPLE LUMEN   Procedure: Insertion of Non-tunneled Central Venous Catheter(36556) with US guidance (17001)   Indication(s) Medication administration and Difficult access  CVP monitoring Patient receiving vesicant or irritant drug.; Patient receiving intravenous therapy for longer than 5 days.; Patient has limited or no vascular access.    Consent Risks of the procedure as well as the alternatives and risks of each were explained to the patient and/or caregiver.  Consent for the procedure was obtained and is signed in the bedside chart  Consent:verbal/written and signed by patient   Anesthesia Topical only with 1% lidocaine    Timeout Verified patient identification, verified procedure, site/side was marked, verified correct patient position, special equipment/implants available, medications/allergies/relevant history reviewed, required imaging and test results available. Patient comfort was obtained.     Sterile Technique Maximal sterile technique including full sterile barrier drape, hand hygiene, sterile gown, sterile gloves, mask, hair covering, sterile ultrasound probe cover (if used).   Hand washing performed prior to starting the procedure.    Procedure Description Area of catheter insertion was cleaned with chlorhexidine and draped in sterile fashion.  With real-time ultrasound guidance a central venous catheter was placed into the right internal jugular vein. Nonpulsatile blood flow and easy flushing noted in all ports.  The catheter was sutured in place and sterile dressing applied.   A triple lumen catheter was placed in RT  Internal Jugular Vein There was good blood return, catheter caps were placed on lumens, catheter flushed easily, the line was secured and a sterile dressing and BIO-PATCH applied.    Complications/Tolerance None; patient tolerated the procedure well. Chest X-ray is ordered to verify placement     EBL Minimal    Specimen(s) None   Number of Attempts: 1 Complications:none Estimated Blood Loss: none Chest Radiograph indicated and ordered.  Operator: Catalena Stanhope.   Lucie Leather, M.D.  [facility]  Medical Director [facility] Medical Director [facility] Cardio-Pulmonary Department

## 2021-07-07 DIAGNOSIS — I5041 Acute combined systolic (congestive) and diastolic (congestive) heart failure: Secondary | ICD-10-CM | POA: Diagnosis not present

## 2021-07-07 DIAGNOSIS — I2609 Other pulmonary embolism with acute cor pulmonale: Secondary | ICD-10-CM | POA: Diagnosis not present

## 2021-07-07 DIAGNOSIS — I50810 Right heart failure, unspecified: Secondary | ICD-10-CM

## 2021-07-07 DIAGNOSIS — R570 Cardiogenic shock: Secondary | ICD-10-CM | POA: Diagnosis not present

## 2021-07-07 DIAGNOSIS — E1165 Type 2 diabetes mellitus with hyperglycemia: Secondary | ICD-10-CM | POA: Diagnosis not present

## 2021-07-07 LAB — CBC WITH DIFFERENTIAL/PLATELET
Abs Immature Granulocytes: 0.07 10*3/uL (ref 0.00–0.07)
Basophils Absolute: 0.1 10*3/uL (ref 0.0–0.1)
Basophils Relative: 0 %
Eosinophils Absolute: 0 10*3/uL (ref 0.0–0.5)
Eosinophils Relative: 0 %
HCT: 34.5 % — ABNORMAL LOW (ref 36.0–46.0)
Hemoglobin: 11.1 g/dL — ABNORMAL LOW (ref 12.0–15.0)
Immature Granulocytes: 1 %
Lymphocytes Relative: 19 %
Lymphs Abs: 2.6 10*3/uL (ref 0.7–4.0)
MCH: 29.8 pg (ref 26.0–34.0)
MCHC: 32.2 g/dL (ref 30.0–36.0)
MCV: 92.7 fL (ref 80.0–100.0)
Monocytes Absolute: 1.2 10*3/uL — ABNORMAL HIGH (ref 0.1–1.0)
Monocytes Relative: 9 %
Neutro Abs: 9.8 10*3/uL — ABNORMAL HIGH (ref 1.7–7.7)
Neutrophils Relative %: 71 %
Platelets: 338 10*3/uL (ref 150–400)
RBC: 3.72 MIL/uL — ABNORMAL LOW (ref 3.87–5.11)
RDW: 14.6 % (ref 11.5–15.5)
WBC: 13.7 10*3/uL — ABNORMAL HIGH (ref 4.0–10.5)
nRBC: 0 % (ref 0.0–0.2)

## 2021-07-07 LAB — BASIC METABOLIC PANEL
Anion gap: 10 (ref 5–15)
Anion gap: 5 (ref 5–15)
BUN: 14 mg/dL (ref 6–20)
BUN: 14 mg/dL (ref 6–20)
CO2: 24 mmol/L (ref 22–32)
CO2: 28 mmol/L (ref 22–32)
Calcium: 8.8 mg/dL — ABNORMAL LOW (ref 8.9–10.3)
Calcium: 8.8 mg/dL — ABNORMAL LOW (ref 8.9–10.3)
Chloride: 101 mmol/L (ref 98–111)
Chloride: 104 mmol/L (ref 98–111)
Creatinine, Ser: 0.69 mg/dL (ref 0.44–1.00)
Creatinine, Ser: 0.76 mg/dL (ref 0.44–1.00)
GFR, Estimated: >60 mL/min (ref >60)
GFR, Estimated: >60 mL/min (ref >60)
Glucose, Bld: 249 mg/dL — ABNORMAL HIGH (ref 70–99)
Glucose, Bld: 111 mg/dL — ABNORMAL HIGH (ref 70–99)
Potassium: 4.5 mmol/L (ref 3.5–5.1)
Potassium: 3.8 mmol/L (ref 3.5–5.1)
Sodium: 135 mmol/L (ref 135–145)
Sodium: 137 mmol/L (ref 135–145)

## 2021-07-07 LAB — ANTITHROMBIN III: AntiThromb III Func: 99 % (ref 75–120)

## 2021-07-07 LAB — HEPARIN LEVEL (UNFRACTIONATED)
Heparin Unfractionated: 0.97 IU/mL — ABNORMAL HIGH (ref 0.30–0.70)
Heparin Unfractionated: 0.9 IU/mL — ABNORMAL HIGH (ref 0.30–0.70)

## 2021-07-07 LAB — GLUCOSE, CAPILLARY
Glucose-Capillary: 225 mg/dL — ABNORMAL HIGH (ref 70–99)
Glucose-Capillary: 228 mg/dL — ABNORMAL HIGH (ref 70–99)
Glucose-Capillary: 85 mg/dL (ref 70–99)
Glucose-Capillary: 213 mg/dL — ABNORMAL HIGH (ref 70–99)

## 2021-07-07 LAB — MAGNESIUM
Magnesium: 1.9 mg/dL (ref 1.7–2.4)
Magnesium: 1.7 mg/dL (ref 1.7–2.4)

## 2021-07-07 LAB — COOXEMETRY PANEL
Carboxyhemoglobin: 0.2 % — ABNORMAL LOW (ref 0.5–1.5)
Methemoglobin: 0.7 % (ref 0.0–1.5)
O2 Saturation: 41.6 %

## 2021-07-07 MED ORDER — INSULIN ASPART 100 UNIT/ML IJ SOLN
0.0000 [IU] | Freq: Three times a day (TID) | INTRAMUSCULAR | Status: DC
  Administered 2021-07-07 (×3): 5 [IU] via SUBCUTANEOUS
  Administered 2021-07-08 (×3): 3 [IU] via SUBCUTANEOUS
  Administered 2021-07-09: 8 [IU] via SUBCUTANEOUS
  Administered 2021-07-09: 2 [IU] via SUBCUTANEOUS
  Administered 2021-07-10: 5 [IU] via SUBCUTANEOUS
  Administered 2021-07-10: 3 [IU] via SUBCUTANEOUS
  Administered 2021-07-10: 8 [IU] via SUBCUTANEOUS
  Administered 2021-07-10: 3 [IU] via SUBCUTANEOUS
  Administered 2021-07-11: 8 [IU] via SUBCUTANEOUS
  Administered 2021-07-11 (×2): 3 [IU] via SUBCUTANEOUS
  Administered 2021-07-12 (×2): 2 [IU] via SUBCUTANEOUS
  Filled 2021-07-07 (×18): qty 1

## 2021-07-07 MED ORDER — FUROSEMIDE 10 MG/ML IJ SOLN
80.0000 mg | Freq: Two times a day (BID) | INTRAMUSCULAR | Status: DC
  Administered 2021-07-08 – 2021-07-09 (×3): 80 mg via INTRAVENOUS
  Filled 2021-07-07 (×3): qty 8

## 2021-07-07 MED ORDER — PHENYLEPHRINE HCL-NACL 10-0.9 MG/250ML-% IV SOLN
0.0000 ug/min | INTRAVENOUS | Status: DC
Start: 2021-07-07 — End: 2021-07-08
  Filled 2021-07-07: qty 250

## 2021-07-07 MED ORDER — INSULIN GLARGINE 100 UNIT/ML ~~LOC~~ SOLN
20.0000 [IU] | Freq: Every day | SUBCUTANEOUS | Status: DC
  Administered 2021-07-07 – 2021-07-08 (×2): 20 [IU] via SUBCUTANEOUS
  Filled 2021-07-07 (×3): qty 0.2

## 2021-07-07 MED ORDER — MAGNESIUM OXIDE -MG SUPPLEMENT 400 (240 MG) MG PO TABS
400.0000 mg | ORAL_TABLET | Freq: Once | ORAL | Status: AC
  Administered 2021-07-07: 400 mg via ORAL
  Filled 2021-07-07: qty 1

## 2021-07-07 MED ORDER — DIGOXIN 0.25 MG/ML IJ SOLN
0.1250 mg | Freq: Four times a day (QID) | INTRAMUSCULAR | Status: AC
  Administered 2021-07-07 (×2): 0.125 mg via INTRAVENOUS
  Filled 2021-07-07 (×2): qty 2

## 2021-07-07 MED ORDER — METOPROLOL TARTRATE 5 MG/5ML IV SOLN
2.5000 mg | Freq: Once | INTRAVENOUS | Status: AC
  Administered 2021-07-07: 2.5 mg via INTRAVENOUS

## 2021-07-07 MED ORDER — POTASSIUM CHLORIDE CRYS ER 20 MEQ PO TBCR
40.0000 meq | EXTENDED_RELEASE_TABLET | Freq: Once | ORAL | Status: AC
  Administered 2021-07-07: 40 meq via ORAL
  Filled 2021-07-07: qty 2

## 2021-07-07 MED ORDER — FUROSEMIDE 10 MG/ML IJ SOLN
40.0000 mg | Freq: Once | INTRAMUSCULAR | Status: AC
  Administered 2021-07-07: 40 mg via INTRAVENOUS
  Filled 2021-07-07: qty 4

## 2021-07-07 MED ORDER — DIGOXIN 0.25 MG/ML IJ SOLN
0.1250 mg | Freq: Every day | INTRAMUSCULAR | Status: DC
  Administered 2021-07-08 – 2021-07-09 (×2): 0.125 mg via INTRAVENOUS
  Filled 2021-07-07 (×2): qty 2

## 2021-07-07 MED ORDER — DIGOXIN 0.25 MG/ML IJ SOLN
0.2500 mg | Freq: Once | INTRAMUSCULAR | Status: AC
  Administered 2021-07-07: 0.25 mg via INTRAVENOUS
  Filled 2021-07-07: qty 2

## 2021-07-07 NOTE — Progress Notes (Signed)
Progress Note  Patient Name: Martha Ramos Date of Encounter: 07/07/2021  [facility] Cardiologist: None   Subjective   Feeling better.  Denies chest pain.   Inpatient Medications    Scheduled Meds:  atorvastatin  20 mg Oral Daily   Chlorhexidine Gluconate Cloth  6 each Topical Q0600   DULoxetine  60 mg Oral Daily   furosemide  40 mg Intravenous Q12H   glipiZIDE  5 mg Oral BID AC   guaiFENesin  600 mg Oral BID   haloperidol  10 mg Oral QHS   insulin aspart  0-15 Units Subcutaneous TID AC & HS   insulin glargine  12 Units Subcutaneous QHS   mouth rinse  15 mL Mouth Rinse BID   metoprolol tartrate  12.5 mg Oral BID   pantoprazole  40 mg Oral Daily   potassium chloride SA  20 mEq Oral Daily   potassium phosphate (monobasic)  500 mg Oral BID   Continuous Infusions:  heparin 1,800 Units/hr (07/07/21 1000)   milrinone Stopped (07/06/21 1759)   PRN Meds: acetaminophen **OR** acetaminophen, albuterol, magnesium hydroxide, morphine injection, ondansetron **OR** ondansetron (ZOFRAN) IV, traZODone   Vital Signs    Vitals:   07/07/21 0800 07/07/21 0830 07/07/21 0915 07/07/21 1000  BP:  (!) 104/51 99/80 96/70   Pulse: (!) 106 (!) 105 (!) 125 (!) 121  Resp: 14 (!) 22 (!) 24 19  Temp:      TempSrc:      SpO2: 97% 97% 97% 95%  Weight:      Height:        Intake/Output Summary (Last 24 hours) at 07/07/2021 1056 Last data filed at 07/07/2021 1000 Gross per 24 hour  Intake 1585.25 ml  Output 252 ml  Net 1333.25 ml   Last 3 Weights 07/06/2021 07/05/2021 07/04/2021  Weight (lbs) 235 lb 10.8 oz 229 lb 0.9 oz 214 lb  Weight (kg) 106.9 kg 103.9 kg 97.07 kg      Telemetry    Sinus tachycardia.  Rate 110-150s.  7.3 seconds SVT - Personally Reviewed  ECG    N/a - Personally Reviewed  Physical Exam   VS:  BP 96/70   Pulse (!) 121   Temp (!) 97.5 F (36.4 C) (Oral)   Resp 19   Ht 5\' 4"  (1.626 m)   Wt 106.9 kg   SpO2 95%   BMI 40.45 kg/m  , BMI Body mass index  is 40.45 kg/m. GENERAL:  Ill-appearing.  Laying flat comfortably. HEENT: Pupils equal round and reactive, fundi not visualized, oral mucosa unremarkable NECK:  JVP at earlobe laying flat. Waveform within normal limits, carotid upstroke brisk and symmetric, no bruits, no thyromegaly LYMPHATICS:  No cervical adenopathy LUNGS:  Clear to auscultation bilaterally HEART: Tachycardic.  Regular rhythm.  PMI not displaced or sustained,S1 and S2 within normal limits, no S3, no S4, no clicks, no rubs, no murmurs ABD:  Flat, positive bowel sounds normal in frequency in pitch, no bruits, no rebound, no guarding, no midline pulsatile mass, no hepatomegaly, no splenomegaly EXT:  2 plus pulses throughout, no edema, no cyanosis no clubbing SKIN:  No rashes no nodules NEURO:  Cranial nerves II through XII grossly intact, motor grossly intact throughout PSYCH:  Cognitively intact, oriented to person place and time   Labs    High Sensitivity Troponin:   Recent Labs  Lab 07/04/21 1942 07/04/21 2138  TROPONINIHS 101* 92*      Chemistry Recent Labs  Lab 07/04/21 1942 07/05/21 0546  07/06/21 0725 07/07/21 0540  NA 136 137 136 135  K 3.9 4.1 4.4 4.5  CL 103 106 103 101  CO2 22 24 23 24   GLUCOSE 339* 266* 213* 249*  BUN 13 12 15 14   CREATININE 0.87 0.64 0.68 0.69  CALCIUM 8.9 8.6* 8.9 8.8*  PROT 6.6 6.3*  --   --   ALBUMIN 3.3* 3.0*  --   --   AST 39 20  --   --   ALT 49* 40  --   --   ALKPHOS 65 58  --   --   BILITOT 0.8 0.8  --   --   GFRNONAA >60 >60 >60 >60  ANIONGAP 11 7 10 10      Hematology Recent Labs  Lab 07/05/21 0546 07/06/21 0725 07/07/21 0540  WBC 14.6* 15.3* 13.7*  RBC 4.01 4.05 3.72*  HGB 11.6* 12.0 11.1*  HCT 36.5 36.9 34.5*  MCV 91.0 91.1 92.7  MCH 28.9 29.6 29.8  MCHC 31.8 32.5 32.2  RDW 14.8 14.9 14.6  PLT 216 320 338    BNP Recent Labs  Lab 07/04/21 1942 07/05/21 0546 07/06/21 0837  BNP 1,128.3* 1,356.8* 1,430.3*     DDimer No results for input(s):  DDIMER in the last 168 hours.   Radiology    07/06/21 Venous Img Lower Bilateral (DVT)  Result Date: 07/05/2021 CLINICAL DATA:  50 year old female with PE. Evaluate for residual DVT. EXAM: BILATERAL LOWER EXTREMITY VENOUS DOPPLER ULTRASOUND TECHNIQUE: Gray-scale sonography with graded compression, as well as color Doppler and duplex ultrasound were performed to evaluate the lower extremity deep venous systems from the level of the common femoral vein and including the common femoral, femoral, profunda femoral, popliteal and calf veins including the posterior tibial, peroneal and gastrocnemius veins when visible. The superficial great saphenous vein was also interrogated. Spectral Doppler was utilized to evaluate flow at rest and with distal augmentation maneuvers in the common femoral, femoral and popliteal veins. COMPARISON:  None. FINDINGS: RIGHT LOWER EXTREMITY Common Femoral Vein: No evidence of thrombus. Normal compressibility, respiratory phasicity and response to augmentation. Saphenofemoral Junction: No evidence of thrombus. Normal compressibility and flow on color Doppler imaging. Profunda Femoral Vein: No evidence of thrombus. Normal compressibility and flow on color Doppler imaging. Femoral Vein: No evidence of thrombus. Normal compressibility, respiratory phasicity and response to augmentation. Popliteal Vein: No evidence of thrombus. Normal compressibility, respiratory phasicity and response to augmentation. Calf Veins: No evidence of thrombus. Normal compressibility and flow on color Doppler imaging. Superficial Great Saphenous Vein: No evidence of thrombus. Normal compressibility. Venous Reflux:  None. Other Findings:  None. LEFT LOWER EXTREMITY Common Femoral Vein: No evidence of thrombus. Normal compressibility, respiratory phasicity and response to augmentation. Saphenofemoral Junction: No evidence of thrombus. Normal compressibility and flow on color Doppler imaging. Profunda Femoral Vein: No  evidence of thrombus. Normal compressibility and flow on color Doppler imaging. Femoral Vein: No evidence of thrombus. Normal compressibility, respiratory phasicity and response to augmentation. Popliteal Vein: No evidence of thrombus. Normal compressibility, respiratory phasicity and response to augmentation. Calf Veins: No evidence of thrombus. Normal compressibility and flow on color Doppler imaging. Superficial Great Saphenous Vein: No evidence of thrombus. Normal compressibility. Venous Reflux:  None. Other Findings:  None. IMPRESSION: No evidence of deep venous thrombosis in either lower extremity. Electronically Signed   By: [place] M.D.   On: 07/05/2021 11:04   44 Venous Img Upper Uni Right(DVT)  Result Date: 07/05/2021 CLINICAL DATA:  Pulmonary embolus, right upper  extremity pain and edema EXAM: RIGHT UPPER EXTREMITY VENOUS DOPPLER ULTRASOUND TECHNIQUE: Gray-scale sonography with graded compression, as well as color Doppler and duplex ultrasound were performed to evaluate the upper extremity deep venous system from the level of the subclavian vein and including the jugular, axillary, basilic, radial, ulnar and upper cephalic vein. Spectral Doppler was utilized to evaluate flow at rest and with distal augmentation maneuvers. COMPARISON:  None. FINDINGS: Contralateral Subclavian Vein: Respiratory phasicity is normal and symmetric with the symptomatic side. No evidence of thrombus. Normal compressibility. Internal Jugular Vein: No evidence of thrombus. Normal compressibility, respiratory phasicity and response to augmentation. Subclavian Vein: No evidence of thrombus. Normal compressibility, respiratory phasicity and response to augmentation. Axillary Vein: No evidence of thrombus. Normal compressibility, respiratory phasicity and response to augmentation. Cephalic Vein: No evidence of thrombus. Normal compressibility, respiratory phasicity and response to augmentation. Basilic Vein: No evidence  of thrombus. Normal compressibility, respiratory phasicity and response to augmentation. Brachial Veins: No evidence of thrombus. Normal compressibility, respiratory phasicity and response to augmentation. Radial Veins: No evidence of thrombus. Normal compressibility, respiratory phasicity and response to augmentation. Ulnar Veins: No evidence of thrombus. Normal compressibility, respiratory phasicity and response to augmentation. IMPRESSION: No evidence of DVT within the right upper extremity. Electronically Signed   By: Judie Petit.  Shick M.D.   On: 07/05/2021 12:33   DG Chest Port 1 View  Result Date: 07/06/2021 CLINICAL DATA:  Check line placement EXAM: PORTABLE CHEST 1 VIEW COMPARISON:  July 04, 2021 FINDINGS: The new right central line terminates just in the right side of the atrium, approximately 2 cm above the caval atrial junction. No pneumothorax. No other changes. IMPRESSION: The new central line terminates 2 cm distal to the caval atrial junction. Recommend withdrawing 2 cm. No pneumothorax. No other change. These results will be called to the ordering clinician or representative by the Radiologist Assistant, and communication documented in the PACS or [employer]. Electronically Signed   By: Gerome Sam III M.D   On: 07/06/2021 16:00   [place] EKG SITE RITE  Result Date: 07/06/2021 If Site Rite image not attached, placement could not be confirmed due to current cardiac rhythm.   Cardiac Studies   Echo 07/05/21: 1. Severely dilated systolic cardiomyopathy.   2. Left ventricular ejection fraction, by estimation, is <20%. The left  ventricle has severely decreased function. The left ventricle has no  regional wall motion abnormalities. The left ventricular internal cavity  size was severely dilated. Left  ventricular diastolic parameters were normal.   3. Right ventricular systolic function is severely reduced. The right  ventricular size is moderately enlarged.   4. Left atrial size was  moderately dilated.   5. Right atrial size was moderately dilated.   6. The mitral valve is grossly normal. Moderate mitral valve  regurgitation.   7. Tricuspid valve regurgitation is moderate.   8. The aortic valve is grossly normal. Aortic valve regurgitation is not  visualized.     [facility] 03/2020: 1. Technically difficult study due to chest wall/lung interference.    2. The left ventricle is upper normal in size with normal wall thickness.    3. The left ventricular systolic function is overall normal, LVEF is  visually estimated at 50-55%.    4. The right ventricle is normal in size, with low normal systolic function.    5. No significant valvular abnormalities.    Patient Profile     Tamekia Potosky is a 50 y.o. 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   Assessment & Plan    # Acute pulmonary embolism:  # RV failure:  Patient was admitted with symptoms attributable to acute pulmonary embolism.  There is evidence of RV dysfunction on echo.  Right atrial pressure was 15 mmHg and CT showed ascites as well as subcutaneous edema.  She definitely needs diuresis.  She was very hypotensive on 7/16 and did not tolerate diuresis.  Milrinone was started but she became tachycardic so she was switched to levophed.  BP stabilized and this was weaned off.  CVP is 20-21 and Co-ox was 53, consistent with cardiogenic shock.  We will load her with digoxin.  Increase lasix to 80mg  IV bid as she remains net positive.  If she does not diurese with this, would transfer to for management assistance from the Advanced Heart Failure team.   # Acute systolic and diastolic heart failure: # Cardiogenic shock: Patient presented with new onset LVEF less than 20%.  Prior echo 03/2020 revealed normal systolic function.  She denied heart failure symptoms prior to this  admission.  However, her LV was dilated, suggesting that this may have been more chronic.  She does report a history of MI, though I do not see any documentation of this.  She reports that it happened at [facility] and it does not appear she has ever seen a cardiologist.  On my review of her chest CT, I do not see any stents and she does not even have any significant coronary calcification.  Recommend an ischemic evaluation with right and left heart catheterization once she is more euvolemic and stable.  She denies EtOH and no recent viral illness as the cause of her cardiomyopathy.  Co-ox yesterday was 53.  She is ordered for daily co-ox but none have been sent yet. Will order and attempt to diurese as above.  CVP is 20.  Starting digoxin as above.  Will check a level in 3 days.  Given her CVP, Co-ox and normal procalcitonin, this is consistent with cardiogenic and not septic shock.     Total critical care time: 35 minutes. Critical care time was exclusive of separately billable procedures and treating other patients. Critical care was necessary to treat or prevent imminent or life-threatening deterioration. Critical care was time spent personally by me on the following activities: development of treatment plan with patient and/or surrogate as well as nursing, discussions with consultants, evaluation of patient's response to treatment, examination of patient, obtaining history from patient or surrogate, ordering and performing treatments and interventions, ordering and review of laboratory studies, ordering and review of radiographic studies, pulse oximetry and re-evaluation of patient's condition.   For questions or updates, please contact [facility] Please consult [email or web address] for contact info under        Signed, [facility], MD  07/07/2021, 10:56 AM

## 2021-07-07 NOTE — Consult Note (Signed)
ANTICOAGULATION CONSULT NOTE  Pharmacy Consult for Heparin infusion Indication: pulmonary embolus  Patient Measurements: Heparin Dosing Weight: 79 kg   Labs: Recent Labs    07/04/21 1942 07/04/21 2138 07/05/21 0546 07/05/21 1305 07/06/21 0725 07/06/21 2250 07/07/21 0540 07/07/21 1255 07/07/21 1507  HGB 12.3  --  11.6*  --  12.0  --  11.1*  --   --   HCT 39.0  --  36.5  --  36.9  --  34.5*  --   --   PLT 372  --  216  --  320  --  338  --   --   APTT 28  --   --   --   --   --   --   --   --   LABPROT 14.0  --   --   --   --   --   --   --   --   INR 1.1  --   --   --   --   --   --   --   --   HEPARINUNFRC  --   --  0.21*   < > <0.10* 0.91* 0.97* 0.90*  --   CREATININE 0.87  --  0.64  --  0.68  --  0.69  --  0.76  TROPONINIHS 101* 92*  --   --   --   --   --   --   --    < > = values in this interval not displayed.    Estimated Creatinine Clearance: 100.4 mL/min (by C-G formula based on SCr of 0.76 mg/dL).   Medical History: Past Medical History:  Diagnosis Date   Asthma    CAD (coronary artery disease)    Diabetes (HCC)    Hypertension    Medications:  No prior anticoagulation noted  Assessment: 50 y/o female with Hx of recent MI, presents with chest pain. CT positive for RLL PE without evidence of right heart strain. Pharmacy has been consulted for initiation and management of heparin infusion for PE.  Goal of Therapy:  Heparin level 0.3-0.7 units/ml Monitor platelets by anticoagulation protocol: Yes  7/15 0546 HL 0.21, subtherapeutic - 1350 units/hr 7/15 1305 HL 0.22, subtherapeutic - 1500 units/hr 7/15 2109 HL 0.22, subtherapeutic - 1650 units/hr 7/16 0725 HL <0.10, subtherapeutic -inc to 2100 units/hr 7/16 2250 HL 0.91, supratherapeutic dec to 1950 units/hr 7/17 0540 HL 0.97 supratherapeutic  dec to 1800 units/hr 7/17 1255 HL 0.90 Supratherapeutic       Recheck level w/ periph stick  (unable to get periph stick w/ 4 attempts.) Will decrease drip rate to  1650 units/hr   Plan:  ---7/17 1255 HL 0.90  Heparin level is SUPRAtherapeutic --checked with RN and level was drawn from Central line while Heparin drip running peripherally. Will order a level now by peripherally sticking patient to confim. (Lab unable to get periph stick w/ 4 attempts per RN) Will decrease drip rate to 1650 units/hr --Re-check HL 6 hours after rate change --Daily CBC per protocol while on IV heparin  Bari Mantis PharmD Clinical Pharmacist 07/07/2021

## 2021-07-07 NOTE — Progress Notes (Signed)
PROGRESS NOTE    Martha Ramos  NUU:[id number] DOB: February 03, 1971 DOA: 07/04/2021 PCP: Darnelle Spangle, MD   Chief complaint.  Chest pain. Brief Narrative:  Martha Ramos is a 50 y.o. African American female with medical history significant for asthma, type 2 diabetes mellitus, hypertension and coronary artery disease, who presented to the emergency room with acute onset of midsternal chest pain with associated dyspnea as well as cough occasionally productive of clear sputum and occasional wheezing. Upon arriving in the hospital, patient had a severe tachycardia, with heart rate in the 120s, leukocytosis of 20.7, lactic acidosis of 3.2.  CT angiogram of the chest showed a right lower lobe pulmonary emboli.  Mild cardiomegaly with evidence of right heart dysfunction.  Small bilateral pleural effusion with basilar atelectasis.  BNP elevated at 1128.3.  Troponin 101.  Patient was placed on IV Lasix.  She is also treated with Rocephin and Zithromax.     Assessment & Plan:   Active Problems:   Acute pulmonary embolism (HCC)   Acute on chronic diastolic CHF (congestive heart failure) (HCC)   Uncontrolled type 2 diabetes mellitus with hyperglycemia (HCC)   Acute on chronic systolic CHF (congestive heart failure) (HCC)   Acute combined systolic and diastolic heart failure (HCC)   Cardiogenic shock (HCC)  #1.  Acute right lower lobe pulmonary emboli. Elevated troponin secondary to PE. Negative DVT. Continue heparin drip.  2.  Acute on chronic systolic congestive heart failure. Right-sided congestive heart failure. Sinus tachycardia. Concerning for cardiogenic shock.  Followed by cardiology. Restart IV Lasix.  Patient vital signs seem to be more stable today.  3.  Uncontrolled type 2 diabetes with hyperglycemia Glucose still running high, will increase Lantus dose.  4.  Lactic acidosis. Appear to be secondary to metformin, no evidence of infection.   DVT prophylaxis:  Heparin Code Status: full Family Communication:  Disposition Plan:    Status is: Inpatient  Remains inpatient appropriate because:IV treatments appropriate due to intensity of illness or inability to take PO and Inpatient level of care appropriate due to severity of illness  Dispo: The patient is from: Home              Anticipated d/c is to: Home              Patient currently is not medically stable to d/c.   Difficult to place patient No        I/O last 3 completed shifts: In: 1810.9 [P.O.:1200; I.V.:510.8; IV Piggyback:100.1] Out: 577 [Urine:576; Stool:1] Total I/O In: 568.2 [P.O.:240; I.V.:328.2] Out: 1375 [Urine:1375]     Consultants:  Card, ICU  Procedures:   Antimicrobials: None  Subjective: Patient denies any short of breath today, no chest pain. No abdominal pain or nausea vomiting. No dysuria hematuria  No fever or chills. No headache or dizziness.  Objective: Vitals:   07/07/21 1000 07/07/21 1100 07/07/21 1200 07/07/21 1300  BP: 96/70 (!) 106/93 109/65 106/64  Pulse: (!) 121 (!) 118 (!) 125 (!) 124  Resp: 19 18 (!) 29 (!) 23  Temp:   98 F (36.7 C)   TempSrc:   Oral   SpO2: 95% 99% 97% 100%  Weight:      Height:        Intake/Output Summary (Last 24 hours) at 07/07/2021 1308 Last data filed at 07/07/2021 1300 Gross per 24 hour  Intake 1278.66 ml  Output 1500 ml  Net -221.34 ml   Filed Weights   07/04/21 1912 07/05/21 0200 07/06/21 0139  Weight: 97.1 kg 103.9 kg 106.9 kg    Examination:  General exam: Appears calm and comfortable  Respiratory system: Decreased breathing sounds. Respiratory effort normal. Cardiovascular system: Regular and tachycardic.  No JVD, murmurs, rubs, gallops or clicks. No pedal edema. Gastrointestinal system: Abdomen is nondistended, soft and nontender. No organomegaly or masses felt. Normal bowel sounds heard. Central nervous system: Alert and oriented. No focal neurological deficits. Extremities: Symmetric  5 x 5 power. Skin: No rashes, lesions or ulcers Psychiatry: Judgement and insight appear normal. Mood & affect appropriate.     Data Reviewed: I have personally reviewed following labs and imaging studies  CBC: Recent Labs  Lab 07/04/21 1942 07/05/21 0546 07/06/21 0725 07/07/21 0540  WBC 20.7* 14.6* 15.3* 13.7*  NEUTROABS 17.6*  --  11.8* 9.8*  HGB 12.3 11.6* 12.0 11.1*  HCT 39.0 36.5 36.9 34.5*  MCV 92.2 91.0 91.1 92.7  PLT 372 216 320 338   Basic Metabolic Panel: Recent Labs  Lab 07/04/21 1942 07/05/21 0546 07/06/21 0725 07/07/21 0540  NA 136 137 136 135  K 3.9 4.1 4.4 4.5  CL 103 106 103 101  CO2 22 24 23 24   GLUCOSE 339* 266* 213* 249*  BUN 13 12 15 14   CREATININE 0.87 0.64 0.68 0.69  CALCIUM 8.9 8.6* 8.9 8.8*  MG 1.4* 1.8 1.8 1.9   GFR: Estimated Creatinine Clearance: 100.4 mL/min (by C-G formula based on SCr of 0.69 mg/dL). Liver Function Tests: Recent Labs  Lab 07/04/21 1942 07/05/21 0546  AST 39 20  ALT 49* 40  ALKPHOS 65 58  BILITOT 0.8 0.8  PROT 6.6 6.3*  ALBUMIN 3.3* 3.0*   Recent Labs  Lab 07/04/21 1942  LIPASE 34   No results for input(s): AMMONIA in the last 168 hours. Coagulation Profile: Recent Labs  Lab 07/04/21 1942  INR 1.1   Cardiac Enzymes: No results for input(s): CKTOTAL, CKMB, CKMBINDEX, TROPONINI in the last 168 hours. BNP (last 3 results) No results for input(s): PROBNP in the last 8760 hours. HbA1C: Recent Labs    07/05/21 0937 07/06/21 0725  HGBA1C 6.2* 6.2*   CBG: Recent Labs  Lab 07/06/21 1107 07/06/21 1510 07/06/21 2224 07/07/21 0739 07/07/21 1120  GLUCAP 226* 236* 220* 225* 228*   Lipid Profile: No results for input(s): CHOL, HDL, LDLCALC, TRIG, CHOLHDL, LDLDIRECT in the last 72 hours. Thyroid Function Tests: Recent Labs    07/05/21 0546  TSH 1.204   Anemia Panel: No results for input(s): VITAMINB12, FOLATE, FERRITIN, TIBC, IRON, RETICCTPCT in the last 72 hours. Sepsis Labs: Recent Labs   Lab 07/04/21 1942 07/05/21 0037 07/05/21 0546 07/06/21 0837  PROCALCITON  --   --  0.23  --   LATICACIDVEN 3.2* 2.6*  --  1.9    Recent Results (from the past 240 hour(s))  Resp Panel by RT-PCR (Flu A&B, Covid) Nasopharyngeal Swab     Status: None   Collection Time: 07/04/21  9:38 PM   Specimen: Nasopharyngeal Swab; Nasopharyngeal(NP) swabs in vial transport medium  Result Value Ref Range Status   SARS Coronavirus 2 by RT PCR NEGATIVE NEGATIVE Final    Comment: (NOTE) SARS-CoV-2 target nucleic acids are NOT DETECTED.  The SARS-CoV-2 RNA is generally detectable in upper respiratory specimens during the acute phase of infection. The lowest concentration of SARS-CoV-2 viral copies this assay can detect is 138 copies/mL. A negative result does not preclude SARS-Cov-2 infection and should not be used as the sole basis for treatment or other patient management  decisions. A negative result may occur with  improper specimen collection/handling, submission of specimen other than nasopharyngeal swab, presence of viral mutation(s) within the areas targeted by this assay, and inadequate number of viral copies(<138 copies/mL). A negative result must be combined with clinical observations, patient history, and epidemiological information. The expected result is Negative.  Fact Sheet for Patients:  BloggerCourse.com  Fact Sheet for Healthcare Providers:  SeriousBroker.it  This test is no t yet approved or cleared by the [place] FDA and  has been authorized for detection and/or diagnosis of SARS-CoV-2 by FDA under an Emergency Use Authorization (EUA). This EUA will remain  in effect (meaning this test can be used) for the duration of the COVID-19 declaration under Section 564(b)(1) of the Act, 21 U.S.C.section 360bbb-3(b)(1), unless the authorization is terminated  or revoked sooner.       Influenza A by PCR NEGATIVE NEGATIVE  Final   Influenza B by PCR NEGATIVE NEGATIVE Final    Comment: (NOTE) The Xpert Xpress SARS-CoV-2/FLU/RSV plus assay is intended as an aid in the diagnosis of influenza from Nasopharyngeal swab specimens and should not be used as a sole basis for treatment. Nasal washings and aspirates are unacceptable for Xpert Xpress SARS-CoV-2/FLU/RSV testing.  Fact Sheet for Patients: BloggerCourse.com  Fact Sheet for Healthcare Providers: SeriousBroker.it  This test is not yet approved or cleared by the [place] FDA and has been authorized for detection and/or diagnosis of SARS-CoV-2 by FDA under an Emergency Use Authorization (EUA). This EUA will remain in effect (meaning this test can be used) for the duration of the COVID-19 declaration under Section 564(b)(1) of the Act, 21 U.S.C. section 360bbb-3(b)(1), unless the authorization is terminated or revoked.  Performed at [facility], [address] [place], [place] [address]   Culture, blood (routine x 2)     Status: None (Preliminary result)   Collection Time: 07/04/21 10:01 PM   Specimen: BLOOD  Result Value Ref Range Status   Specimen Description BLOOD BLOOD RIGHT HAND  Final   Special Requests   Final    BOTTLES DRAWN AEROBIC AND ANAEROBIC Blood Culture adequate volume   Culture   Final    NO GROWTH 3 DAYS Performed at [facility], [address], [place], [place] [address]    Report Status PENDING  Incomplete  Culture, blood (routine x 2)     Status: None (Preliminary result)   Collection Time: 07/04/21 10:01 PM   Specimen: BLOOD  Result Value Ref Range Status   Specimen Description BLOOD RIGHT ANTECUBITAL  Final   Special Requests   Final    BOTTLES DRAWN AEROBIC AND ANAEROBIC Blood Culture adequate volume   Culture   Final    NO GROWTH 3 DAYS Performed at [facility], [address] [place], [place] [address]    Report Status PENDING   Incomplete  Urine Culture     Status: None   Collection Time: 07/04/21 11:30 PM   Specimen: Urine, Catheterized  Result Value Ref Range Status   Specimen Description   Final    URINE, CATHETERIZED Performed at [facility], [address] [place], [place] [address]    Special Requests   Final    NONE Performed at [facility], [address] [place], [place] [address]    Culture   Final    NO GROWTH Performed at [facility], [address] [place], [place] [address]    Report Status 07/06/2021 FINAL  Final  MRSA Next Gen by PCR, Nasal     Status: None   Collection Time: 07/05/21  2:15 AM   Specimen: Nasal Mucosa; Nasal Swab  Result Value Ref Range Status   MRSA by PCR Next Gen NOT DETECTED NOT DETECTED Final    Comment: (NOTE) The GeneXpert MRSA Assay (FDA approved for NASAL specimens only), is one component of a comprehensive MRSA colonization surveillance program. It is not intended to diagnose MRSA infection nor to guide or monitor treatment for MRSA infections. Test performance is not FDA approved in patients less than 30 years old. Performed at [facility], [address] [place], [place] [address]          Radiology Studies: DG Chest Port 1 View  Result Date: 07/06/2021 CLINICAL DATA:  Check line placement EXAM: PORTABLE CHEST 1 VIEW COMPARISON:  July 04, 2021 FINDINGS: The new right central line terminates just in the right side of the atrium, approximately 2 cm above the caval atrial junction. No pneumothorax. No other changes. IMPRESSION: The new central line terminates 2 cm distal to the caval atrial junction. Recommend withdrawing 2 cm. No pneumothorax. No other change. These results will be called to the ordering clinician or representative by the Radiologist Assistant, and communication documented in the PACS or [employer]. Electronically Signed   By: Gerome Sam III M.D   On: 07/06/2021 16:00   [place] EKG SITE  RITE  Result Date: 07/06/2021 If Site Rite image not attached, placement could not be confirmed due to current cardiac rhythm.       Scheduled Meds:  atorvastatin  20 mg Oral Daily   Chlorhexidine Gluconate Cloth  6 each Topical Q0600   digoxin  0.125 mg Intravenous Q6H   [START ON 07/08/2021] digoxin  0.125 mg Intravenous Daily   DULoxetine  60 mg Oral Daily   furosemide  80 mg Intravenous Q12H   glipiZIDE  5 mg Oral BID AC   haloperidol  10 mg Oral QHS   insulin aspart  0-15 Units Subcutaneous TID AC & HS   insulin glargine  12 Units Subcutaneous QHS   mouth rinse  15 mL Mouth Rinse BID   metoprolol tartrate  12.5 mg Oral BID   pantoprazole  40 mg Oral Daily   potassium chloride SA  20 mEq Oral Daily   potassium phosphate (monobasic)  500 mg Oral BID   Continuous Infusions:  heparin 1,800 Units/hr (07/07/21 1300)   milrinone Stopped (07/06/21 1759)     LOS: 3 days    Time spent: 32 minutes    Marrion Coy, MD [facility]   To contact the attending provider between 7A-7P or the covering provider during after hours 7P-7A, please log into the web site [email or web address] and access using universal [place] password for that web site. If you do not have the password, please call the hospital operator.  07/07/2021, 1:08 PM

## 2021-07-07 NOTE — Progress Notes (Signed)
Pt Sinus tachycardia during shift 120-150's. Pt given Metoprolol IV push x 2, and started PO metoprolol. All other VSS.Pt placed on CPAP at night. Pt continues to have chest pain primarily during inspiration, treated as appropriate. Foley placed. Heparin drip titrated per orders. Provider in communication and at bedside for intermittent assessment and treated as appropriate. Handoff report given to [facility]. Relinquishing care

## 2021-07-07 NOTE — Consult Note (Addendum)
ANTICOAGULATION CONSULT NOTE  Pharmacy Consult for Heparin infusion Indication: pulmonary embolus  Patient Measurements: Heparin Dosing Weight: 79 kg   Labs: Recent Labs    07/04/21 1942 07/04/21 2138 07/05/21 0546 07/05/21 1305 07/06/21 0725 07/06/21 2250 07/07/21 0540 07/07/21 1255  HGB 12.3  --  11.6*  --  12.0  --  11.1*  --   HCT 39.0  --  36.5  --  36.9  --  34.5*  --   PLT 372  --  216  --  320  --  338  --   APTT 28  --   --   --   --   --   --   --   LABPROT 14.0  --   --   --   --   --   --   --   INR 1.1  --   --   --   --   --   --   --   HEPARINUNFRC  --   --  0.21*   < > <0.10* 0.91* 0.97* 0.90*  CREATININE 0.87  --  0.64  --  0.68  --  0.69  --   TROPONINIHS 101* 92*  --   --   --   --   --   --    < > = values in this interval not displayed.    Estimated Creatinine Clearance: 100.4 mL/min (by C-G formula based on SCr of 0.69 mg/dL).   Medical History: Past Medical History:  Diagnosis Date   Asthma    CAD (coronary artery disease)    Diabetes (HCC)    Hypertension    Medications:  No prior anticoagulation noted  Assessment: 50 y/o female with Hx of recent MI, presents with chest pain. CT positive for RLL PE without evidence of right heart strain. Pharmacy has been consulted for initiation and management of heparin infusion for PE.  Goal of Therapy:  Heparin level 0.3-0.7 units/ml Monitor platelets by anticoagulation protocol: Yes  7/15 0546 HL 0.21, subtherapeutic - 1350 units/hr 7/15 1305 HL 0.22, subtherapeutic - 1500 units/hr 7/15 2109 HL 0.22, subtherapeutic - 1650 units/hr 7/16 0725 HL <0.10, subtherapeutic -inc to 2100 units/hr 7/16 2250 HL 0.91, supratherapeutic dec to 1950 units/hr 7/17 0540 HL 0.97 supratherapeutic  dec to 1800 units/hr 7/17 1255 HL 0.90 Supra       Recheck level w/ periph stick   Plan:  --Heparin level is SUPRAtherapeutic --checked with RN and level was drawn from Central line while Heparin drip running  peripherally. Will order a level now by peripherally sticking patient to confim. --Re-check HL 6 hours after rate change --Daily CBC per protocol while on IV heparin  Bari Mantis PharmD Clinical Pharmacist 07/07/2021

## 2021-07-07 NOTE — Consult Note (Signed)
ANTICOAGULATION CONSULT NOTE  Pharmacy Consult for Heparin infusion Indication: pulmonary embolus  Patient Measurements: Heparin Dosing Weight: 77 kg   Labs: Recent Labs    07/04/21 1942 07/04/21 2138 07/05/21 0546 07/05/21 1305 07/06/21 0725 07/06/21 2250 07/07/21 0540  HGB 12.3  --  11.6*  --  12.0  --  11.1*  HCT 39.0  --  36.5  --  36.9  --  34.5*  PLT 372  --  216  --  320  --  338  APTT 28  --   --   --   --   --   --   LABPROT 14.0  --   --   --   --   --   --   INR 1.1  --   --   --   --   --   --   HEPARINUNFRC  --   --  0.21*   < > <0.10* 0.91* 0.97*  CREATININE 0.87  --  0.64  --  0.68  --  0.69  TROPONINIHS 101* 92*  --   --   --   --   --    < > = values in this interval not displayed.    Estimated Creatinine Clearance: 100.4 mL/min (by C-G formula based on SCr of 0.69 mg/dL).   Medical History: Past Medical History:  Diagnosis Date   Asthma    CAD (coronary artery disease)    Diabetes (HCC)    Hypertension    Medications:  No prior anticoagulation noted  Assessment: 50 y/o female with Hx of recent MI, presents with chest pain. CT positive for RLL PE without evidence of right heart strain. Pharmacy has been consulted for initiation and management of heparin infusion for PE.  Goal of Therapy:  Heparin level 0.3-0.7 units/ml Monitor platelets by anticoagulation protocol: Yes  7/15 0546 HL 0.21, subtherapeutic - 1350 units/hr 7/15 1305 HL 0.22, subtherapeutic - 1500 units/hr 7/15 2109 HL 0.22, subtherapeutic - 1650 units/hr 7/16 0725 HL <0.10, subtherapeutic -1800 units/hr 7/16 2250 HL 0.91, supratherapeutic 7/17 0540 HL 0.97 supratherapeutic   Plan:  --Heparin level is subtherapeutic --Will decrease heparin infusion rate to 1800 units/hr --Re-check HL 6 hours after rate change --Daily CBC per protocol while on IV heparin  Otelia Sergeant, PharmD, [facility] 07/07/2021 6:47 AM

## 2021-07-08 ENCOUNTER — Encounter: Payer: Self-pay | Admitting: Family Medicine

## 2021-07-08 DIAGNOSIS — I2693 Single subsegmental pulmonary embolism without acute cor pulmonale: Secondary | ICD-10-CM

## 2021-07-08 DIAGNOSIS — R570 Cardiogenic shock: Secondary | ICD-10-CM | POA: Diagnosis not present

## 2021-07-08 DIAGNOSIS — I2699 Other pulmonary embolism without acute cor pulmonale: Secondary | ICD-10-CM | POA: Diagnosis not present

## 2021-07-08 DIAGNOSIS — I50810 Right heart failure, unspecified: Secondary | ICD-10-CM | POA: Diagnosis not present

## 2021-07-08 DIAGNOSIS — I5043 Acute on chronic combined systolic (congestive) and diastolic (congestive) heart failure: Secondary | ICD-10-CM | POA: Diagnosis not present

## 2021-07-08 DIAGNOSIS — I2609 Other pulmonary embolism with acute cor pulmonale: Secondary | ICD-10-CM | POA: Diagnosis not present

## 2021-07-08 DIAGNOSIS — I420 Dilated cardiomyopathy: Secondary | ICD-10-CM | POA: Diagnosis not present

## 2021-07-08 DIAGNOSIS — E1165 Type 2 diabetes mellitus with hyperglycemia: Secondary | ICD-10-CM | POA: Diagnosis not present

## 2021-07-08 LAB — BASIC METABOLIC PANEL
Anion gap: 5 (ref 5–15)
BUN: 12 mg/dL (ref 6–20)
CO2: 30 mmol/L (ref 22–32)
Calcium: 8.7 mg/dL — ABNORMAL LOW (ref 8.9–10.3)
Chloride: 103 mmol/L (ref 98–111)
Creatinine, Ser: 0.65 mg/dL (ref 0.44–1.00)
GFR, Estimated: >60 mL/min (ref >60)
Glucose, Bld: 86 mg/dL (ref 70–99)
Potassium: 3.8 mmol/L (ref 3.5–5.1)
Sodium: 138 mmol/L (ref 135–145)

## 2021-07-08 LAB — CBC
HCT: 33.7 % — ABNORMAL LOW (ref 36.0–46.0)
Hemoglobin: 11.2 g/dL — ABNORMAL LOW (ref 12.0–15.0)
MCH: 29.6 pg (ref 26.0–34.0)
MCHC: 33.2 g/dL (ref 30.0–36.0)
MCV: 89.2 fL (ref 80.0–100.0)
Platelets: 347 10*3/uL (ref 150–400)
RBC: 3.78 MIL/uL — ABNORMAL LOW (ref 3.87–5.11)
RDW: 14.5 % (ref 11.5–15.5)
WBC: 12.2 10*3/uL — ABNORMAL HIGH (ref 4.0–10.5)
nRBC: 0 % (ref 0.0–0.2)

## 2021-07-08 LAB — COOXEMETRY PANEL
Carboxyhemoglobin: 2.1 % — ABNORMAL HIGH (ref 0.5–1.5)
Methemoglobin: 0.1 % (ref 0.0–1.5)
O2 Saturation: 95.5 %
Total oxygen content: 95.1 mL/dL

## 2021-07-08 LAB — HEPARIN LEVEL (UNFRACTIONATED)
Heparin Unfractionated: 0.45 IU/mL (ref 0.30–0.70)
Heparin Unfractionated: 0.45 IU/mL (ref 0.30–0.70)
Heparin Unfractionated: 0.56 IU/mL (ref 0.30–0.70)
Heparin Unfractionated: 0.71 IU/mL — ABNORMAL HIGH (ref 0.30–0.70)

## 2021-07-08 LAB — MAGNESIUM: Magnesium: 1.7 mg/dL (ref 1.7–2.4)

## 2021-07-08 LAB — GLUCOSE, CAPILLARY
Glucose-Capillary: 94 mg/dL (ref 70–99)
Glucose-Capillary: 171 mg/dL — ABNORMAL HIGH (ref 70–99)
Glucose-Capillary: 152 mg/dL — ABNORMAL HIGH (ref 70–99)
Glucose-Capillary: 160 mg/dL — ABNORMAL HIGH (ref 70–99)

## 2021-07-08 LAB — PHOSPHORUS: Phosphorus: 3.8 mg/dL (ref 2.5–4.6)

## 2021-07-08 MED ORDER — ALBUMIN HUMAN 25 % IV SOLN
25.0000 g | Freq: Once | INTRAVENOUS | Status: AC
  Administered 2021-07-08: 25 g via INTRAVENOUS
  Filled 2021-07-08: qty 100

## 2021-07-08 MED ORDER — METOPROLOL TARTRATE 25 MG PO TABS
12.5000 mg | ORAL_TABLET | Freq: Two times a day (BID) | ORAL | Status: DC
  Administered 2021-07-08 (×3): 12.5 mg via ORAL
  Filled 2021-07-08 (×3): qty 1

## 2021-07-08 MED ORDER — AMIODARONE HCL IN DEXTROSE 360-4.14 MG/200ML-% IV SOLN
60.0000 mg/h | INTRAVENOUS | Status: DC
  Administered 2021-07-08 (×2): 60 mg/h via INTRAVENOUS
  Filled 2021-07-08: qty 200

## 2021-07-08 MED ORDER — METOPROLOL TARTRATE 5 MG/5ML IV SOLN
5.0000 mg | Freq: Once | INTRAVENOUS | Status: AC
  Administered 2021-07-08: 5 mg via INTRAVENOUS
  Filled 2021-07-08: qty 5

## 2021-07-08 MED ORDER — AMIODARONE HCL IN DEXTROSE 360-4.14 MG/200ML-% IV SOLN
30.0000 mg/h | INTRAVENOUS | Status: DC
  Administered 2021-07-08: 30 mg/h via INTRAVENOUS
  Filled 2021-07-08: qty 200

## 2021-07-08 MED ORDER — AMIODARONE LOAD VIA INFUSION
150.0000 mg | Freq: Once | INTRAVENOUS | Status: AC
  Administered 2021-07-08: 150 mg via INTRAVENOUS
  Filled 2021-07-08: qty 83.34

## 2021-07-08 MED ORDER — SODIUM CHLORIDE 0.9% FLUSH
3.0000 mL | Freq: Two times a day (BID) | INTRAVENOUS | Status: DC
Start: 2021-07-08 — End: 2021-07-12
  Administered 2021-07-08 – 2021-07-12 (×7): 3 mL via INTRAVENOUS

## 2021-07-08 NOTE — Consult Note (Signed)
ANTICOAGULATION CONSULT NOTE  Pharmacy Consult for Heparin infusion Indication: pulmonary embolus  Patient Measurements: Heparin Dosing Weight: 79 kg   Labs: Recent Labs    07/06/21 0725 07/06/21 2250 07/07/21 0540 07/07/21 1255 07/07/21 1507 07/08/21 0012 07/08/21 0505 07/08/21 1156 07/08/21 1830  HGB 12.0  --  11.1*  --   --   --  11.2*  --   --   HCT 36.9  --  34.5*  --   --   --  33.7*  --   --   PLT 320  --  338  --   --   --  347  --   --   HEPARINUNFRC <0.10*   < > 0.97*   < >  --    < > 0.71* 0.56 0.45  CREATININE 0.68  --  0.69  --  0.76  --  0.65  --   --    < > = values in this interval not displayed.    Estimated Creatinine Clearance: 98.7 mL/min (by C-G formula based on SCr of 0.65 mg/dL).   Medical History: Past Medical History:  Diagnosis Date   Asthma    CAD (coronary artery disease)    Diabetes (HCC)    Hypertension    Medications:  No prior anticoagulation noted  Assessment: 50 y/o female with Hx of recent MI, presents with chest pain. CT positive for RLL PE without evidence of right heart strain. Pharmacy has been consulted for initiation and management of heparin infusion for PE.  Goal of Therapy:  Heparin level 0.3-0.7 units/ml Monitor platelets by anticoagulation protocol: Yes  7/15 0546 HL 0.21, subtherapeutic - 1350 units/hr 7/15 1305 HL 0.22, subtherapeutic - 1500 units/hr 7/15 2109 HL 0.22, subtherapeutic - 1650 units/hr 7/16 0725 HL <0.10, subtherapeutic -inc to 2100 units/hr 7/16 2250 HL 0.91, supratherapeutic dec to 1950 units/hr 7/17 0540 HL 0.97 supratherapeutic  dec to 1800 units/hr 7/17 1255 HL 0.90 Supratherapeutic       Recheck level w/ periph stick  (unable to get periph stick w/ 4 attempts.) Will decrease drip rate to 1650 units/hr 7/18 0012 HL 0.45, therapeutic 7/18 0505 HL 0.71, supratherapeutic 7/18 1156 HL 0.56, therapeutic  7/18 1830 HL 0.45, therapeutic   Plan:  --HL therapeutic x2, however is trending down. Will  obtain another HL in 6 hours to confirm. Continue heparin infusion at 1550 units/hr --Daily CBC per protocol while on IV heparin  Raiford Noble, PharmD Clinical Pharmacist 07/08/2021 7:05 PM

## 2021-07-08 NOTE — Progress Notes (Signed)
Pt refused CPAP. Pt aware CPAP will be provided if she decides that she wants it.

## 2021-07-08 NOTE — Consult Note (Signed)
ANTICOAGULATION CONSULT NOTE  Pharmacy Consult for Heparin infusion Indication: pulmonary embolus  Patient Measurements: Heparin Dosing Weight: 79 kg   Labs: Recent Labs    07/06/21 0725 07/06/21 2250 07/07/21 0540 07/07/21 1255 07/07/21 1507 07/08/21 0012 07/08/21 0505 07/08/21 1156  HGB 12.0  --  11.1*  --   --   --  11.2*  --   HCT 36.9  --  34.5*  --   --   --  33.7*  --   PLT 320  --  338  --   --   --  347  --   HEPARINUNFRC <0.10*   < > 0.97*   < >  --  0.45 0.71* 0.56  CREATININE 0.68  --  0.69  --  0.76  --  0.65  --    < > = values in this interval not displayed.    Estimated Creatinine Clearance: 98.7 mL/min (by C-G formula based on SCr of 0.65 mg/dL).   Medical History: Past Medical History:  Diagnosis Date   Asthma    CAD (coronary artery disease)    Diabetes (HCC)    Hypertension    Medications:  No prior anticoagulation noted  Assessment: 50 y/o female with Hx of recent MI, presents with chest pain. CT positive for RLL PE without evidence of right heart strain. Pharmacy has been consulted for initiation and management of heparin infusion for PE.  Goal of Therapy:  Heparin level 0.3-0.7 units/ml Monitor platelets by anticoagulation protocol: Yes  7/15 0546 HL 0.21, subtherapeutic - 1350 units/hr 7/15 1305 HL 0.22, subtherapeutic - 1500 units/hr 7/15 2109 HL 0.22, subtherapeutic - 1650 units/hr 7/16 0725 HL <0.10, subtherapeutic -inc to 2100 units/hr 7/16 2250 HL 0.91, supratherapeutic dec to 1950 units/hr 7/17 0540 HL 0.97 supratherapeutic  dec to 1800 units/hr 7/17 1255 HL 0.90 Supratherapeutic       Recheck level w/ periph stick  (unable to get periph stick w/ 4 attempts.) Will decrease drip rate to 1650 units/hr 7/18 0012 HL 0.45, therapeutic 7/18 0505 HL 0.71, supratherapeutic 7/18 1156 HL 0.56, therapeutic   Plan:  --Continue heparin infusion at 1550 units/hr --Re-check HL at 1800 to confirm --Daily CBC per protocol while on IV  heparin  Laureen Ochs, PharmD, BCPS 07/08/2021 12:48 PM

## 2021-07-08 NOTE — Consult Note (Signed)
ANTICOAGULATION CONSULT NOTE  Pharmacy Consult for Heparin infusion Indication: pulmonary embolus  Patient Measurements: Heparin Dosing Weight: 79 kg   Labs: Recent Labs    07/06/21 0725 07/06/21 2250 07/07/21 0540 07/07/21 1255 07/07/21 1507 07/08/21 0012 07/08/21 0505  HGB 12.0  --  11.1*  --   --   --  11.2*  HCT 36.9  --  34.5*  --   --   --  33.7*  PLT 320  --  338  --   --   --  347  HEPARINUNFRC <0.10*   < > 0.97* 0.90*  --  0.45 0.71*  CREATININE 0.68  --  0.69  --  0.76  --  0.65   < > = values in this interval not displayed.    Estimated Creatinine Clearance: 98.7 mL/min (by C-G formula based on SCr of 0.65 mg/dL).   Medical History: Past Medical History:  Diagnosis Date   Asthma    CAD (coronary artery disease)    Diabetes (HCC)    Hypertension    Medications:  No prior anticoagulation noted  Assessment: 50 y/o female with Hx of recent MI, presents with chest pain. CT positive for RLL PE without evidence of right heart strain. Pharmacy has been consulted for initiation and management of heparin infusion for PE.  Goal of Therapy:  Heparin level 0.3-0.7 units/ml Monitor platelets by anticoagulation protocol: Yes  7/15 0546 HL 0.21, subtherapeutic - 1350 units/hr 7/15 1305 HL 0.22, subtherapeutic - 1500 units/hr 7/15 2109 HL 0.22, subtherapeutic - 1650 units/hr 7/16 0725 HL <0.10, subtherapeutic -inc to 2100 units/hr 7/16 2250 HL 0.91, supratherapeutic dec to 1950 units/hr 7/17 0540 HL 0.97 supratherapeutic  dec to 1800 units/hr 7/17 1255 HL 0.90 Supratherapeutic       Recheck level w/ periph stick  (unable to get periph stick w/ 4 attempts.) Will decrease drip rate to 1650 units/hr 7/18 0012 HL 0.45, therapeutic 7/18 0505 HL 0.71, supratherapeutic   Plan:  --Will decrease drip rate to 1550 units/hr --Re-check HL 6 hours after rate change. --Daily CBC per protocol while on IV heparin  Otelia Sergeant, PharmD, [facility] 07/08/2021 6:02  AM

## 2021-07-08 NOTE — Progress Notes (Signed)
NAME:  Martha Ramos, MRN:  [id number], DOB:  [birth year], LOS: 4 ADMISSION DATE:  07/04/2021, CONSULTATION DATE:  07/06/2021 REFERRING MD:  Dr. Chipper Herb, CHIEF COMPLAINT:  Chest pain   History of Present Illness:  50 year old female presents to [facility] ED with complaints of chest pain.  Per ED documentation patient reported sudden onset of pressure in the center of her chest 2 hours prior to arrival at ED. she stated the pain has been constant and exacerbated when breathing deeply.  Since the onset of pain she reports palpitations, occasional wheezing. ED course: Patient noted to be tachycardic in the 140s on arrival, initial EKG with unclear rhythm.  IV fluids were initiated with no change in heart rate, IV metoprolol dropped her heart rate into the 120s.  At this point rhythm clearly sinus tachycardia.  CT angio of the chest shows right lower lobe Euler pulmonary embolism along with possible pneumonia.  IV heparin was initiated and empiric antibiotics given.  Labs work significant for leukocytosis, elevated troponin, hyperglycemia, UA positive for UTI.  EKG showing ST with LAD and poor R wave progression. TRH consulted for admission. Hospital course: Cardiology consulted due to ongoing tachycardia and acute on chronic heart failure with current LVEF significantly reduced at less than 20%.  Digoxin and metoprolol IVP administered with minimal effect. Milrinone drip initiated, but stopped shortly after by cardiology due to concerns of marginal blood pressures.  Levophed drip initiated.    PCCM consulted for central line placement as well as assistance with ongoing management including vasopressor support. Pertinent  Medical History  HTN T2DM CAD Asthma Schizophrenia SVT  Significant Hospital Events: Including procedures, antibiotic start and stop dates in addition to other pertinent events   07/05/2021- Admit to SDU with Acute RLL PE and associated acute new onset HFrEF 07/06/2021- Central line  placed, PCCM consulted  Interim History / Subjective:  She is off pressors with appropriate blood pressure. CVP remains elevated between 14-16 cmH2O. She is diuresing well. No complaints this morning.  CT angio chest/abdomen/pelvis: Right lower lobe pulmonary artery embolus, mild cardiomegaly with evidence of right heart dysfunction.  Small bilateral pleural effusions with bibasilar atelectasis.  Small ascites and diffuse subcutaneous edema and anasarca.  Massive versus submassive PE according  Echocardiogram 07/05/2021: LVEF severely reduced at less than 20%, no regional wall motion abnormalities, IVC severely dilated.  RV moderately enlarged, LA moderately dilated, RA moderately dilated.  Moderate MR  Venous ultrasound bilateral lower extremities & bilateral upper extremities 07/05/2021: All negative for DVT  CXR 07/06/21: improved aeration in RLL, possible interstitial edema, possible LLL small effusion (challenging assessment due to cardiomegaly)  Objective   Blood pressure 107/72, pulse (!) 109, temperature 98.4 F (36.9 C), temperature source Oral, resp. rate (!) 21, height 5\' 4"  (1.626 m), weight 103.7 kg, SpO2 96 %. CVP:  [8 mmHg-19 mmHg] 17 mmHg      Intake/Output Summary (Last 24 hours) at 07/08/2021 1041 Last data filed at 07/08/2021 1022 Gross per 24 hour  Intake 745.9 ml  Output 7875 ml  Net -7129.1 ml   Filed Weights   07/05/21 0200 07/06/21 0139 07/08/21 0500  Weight: 103.9 kg 106.9 kg 103.7 kg    Examination: General: Adult female, NAD CV: s1s2, tachycardic no m/g Pulm: b/l inspiratory crackles at the bases, otherwise clear GI: soft, rounded, mildly tender to palpation, bs x 4 GU: external catheter in place with clear urine Skin: no rashes/lesions noted Extremities: warm/dry, pulses + 2 R/P, trace edema noted Resolved Hospital  Problem list   UTI- ruled out  Assessment & Plan:  Acute submassive right lower lobe pulmonary embolism  - continue systemic Heparin drip  per pharmacy protocol - no indication for thrombolysis or thrombectomy - would recommend plans for lifelong anticoagulation - Supplemental O2 to maintain SpO2 > 90%  Acute systolic HFrEF with cardiogenic shock RV failure in the setting of acute pulmonary embolism Elevated troponin secondary to demand ischemia Left-sided heart failure unlikely to be related to acute subsegmental RLL PE given its relatively small size and limited distribution of disease. Etiology of dilated cardiomyopathy as of yet unclear; awaiting ischemic evaluation. - Cardiology following - continue CVP monitoring; diuresis as directed by Cardiology - off vasopressor support this morning - tolerating digoxin well - cautious resumption of beta blockade given relatively low MAP - strict I&O's - continuous cardiac monitoring  Type 2 Diabetes Mellitus - management per primary team  Thank you for the consultation. PCCM will sign off. Please direct any further questions or concerns to the [facility] Provider on AMION.  Best Practice (right click and "Reselect all SmartList Selections" daily)  Diet/type: Regular consistency (see orders) DVT prophylaxis: systemic heparin GI prophylaxis: PPI Lines: Central line and yes and it is still needed Foley:  N/A Code Status:  full code Last date of multidisciplinary goals of care discussion- per primary service  Labs   CBC: Recent Labs  Lab 07/04/21 1942 07/05/21 0546 07/06/21 0725 07/07/21 0540 07/08/21 0505  WBC 20.7* 14.6* 15.3* 13.7* 12.2*  NEUTROABS 17.6*  --  11.8* 9.8*  --   HGB 12.3 11.6* 12.0 11.1* 11.2*  HCT 39.0 36.5 36.9 34.5* 33.7*  MCV 92.2 91.0 91.1 92.7 89.2  PLT 372 216 320 338 347    Basic Metabolic Panel: Recent Labs  Lab 07/05/21 0546 07/06/21 0725 07/07/21 0540 07/07/21 1507 07/08/21 0505  NA 137 136 135 137 138  K 4.1 4.4 4.5 3.8 3.8  CL 106 103 101 104 103  CO2 24 23 24 28 30   GLUCOSE 266* 213* 249* 111* 86  BUN 12 15 14 14 12    CREATININE 0.64 0.68 0.69 0.76 0.65  CALCIUM 8.6* 8.9 8.8* 8.8* 8.7*  MG 1.8 1.8 1.9 1.7 1.7  PHOS  --   --   --   --  3.8   GFR: Estimated Creatinine Clearance: 98.7 mL/min (by C-G formula based on SCr of 0.65 mg/dL). Recent Labs  Lab 07/04/21 1942 07/05/21 0037 07/05/21 0546 07/06/21 0725 07/06/21 0837 07/07/21 0540 07/08/21 0505  PROCALCITON  --   --  0.23  --   --   --   --   WBC 20.7*  --  14.6* 15.3*  --  13.7* 12.2*  LATICACIDVEN 3.2* 2.6*  --   --  1.9  --   --     Liver Function Tests: Recent Labs  Lab 07/04/21 1942 07/05/21 0546  AST 39 20  ALT 49* 40  ALKPHOS 65 58  BILITOT 0.8 0.8  PROT 6.6 6.3*  ALBUMIN 3.3* 3.0*   Recent Labs  Lab 07/04/21 1942  LIPASE 34   No results for input(s): AMMONIA in the last 168 hours.  ABG    Component Value Date/Time   O2SAT 95.5 07/08/2021 0505     Coagulation Profile: Recent Labs  Lab 07/04/21 1942  INR 1.1    Cardiac Enzymes: No results for input(s): CKTOTAL, CKMB, CKMBINDEX, TROPONINI in the last 168 hours.  HbA1C: Hgb A1c MFr Bld  Date/Time Value Ref Range  Status  07/06/2021 07:25 AM 6.2 (H) 4.8 - 5.6 % Final    Comment:    (NOTE) Pre diabetes:          5.7%-6.4%  Diabetes:              >6.4%  Glycemic control for   <7.0% adults with diabetes   07/05/2021 09:37 AM 6.2 (H) 4.8 - 5.6 % Final    Comment:    (NOTE) Pre diabetes:          5.7%-6.4%  Diabetes:              >6.4%  Glycemic control for   <7.0% adults with diabetes     CBG: Recent Labs  Lab 07/07/21 0739 07/07/21 1120 07/07/21 1551 07/07/21 2115 07/08/21 0745  GLUCAP 225* 228* 85 213* 94    Review of Systems: Positives in BOLD  Pertinent findings noted above. All other systems reviewed and negative.  Past Medical History:  She,  has a past medical history of Asthma, CAD (coronary artery disease), Diabetes (HCC), and Hypertension.   Surgical History:  History reviewed. No pertinent surgical history.   Social  History:   reports that she has quit smoking. Her smoking use included cigarettes. She has never used smokeless tobacco. She reports previous alcohol use. She reports previous drug use.   Family History:  Her family history includes Alcohol abuse in her father; Heart failure in her mother.   Allergies No Known Allergies   Home Medications  Prior to Admission medications   Medication Sig Start Date End Date Taking? Authorizing Provider  atorvastatin (LIPITOR) 20 MG tablet Take 1 tablet by mouth daily.   Yes [provider]  DULoxetine (CYMBALTA) 60 MG capsule Take 60 mg by mouth daily. 05/21/21  Yes [provider]  glipiZIDE (GLUCOTROL) 5 MG tablet Take 5 mg by mouth 2 (two) times daily. 04/17/21  Yes [provider]  haloperidol (HALDOL) 10 MG tablet Take 10 mg by mouth at bedtime. 05/15/21  Yes [provider]  K-PHOS 500 MG tablet Take 500 mg by mouth 2 (two) times daily. 06/25/21  Yes [provider]  metFORMIN (GLUCOPHAGE) 500 MG tablet Take 500 mg by mouth 2 (two) times daily. 04/17/21  Yes [provider]  omeprazole (PRILOSEC) 20 MG capsule Take 20 mg by mouth 2 (two) times daily. 05/03/21  Yes [provider]  potassium chloride SA (KLOR-CON) 20 MEQ tablet Take 20 mEq by mouth daily. 06/20/21  Yes [provider]  VENTOLIN HFA 108 (90 Base) MCG/ACT inhaler Inhale 1-2 puffs into the lungs every 4 (four) hours as needed. 04/17/21  Yes [provider]     Marcelo Baldy, MD 07/08/21 10:42 AM

## 2021-07-08 NOTE — Consult Note (Signed)
ANTICOAGULATION CONSULT NOTE  Pharmacy Consult for Heparin infusion Indication: pulmonary embolus  Patient Measurements: Heparin Dosing Weight: 79 kg   Labs: Recent Labs    07/05/21 0546 07/05/21 1305 07/06/21 0725 07/06/21 2250 07/07/21 0540 07/07/21 1255 07/07/21 1507 07/08/21 0012  HGB 11.6*  --  12.0  --  11.1*  --   --   --   HCT 36.5  --  36.9  --  34.5*  --   --   --   PLT 216  --  320  --  338  --   --   --   HEPARINUNFRC 0.21*   < > <0.10*   < > 0.97* 0.90*  --  0.45  CREATININE 0.64  --  0.68  --  0.69  --  0.76  --    < > = values in this interval not displayed.    Estimated Creatinine Clearance: 100.4 mL/min (by C-G formula based on SCr of 0.76 mg/dL).   Medical History: Past Medical History:  Diagnosis Date   Asthma    CAD (coronary artery disease)    Diabetes (HCC)    Hypertension    Medications:  No prior anticoagulation noted  Assessment: 50 y/o female with Hx of recent MI, presents with chest pain. CT positive for RLL PE without evidence of right heart strain. Pharmacy has been consulted for initiation and management of heparin infusion for PE.  Goal of Therapy:  Heparin level 0.3-0.7 units/ml Monitor platelets by anticoagulation protocol: Yes  7/15 0546 HL 0.21, subtherapeutic - 1350 units/hr 7/15 1305 HL 0.22, subtherapeutic - 1500 units/hr 7/15 2109 HL 0.22, subtherapeutic - 1650 units/hr 7/16 0725 HL <0.10, subtherapeutic -inc to 2100 units/hr 7/16 2250 HL 0.91, supratherapeutic dec to 1950 units/hr 7/17 0540 HL 0.97 supratherapeutic  dec to 1800 units/hr 7/17 1255 HL 0.90 Supratherapeutic       Recheck level w/ periph stick  (unable to get periph stick w/ 4 attempts.) Will decrease drip rate to 1650 units/hr 7/18 0012 HL 0.45, therapeutic   Plan:  --Will continue drip rate at 1650 units/hr --Re-check HL 6 hours to confirm --Daily CBC per protocol while on IV heparin  Otelia Sergeant, PharmD, [facility] 07/08/2021 1:06 AM

## 2021-07-08 NOTE — Progress Notes (Signed)
SUBJECTIVE: Patient is a 50 y.o. African American female with medical history significant for asthma, type 2 diabetes mellitus, hypertension and coronary artery disease. Patient resting comfortably in bed at this time. Denies chest pain.   Vitals:   07/08/21 0220 07/08/21 0450 07/08/21 0500 07/08/21 0510  BP:    92/64  Pulse: (!) 131 92  91  Resp:  (!) 26  (!) 24  Temp:      TempSrc:      SpO2:      Weight:   103.7 kg   Height:        Intake/Output Summary (Last 24 hours) at 07/08/2021 8101 Last data filed at 07/08/2021 7510 Gross per 24 hour  Intake 731.05 ml  Output 7740 ml  Net -7008.95 ml    LABS: Basic Metabolic Panel: Recent Labs    07/07/21 1507 07/08/21 0505  NA 137 138  K 3.8 3.8  CL 104 103  CO2 28 30  GLUCOSE 111* 86  BUN 14 12  CREATININE 0.76 0.65  CALCIUM 8.8* 8.7*  MG 1.7 1.7  PHOS  --  3.8   Liver Function Tests: No results for input(s): AST, ALT, ALKPHOS, BILITOT, PROT, ALBUMIN in the last 72 hours. No results for input(s): LIPASE, AMYLASE in the last 72 hours. CBC: Recent Labs    07/06/21 0725 07/07/21 0540 07/08/21 0505  WBC 15.3* 13.7* 12.2*  NEUTROABS 11.8* 9.8*  --   HGB 12.0 11.1* 11.2*  HCT 36.9 34.5* 33.7*  MCV 91.1 92.7 89.2  PLT 320 338 347   Cardiac Enzymes: No results for input(s): CKTOTAL, CKMB, CKMBINDEX, TROPONINI in the last 72 hours. BNP: Invalid input(s): POCBNP D-Dimer: No results for input(s): DDIMER in the last 72 hours. Hemoglobin A1C: Recent Labs    07/06/21 0725  HGBA1C 6.2*   Fasting Lipid Panel: No results for input(s): CHOL, HDL, LDLCALC, TRIG, CHOLHDL, LDLDIRECT in the last 72 hours. Thyroid Function Tests: No results for input(s): TSH, T4TOTAL, T3FREE, THYROIDAB in the last 72 hours.  Invalid input(s): FREET3 Anemia Panel: No results for input(s): VITAMINB12, FOLATE, FERRITIN, TIBC, IRON, RETICCTPCT in the last 72 hours.   PHYSICAL EXAM General: Well developed, well nourished, in no acute  distress HEENT:  Normocephalic and atramatic Neck:  No JVD.  Lungs: Clear bilaterally to auscultation and percussion. Heart: HRRR . Normal S1 and S2 without gallops or murmurs.  Abdomen: Bowel sounds are positive, abdomen soft and non-tender  Msk:  Back normal, normal gait. Normal strength and tone for age. Extremities: No clubbing, cyanosis or edema.   Neuro: Alert and oriented X 3. Psych:  Good affect, responds appropriately  TELEMETRY: NSR, HR 89  ASSESSMENT AND PLAN: Patient being seen by Dr. Mariah Milling. We will sign off at this time.  Active Problems:   Acute pulmonary embolism (HCC)   Uncontrolled type 2 diabetes mellitus with hyperglycemia (HCC)   Acute on chronic combined systolic (congestive) and diastolic (congestive) heart failure (HCC)   Cardiogenic shock (HCC)   RVF (right ventricular failure) (HCC)    Tayron Hunnell, FNP-C 07/08/2021 8:22 AM

## 2021-07-08 NOTE — Progress Notes (Signed)
Progress Note  Patient Name: Martha Ramos Date of Encounter: 07/08/2021  [facility] Cardiologist: [facility]  Subjective   Alert, slow with words, no family at the bedside Nurse reports she received 10 Haldol last night No complaints, no shortness of breath or chest pain Telemetry reviewed, short run nonsustained VT this a.m., asymptomatic Echocardiogram reviewed severely reduced LV function, RV on four-chamber axial images moderately depressed RV function, appears moderately dilated  Inpatient Medications    Scheduled Meds:  atorvastatin  20 mg Oral Daily   Chlorhexidine Gluconate Cloth  6 each Topical Q0600   digoxin  0.125 mg Intravenous Daily   DULoxetine  60 mg Oral Daily   furosemide  80 mg Intravenous Q12H   haloperidol  10 mg Oral QHS   insulin aspart  0-15 Units Subcutaneous TID AC & HS   insulin glargine  20 Units Subcutaneous QHS   mouth rinse  15 mL Mouth Rinse BID   metoprolol tartrate  12.5 mg Oral BID   pantoprazole  40 mg Oral Daily   potassium chloride SA  20 mEq Oral Daily   potassium phosphate (monobasic)  500 mg Oral BID   Continuous Infusions:  heparin 1,550 Units/hr (07/08/21 1022)   milrinone Stopped (07/06/21 1759)   phenylephrine (NEO-SYNEPHRINE) Adult infusion     PRN Meds: acetaminophen **OR** acetaminophen, albuterol, magnesium hydroxide, morphine injection, ondansetron **OR** ondansetron (ZOFRAN) IV, traZODone   Vital Signs    Vitals:   07/08/21 0800 07/08/21 0900 07/08/21 0930 07/08/21 1000  BP: (!) 85/62 102/68 95/77 107/72  Pulse: 92 (!) 111 (!) 106 (!) 109  Resp: (!) 22 19 (!) 21 (!) 21  Temp: 98.4 F (36.9 C)     TempSrc: Oral     SpO2: 97% 100% 98% 96%  Weight:      Height:        Intake/Output Summary (Last 24 hours) at 07/08/2021 1036 Last data filed at 07/08/2021 1022 Gross per 24 hour  Intake 745.9 ml  Output 7875 ml  Net -7129.1 ml   Last 3 Weights 07/08/2021 07/06/2021 07/05/2021  Weight (lbs) 228 lb 9.9 oz 235  lb 10.8 oz 229 lb 0.9 oz  Weight (kg) 103.7 kg 106.9 kg 103.9 kg      Telemetry    Normal sinus rhythm- Personally Reviewed  ECG    - Personally Reviewed  Physical Exam   GEN: No acute distress.   Neck: No JVD Cardiac: RRR, no murmurs, rubs, or gallops.  Respiratory: Clear to auscultation bilaterally. GI: Soft, nontender, non-distended  MS: No edema; No deformity. Neuro: Grossly nonfocal, full exam not performed Psych: Grossly nonfocal, full exam not performed  Labs    High Sensitivity Troponin:   Recent Labs  Lab 07/04/21 1942 07/04/21 2138  TROPONINIHS 101* 92*      Chemistry Recent Labs  Lab 07/04/21 1942 07/05/21 0546 07/06/21 0725 07/07/21 0540 07/07/21 1507 07/08/21 0505  NA 136 137   < > 135 137 138  K 3.9 4.1   < > 4.5 3.8 3.8  CL 103 106   < > 101 104 103  CO2 22 24   < > 24 28 30   GLUCOSE 339* 266*   < > 249* 111* 86  BUN 13 12   < > 14 14 12   CREATININE 0.87 0.64   < > 0.69 0.76 0.65  CALCIUM 8.9 8.6*   < > 8.8* 8.8* 8.7*  PROT 6.6 6.3*  --   --   --   --  ALBUMIN 3.3* 3.0*  --   --   --   --   AST 39 20  --   --   --   --   ALT 49* 40  --   --   --   --   ALKPHOS 65 58  --   --   --   --   BILITOT 0.8 0.8  --   --   --   --   GFRNONAA >60 >60   < > >60 >60 >60  ANIONGAP 11 7   < > 10 5 5    < > = values in this interval not displayed.     Hematology Recent Labs  Lab 07/06/21 0725 07/07/21 0540 07/08/21 0505  WBC 15.3* 13.7* 12.2*  RBC 4.05 3.72* 3.78*  HGB 12.0 11.1* 11.2*  HCT 36.9 34.5* 33.7*  MCV 91.1 92.7 89.2  MCH 29.6 29.8 29.6  MCHC 32.5 32.2 33.2  RDW 14.9 14.6 14.5  PLT 320 338 347    BNP Recent Labs  Lab 07/04/21 1942 07/05/21 0546 07/06/21 0837  BNP 1,128.3* 1,356.8* 1,430.3*     DDimer No results for input(s): DDIMER in the last 168 hours.   Radiology    DG Chest Port 1 View  Result Date: 07/06/2021 CLINICAL DATA:  Check line placement EXAM: PORTABLE CHEST 1 VIEW COMPARISON:  July 04, 2021 FINDINGS:  The new right central line terminates just in the right side of the atrium, approximately 2 cm above the caval atrial junction. No pneumothorax. No other changes. IMPRESSION: The new central line terminates 2 cm distal to the caval atrial junction. Recommend withdrawing 2 cm. No pneumothorax. No other change. These results will be called to the ordering clinician or representative by the Radiologist Assistant, and communication documented in the PACS or July 06, 2021. Electronically Signed   By: [employer] M.D   On: 07/06/2021 16:00   07/08/2021 EKG SITE RITE  Result Date: 07/06/2021 If Site Rite image not attached, placement could not be confirmed due to current cardiac rhythm.   Cardiac Studies   Echo  1. Severely dilated systolic cardiomyopathy.   2. Left ventricular ejection fraction, by estimation, is <20%. The left  ventricle has severely decreased function. The left ventricle has no  regional wall motion abnormalities. The left ventricular internal cavity  size was severely dilated. Left  ventricular diastolic parameters were normal.   3. Right ventricular systolic function is severely reduced. The right  ventricular size is moderately enlarged.   4. Left atrial size was moderately dilated.   5. Right atrial size was moderately dilated.   6. The mitral valve is grossly normal. Moderate mitral valve  regurgitation.   7. Tricuspid valve regurgitation is moderate.   8. The aortic valve is grossly normal. Aortic valve regurgitation is not  visualized.   Patient Profile      Martha Ramos is a 50 y.o. 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   Assessment & Plan   # Acute pulmonary embolism:  # RV failure:  -Echocardiogram reviewed personally by myself, Medical management recommended, heparin Currently off milrinone Would continue  IV diuresis twice daily  # Acute systolic and diastolic heart failure: # Cardiogenic shock: new onset LVEF less than 20%.   Prior echo 03/2020 revealed normal systolic function.   Etiology unclear, Family was at the bedside but left  before discussion concerning consent for right left heart cath could be initiated We will try to reach out and call Low risk of underlying ischemia given no coronary calcification on CT scan but given acute drop in ejection fraction, would be worthwhile to pursue -We will continue digoxin, IV Lasix, Little room on blood pressure to add additional agents -For any worsening renal dysfunction or drop in urine output may need to restart milrinone  Bipolar disorder Nurses report receiving large doses Haldol Poor mentation this morning, unable to consent to any procedures   Case discussed with nursing  Total encounter time more than 35 minutes  Greater than 50% was spent in counseling and coordination of care with the patient    For questions or updates, please contact [facility] Please consult [email or web address] for contact info under        Signed, Julien Nordmann, MD  07/08/2021, 10:36 AM

## 2021-07-08 NOTE — Progress Notes (Signed)
PROGRESS NOTE    Martha Ramos  YQM:[id number] DOB: [birth year] DOA: 07/04/2021 PCP: Darnelle Spangle, MD   Chief complaint.  Chest pain. Brief Narrative:  Martha Ramos is a 50 y.o. African American female with medical history significant for asthma, type 2 diabetes mellitus, hypertension and coronary artery disease, who presented to the emergency room with acute onset of midsternal chest pain with associated dyspnea as well as cough occasionally productive of clear sputum and occasional wheezing. Upon arriving in the hospital, patient had a severe tachycardia, with heart rate in the 120s, leukocytosis of 20.7, lactic acidosis of 3.2.  CT angiogram of the chest showed a right lower lobe pulmonary emboli.  Mild cardiomegaly with evidence of right heart dysfunction.  Small bilateral pleural effusion with basilar atelectasis.  BNP elevated at 1128.3.  Troponin 101.  Patient was placed on IV Lasix.  She is also treated with Rocephin and Zithromax. She does not seem to have infection, antibiotic discontinued.  Patient is seen by cardiology, echocardiogram showed ejection fraction less than 20%, she appears in cardiogenic shock.  She is also placed on heparin drip for PE.   Assessment & Plan:   Active Problems:   Acute pulmonary embolism (HCC)   Uncontrolled type 2 diabetes mellitus with hyperglycemia (HCC)   Acute on chronic combined systolic (congestive) and diastolic (congestive) heart failure (HCC)   Cardiogenic shock (HCC)   RVF (right ventricular failure) (HCC)    #1.  Acute right lower lobe pulmonary emboli. Elevated troponin secondary to PE. Patient is scheduled for heart cath tomorrow, will continue heparin drip for today.  May change to Eliquis after heart cath.  2.  Acute on chronic systolic congestive heart failure. Right-sided congestive heart failure. Sinus tachycardia. Cardiogenic shock. Nonsustained ventricular tachycardia. Patient currently on IV Lasix, condition  gradually getting better.  Tachycardia is better.  Patient is scheduled to have a heart cath tomorrow. Appreciate cardiology consult.  Patient also placed on amiodarone for nonsustained ventricular tachycardia.  3.  Uncontrolled type 2 diabetes with hyperglycemia Continue Lantus and sliding scale insulin.  4.  Lactic acidosis. Appear to be due to metformin, no evidence of bacterial infection.     DVT prophylaxis: Heparin Code Status: full Family Communication:  Disposition Plan:    Status is: Inpatient  Remains inpatient appropriate because:Ongoing diagnostic testing needed not appropriate for outpatient work up, IV treatments appropriate due to intensity of illness or inability to take PO, and Inpatient level of care appropriate due to severity of illness  Dispo: The patient is from: Home              Anticipated d/c is to: Home              Patient currently is not medically stable to d/c.   Difficult to place patient No        I/O last 3 completed shifts: In: 1278.6 [P.O.:580; I.V.:698.6] Out: 7925 [Urine:7925] Total I/O In: 87.3 [I.V.:87.3] Out: 2200 [Urine:2200]     Consultants:  Card  Procedures: None  Antimicrobials: None  Subjective: Patient feels much better.  No chest pain. No short of breath, no cough. No fever or chills. No abdominal pain or nausea vomiting. No dysuria or hematuria.  Objective: Vitals:   07/08/21 1100 07/08/21 1130 07/08/21 1200 07/08/21 1400  BP: 93/66 101/75 109/62 (!) 93/55  Pulse: (!) 109 (!) 109 (!) 113 (!) 115  Resp: (!) 23 (!) 23 17 17   Temp:   98.6 F (37 C) 98 F (  36.7 C)  TempSrc:   Oral Oral  SpO2: 95% 95% 99% 99%  Weight:      Height:        Intake/Output Summary (Last 24 hours) at 07/08/2021 1441 Last data filed at 07/08/2021 1208 Gross per 24 hour  Intake 461.38 ml  Output 7325 ml  Net -6863.62 ml   Filed Weights   07/05/21 0200 07/06/21 0139 07/08/21 0500  Weight: 103.9 kg 106.9 kg 103.7 kg     Examination:  General exam: Appears calm and comfortable  Respiratory system: Clear to auscultation. Respiratory effort normal. Cardiovascular system: S1 & S2 heard, RRR. No JVD, murmurs, rubs, gallops or clicks. No pedal edema. Gastrointestinal system: Abdomen is nondistended, soft and nontender. No organomegaly or masses felt. Normal bowel sounds heard. Central nervous system: Alert and oriented. No focal neurological deficits. Extremities: Symmetric 5 x 5 power. Skin: No rashes, lesions or ulcers Psychiatry: Judgement and insight appear normal. Mood & affect appropriate.     Data Reviewed: I have personally reviewed following labs and imaging studies  CBC: Recent Labs  Lab 07/04/21 1942 07/05/21 0546 07/06/21 0725 07/07/21 0540 07/08/21 0505  WBC 20.7* 14.6* 15.3* 13.7* 12.2*  NEUTROABS 17.6*  --  11.8* 9.8*  --   HGB 12.3 11.6* 12.0 11.1* 11.2*  HCT 39.0 36.5 36.9 34.5* 33.7*  MCV 92.2 91.0 91.1 92.7 89.2  PLT 372 216 320 338 347   Basic Metabolic Panel: Recent Labs  Lab 07/05/21 0546 07/06/21 0725 07/07/21 0540 07/07/21 1507 07/08/21 0505  NA 137 136 135 137 138  K 4.1 4.4 4.5 3.8 3.8  CL 106 103 101 104 103  CO2 24 23 24 28 30   GLUCOSE 266* 213* 249* 111* 86  BUN 12 15 14 14 12   CREATININE 0.64 0.68 0.69 0.76 0.65  CALCIUM 8.6* 8.9 8.8* 8.8* 8.7*  MG 1.8 1.8 1.9 1.7 1.7  PHOS  --   --   --   --  3.8   GFR: Estimated Creatinine Clearance: 98.7 mL/min (by C-G formula based on SCr of 0.65 mg/dL). Liver Function Tests: Recent Labs  Lab 07/04/21 1942 07/05/21 0546  AST 39 20  ALT 49* 40  ALKPHOS 65 58  BILITOT 0.8 0.8  PROT 6.6 6.3*  ALBUMIN 3.3* 3.0*   Recent Labs  Lab 07/04/21 1942  LIPASE 34   No results for input(s): AMMONIA in the last 168 hours. Coagulation Profile: Recent Labs  Lab 07/04/21 1942  INR 1.1   Cardiac Enzymes: No results for input(s): CKTOTAL, CKMB, CKMBINDEX, TROPONINI in the last 168 hours. BNP (last 3  results) No results for input(s): PROBNP in the last 8760 hours. HbA1C: Recent Labs    07/06/21 0725  HGBA1C 6.2*   CBG: Recent Labs  Lab 07/07/21 1120 07/07/21 1551 07/07/21 2115 07/08/21 0745 07/08/21 1113  GLUCAP 228* 85 213* 94 171*   Lipid Profile: No results for input(s): CHOL, HDL, LDLCALC, TRIG, CHOLHDL, LDLDIRECT in the last 72 hours. Thyroid Function Tests: No results for input(s): TSH, T4TOTAL, FREET4, T3FREE, THYROIDAB in the last 72 hours. Anemia Panel: No results for input(s): VITAMINB12, FOLATE, FERRITIN, TIBC, IRON, RETICCTPCT in the last 72 hours. Sepsis Labs: Recent Labs  Lab 07/04/21 1942 07/05/21 0037 07/05/21 0546 07/06/21 0837  PROCALCITON  --   --  0.23  --   LATICACIDVEN 3.2* 2.6*  --  1.9    Recent Results (from the past 240 hour(s))  Resp Panel by RT-PCR (Flu A&B, Covid) Nasopharyngeal Swab  Status: None   Collection Time: 07/04/21  9:38 PM   Specimen: Nasopharyngeal Swab; Nasopharyngeal(NP) swabs in vial transport medium  Result Value Ref Range Status   SARS Coronavirus 2 by RT PCR NEGATIVE NEGATIVE Final    Comment: (NOTE) SARS-CoV-2 target nucleic acids are NOT DETECTED.  The SARS-CoV-2 RNA is generally detectable in upper respiratory specimens during the acute phase of infection. The lowest concentration of SARS-CoV-2 viral copies this assay can detect is 138 copies/mL. A negative result does not preclude SARS-Cov-2 infection and should not be used as the sole basis for treatment or other patient management decisions. A negative result may occur with  improper specimen collection/handling, submission of specimen other than nasopharyngeal swab, presence of viral mutation(s) within the areas targeted by this assay, and inadequate number of viral copies(<138 copies/mL). A negative result must be combined with clinical observations, patient history, and epidemiological information. The expected result is Negative.  Fact Sheet for  Patients:  BloggerCourse.com  Fact Sheet for Healthcare Providers:  SeriousBroker.it  This test is no t yet approved or cleared by the [place] FDA and  has been authorized for detection and/or diagnosis of SARS-CoV-2 by FDA under an Emergency Use Authorization (EUA). This EUA will remain  in effect (meaning this test can be used) for the duration of the COVID-19 declaration under Section 564(b)(1) of the Act, 21 U.S.C.section 360bbb-3(b)(1), unless the authorization is terminated  or revoked sooner.       Influenza A by PCR NEGATIVE NEGATIVE Final   Influenza B by PCR NEGATIVE NEGATIVE Final    Comment: (NOTE) The Xpert Xpress SARS-CoV-2/FLU/RSV plus assay is intended as an aid in the diagnosis of influenza from Nasopharyngeal swab specimens and should not be used as a sole basis for treatment. Nasal washings and aspirates are unacceptable for Xpert Xpress SARS-CoV-2/FLU/RSV testing.  Fact Sheet for Patients: BloggerCourse.com  Fact Sheet for Healthcare Providers: SeriousBroker.it  This test is not yet approved or cleared by the [place] FDA and has been authorized for detection and/or diagnosis of SARS-CoV-2 by FDA under an Emergency Use Authorization (EUA). This EUA will remain in effect (meaning this test can be used) for the duration of the COVID-19 declaration under Section 564(b)(1) of the Act, 21 U.S.C. section 360bbb-3(b)(1), unless the authorization is terminated or revoked.  Performed at [facility], [address] [place], [place] [address]   Culture, blood (routine x 2)     Status: None (Preliminary result)   Collection Time: 07/04/21 10:01 PM   Specimen: BLOOD  Result Value Ref Range Status   Specimen Description BLOOD BLOOD RIGHT HAND  Final   Special Requests   Final    BOTTLES DRAWN AEROBIC AND ANAEROBIC Blood Culture adequate volume    Culture   Final    NO GROWTH 4 DAYS Performed at [facility], [address] [place], [place] [address]    Report Status PENDING  Incomplete  Culture, blood (routine x 2)     Status: None (Preliminary result)   Collection Time: 07/04/21 10:01 PM   Specimen: BLOOD  Result Value Ref Range Status   Specimen Description BLOOD RIGHT ANTECUBITAL  Final   Special Requests   Final    BOTTLES DRAWN AEROBIC AND ANAEROBIC Blood Culture adequate volume   Culture   Final    NO GROWTH 4 DAYS Performed at [facility], [address] [place], [place] [address]    Report Status PENDING  Incomplete  Urine Culture  Status: None   Collection Time: 07/04/21 11:30 PM   Specimen: Urine, Catheterized  Result Value Ref Range Status   Specimen Description   Final    URINE, CATHETERIZED Performed at [facility], [address] [place], [place] [address]    Special Requests   Final    NONE Performed at [facility], [address], [place], [place] [address]    Culture   Final    NO GROWTH Performed at [facility], [address], [place], [place] [address]    Report Status 07/06/2021 FINAL  Final  MRSA Next Gen by PCR, Nasal     Status: None   Collection Time: 07/05/21  2:15 AM   Specimen: Nasal Mucosa; Nasal Swab  Result Value Ref Range Status   MRSA by PCR Next Gen NOT DETECTED NOT DETECTED Final    Comment: (NOTE) The GeneXpert MRSA Assay (FDA approved for NASAL specimens only), is one component of a comprehensive MRSA colonization surveillance program. It is not intended to diagnose MRSA infection nor to guide or monitor treatment for MRSA infections. Test performance is not FDA approved in patients less than 30 years old. Performed at [facility], [address], [place], [place] [address]          Radiology Studies: DG Chest Port 1 View  Result Date: 07/06/2021 CLINICAL DATA:  Check line placement EXAM: PORTABLE  CHEST 1 VIEW COMPARISON:  July 04, 2021 FINDINGS: The new right central line terminates just in the right side of the atrium, approximately 2 cm above the caval atrial junction. No pneumothorax. No other changes. IMPRESSION: The new central line terminates 2 cm distal to the caval atrial junction. Recommend withdrawing 2 cm. No pneumothorax. No other change. These results will be called to the ordering clinician or representative by the Radiologist Assistant, and communication documented in the PACS or [employer]. Electronically Signed   By: Gerome Sam III M.D   On: 07/06/2021 16:00        Scheduled Meds:  atorvastatin  20 mg Oral Daily   Chlorhexidine Gluconate Cloth  6 each Topical Q0600   digoxin  0.125 mg Intravenous Daily   DULoxetine  60 mg Oral Daily   furosemide  80 mg Intravenous Q12H   haloperidol  10 mg Oral QHS   insulin aspart  0-15 Units Subcutaneous TID AC & HS   insulin glargine  20 Units Subcutaneous QHS   mouth rinse  15 mL Mouth Rinse BID   metoprolol tartrate  12.5 mg Oral BID   pantoprazole  40 mg Oral Daily   potassium chloride SA  20 mEq Oral Daily   potassium phosphate (monobasic)  500 mg Oral BID   Continuous Infusions:  heparin 1,550 Units/hr (07/08/21 1208)   milrinone Stopped (07/06/21 1759)   phenylephrine (NEO-SYNEPHRINE) Adult infusion       LOS: 4 days    Time spent: 28 minutes    Marrion Coy, MD [facility]   To contact the attending provider between 7A-7P or the covering provider during after hours 7P-7A, please log into the web site [email or web address] and access using universal Central password for that web site. If you do not have the password, please call the hospital operator.  07/08/2021, 2:41 PM

## 2021-07-08 NOTE — Progress Notes (Signed)
   07/08/21 1400  Assess: MEWS Score  Temp 98 F (36.7 C)  BP (!) 93/55  Pulse Rate (!) 115  ECG Heart Rate (!) 115  Resp 17  Level of Consciousness Alert  SpO2 99 %  O2 Device Room Air  Assess: MEWS Score  MEWS Temp 0  MEWS Systolic 1  MEWS Pulse 2  MEWS RR 0  MEWS LOC 0  MEWS Score 3  MEWS Score Color Yellow  Assess: if the MEWS score is Yellow or Red  Were vital signs taken at a resting state? Yes  Focused Assessment No change from prior assessment  MEWS guidelines implemented *See Row Information* Yes  Treat  MEWS Interventions Escalated (See documentation below)  Pain Scale 0-10  Pain Score 0  Take Vital Signs  Increase Vital Sign Frequency  Yellow: Q 2hr X 2 then Q 4hr X 2, if remains yellow, continue Q 4hrs  Escalate  MEWS: Escalate Yellow: discuss with charge nurse/RN and consider discussing with provider and RRT  Notify: Charge Nurse/RN  Name of Charge Nurse/RN Notified Judeth Cornfield  Date Charge Nurse/RN Notified 07/08/21  Time Charge Nurse/RN Notified 1410  Notify: Provider  Provider Name/Title Zhang  Date Provider Notified 07/08/21  Time Provider Notified 1410  Notification Type Page  Notification Reason Other (Comment) (Pt HR 110s, BP 93/55, MAP 65, 24 beat run of V-tach)  Provider response No new orders;Other (Comment) (Gollan made aware)  Date of Provider Response 07/08/21  Time of Provider Response 1413  Document  Patient Outcome Not stable and remains on department  Assess: SIRS CRITERIA  SIRS Temperature  0  SIRS Pulse 1  SIRS Respirations  0  SIRS WBC 1  SIRS Score Sum  2

## 2021-07-08 NOTE — Plan of Care (Signed)
  Problem: Education: Goal: Knowledge of General Education information will improve Description: Including pain rating scale, medication(s)/side effects and non-pharmacologic comfort measures Outcome: Not Progressing Note: Patient was sleep at the beginning of the shift, refused education about cardiac catherization. Patient has agreed to have procedure.

## 2021-07-08 NOTE — Progress Notes (Signed)
Report given to [facility] and patient transferred to room 237 on telemetry monitor and room air.  Patient is alert and oriented x 4.  Patient's cellphone at bedside.

## 2021-07-09 ENCOUNTER — Encounter
Admission: EM | Disposition: A | Discharge status: Home Health | Payer: Self-pay | Source: Home / Self Care | Attending: Internal Medicine

## 2021-07-09 DIAGNOSIS — I5043 Acute on chronic combined systolic (congestive) and diastolic (congestive) heart failure: Secondary | ICD-10-CM | POA: Diagnosis not present

## 2021-07-09 DIAGNOSIS — I2609 Other pulmonary embolism with acute cor pulmonale: Secondary | ICD-10-CM | POA: Diagnosis not present

## 2021-07-09 DIAGNOSIS — E1165 Type 2 diabetes mellitus with hyperglycemia: Secondary | ICD-10-CM | POA: Diagnosis not present

## 2021-07-09 DIAGNOSIS — I429 Cardiomyopathy, unspecified: Secondary | ICD-10-CM | POA: Diagnosis not present

## 2021-07-09 DIAGNOSIS — I2699 Other pulmonary embolism without acute cor pulmonale: Secondary | ICD-10-CM | POA: Diagnosis not present

## 2021-07-09 DIAGNOSIS — R570 Cardiogenic shock: Secondary | ICD-10-CM | POA: Diagnosis not present

## 2021-07-09 HISTORY — PX: LEFT HEART CATH AND CORONARY ANGIOGRAPHY: CATH118249

## 2021-07-09 LAB — GLUCOSE, CAPILLARY
Glucose-Capillary: 143 mg/dL — ABNORMAL HIGH (ref 70–99)
Glucose-Capillary: 76 mg/dL (ref 70–99)
Glucose-Capillary: 128 mg/dL — ABNORMAL HIGH (ref 70–99)
Glucose-Capillary: 56 mg/dL — ABNORMAL LOW (ref 70–99)
Glucose-Capillary: 102 mg/dL — ABNORMAL HIGH (ref 70–99)
Glucose-Capillary: 185 mg/dL — ABNORMAL HIGH (ref 70–99)
Glucose-Capillary: 290 mg/dL — ABNORMAL HIGH (ref 70–99)

## 2021-07-09 LAB — CBC
HCT: 31.9 % — ABNORMAL LOW (ref 36.0–46.0)
Hemoglobin: 10.5 g/dL — ABNORMAL LOW (ref 12.0–15.0)
MCH: 29.6 pg (ref 26.0–34.0)
MCHC: 32.9 g/dL (ref 30.0–36.0)
MCV: 89.9 fL (ref 80.0–100.0)
Platelets: 350 10*3/uL (ref 150–400)
RBC: 3.55 MIL/uL — ABNORMAL LOW (ref 3.87–5.11)
RDW: 14.2 % (ref 11.5–15.5)
WBC: 10.1 10*3/uL (ref 4.0–10.5)
nRBC: 0 % (ref 0.0–0.2)

## 2021-07-09 LAB — CULTURE, BLOOD (ROUTINE X 2)
Culture: NO GROWTH
Culture: NO GROWTH
Special Requests: ADEQUATE
Special Requests: ADEQUATE

## 2021-07-09 LAB — BASIC METABOLIC PANEL
Anion gap: 9 (ref 5–15)
BUN: 13 mg/dL (ref 6–20)
CO2: 30 mmol/L (ref 22–32)
Calcium: 8.7 mg/dL — ABNORMAL LOW (ref 8.9–10.3)
Chloride: 99 mmol/L (ref 98–111)
Creatinine, Ser: 0.75 mg/dL (ref 0.44–1.00)
GFR, Estimated: >60 mL/min (ref >60)
Glucose, Bld: 102 mg/dL — ABNORMAL HIGH (ref 70–99)
Potassium: 3.7 mmol/L (ref 3.5–5.1)
Sodium: 138 mmol/L (ref 135–145)

## 2021-07-09 LAB — MAGNESIUM: Magnesium: 1.8 mg/dL (ref 1.7–2.4)

## 2021-07-09 LAB — PHOSPHORUS: Phosphorus: 4 mg/dL (ref 2.5–4.6)

## 2021-07-09 LAB — DIGOXIN LEVEL: Digoxin Level: 1 ng/mL (ref 0.8–2.0)

## 2021-07-09 LAB — HEPARIN LEVEL (UNFRACTIONATED): Heparin Unfractionated: 0.48 IU/mL (ref 0.30–0.70)

## 2021-07-09 SURGERY — LEFT HEART CATH AND CORONARY ANGIOGRAPHY
Anesthesia: Moderate Sedation

## 2021-07-09 MED ORDER — HEPARIN SODIUM (PORCINE) 1000 UNIT/ML IJ SOLN
INTRAMUSCULAR | Status: AC
  Filled 2021-07-09: qty 1

## 2021-07-09 MED ORDER — SODIUM CHLORIDE 0.9 % WEIGHT BASED INFUSION
1.0000 mL/kg/h | INTRAVENOUS | Status: AC
  Administered 2021-07-09: 1 mL/kg/h via INTRAVENOUS

## 2021-07-09 MED ORDER — SODIUM CHLORIDE 0.9 % IV SOLN: Freq: Once | INTRAVENOUS | Status: AC

## 2021-07-09 MED ORDER — HYDRALAZINE HCL 20 MG/ML IJ SOLN: 10.0000 mg | INTRAMUSCULAR | Status: AC | PRN

## 2021-07-09 MED ORDER — SODIUM CHLORIDE 0.9 % IV BOLUS
250.0000 mL | Freq: Once | INTRAVENOUS | Status: AC
  Administered 2021-07-09: 250 mL via INTRAVENOUS

## 2021-07-09 MED ORDER — LIDOCAINE HCL (PF) 1 % IJ SOLN
INTRAMUSCULAR | Status: DC | PRN
  Administered 2021-07-09: 3 mL

## 2021-07-09 MED ORDER — HEPARIN (PORCINE) 25000 UT/250ML-% IV SOLN
2100.0000 [IU]/h | INTRAVENOUS | Status: AC
  Administered 2021-07-09: 1550 [IU]/h via INTRAVENOUS
  Administered 2021-07-10: 1800 [IU]/h via INTRAVENOUS
  Administered 2021-07-10: 2100 [IU]/h via INTRAVENOUS
  Filled 2021-07-09 (×3): qty 250

## 2021-07-09 MED ORDER — LIDOCAINE HCL (PF) 1 % IJ SOLN
INTRAMUSCULAR | Status: AC
  Filled 2021-07-09: qty 30

## 2021-07-09 MED ORDER — HEPARIN SODIUM (PORCINE) 1000 UNIT/ML IJ SOLN
INTRAMUSCULAR | Status: DC | PRN
  Administered 2021-07-09: 5000 [IU] via INTRAVENOUS

## 2021-07-09 MED ORDER — SODIUM CHLORIDE 0.9 % IV SOLN: 250.0000 mL | INTRAVENOUS | Status: DC | PRN

## 2021-07-09 MED ORDER — VERAPAMIL HCL 2.5 MG/ML IV SOLN
INTRAVENOUS | Status: DC | PRN
  Administered 2021-07-09 (×2): 2.5 mg via INTRA_ARTERIAL

## 2021-07-09 MED ORDER — LABETALOL HCL 5 MG/ML IV SOLN: 10.0000 mg | INTRAVENOUS | Status: AC | PRN

## 2021-07-09 MED ORDER — VERAPAMIL HCL 2.5 MG/ML IV SOLN: INTRAVENOUS | Status: DC | PRN

## 2021-07-09 MED ORDER — VERAPAMIL HCL 2.5 MG/ML IV SOLN
INTRAVENOUS | Status: AC
  Filled 2021-07-09: qty 2

## 2021-07-09 MED ORDER — SODIUM CHLORIDE 0.9% FLUSH: 3.0000 mL | INTRAVENOUS | Status: DC | PRN

## 2021-07-09 MED ORDER — SODIUM CHLORIDE 0.9 % IV SOLN
INTRAVENOUS | Status: AC | PRN
  Administered 2021-07-09: 500 mL via INTRAVENOUS

## 2021-07-09 MED ORDER — SODIUM CHLORIDE 0.9 % IV BOLUS: 250.0000 mL | Freq: Once | INTRAVENOUS | Status: DC

## 2021-07-09 MED ORDER — FENTANYL CITRATE (PF) 100 MCG/2ML IJ SOLN
INTRAMUSCULAR | Status: DC | PRN
  Administered 2021-07-09 (×2): 25 ug via INTRAVENOUS

## 2021-07-09 MED ORDER — FENTANYL CITRATE (PF) 100 MCG/2ML IJ SOLN
INTRAMUSCULAR | Status: AC
  Filled 2021-07-09: qty 2

## 2021-07-09 MED ORDER — SODIUM CHLORIDE 0.9% FLUSH
3.0000 mL | Freq: Two times a day (BID) | INTRAVENOUS | Status: DC
  Administered 2021-07-10 – 2021-07-12 (×5): 3 mL via INTRAVENOUS

## 2021-07-09 MED ORDER — AMIODARONE HCL 200 MG PO TABS
200.0000 mg | ORAL_TABLET | Freq: Every day | ORAL | Status: DC
  Administered 2021-07-09 – 2021-07-12 (×4): 200 mg via ORAL
  Filled 2021-07-09 (×4): qty 1

## 2021-07-09 MED ORDER — ASPIRIN 81 MG PO CHEW
81.0000 mg | CHEWABLE_TABLET | ORAL | Status: DC
Start: 2021-07-10 — End: 2021-07-09

## 2021-07-09 MED ORDER — IOHEXOL 300 MG/ML  SOLN
INTRAMUSCULAR | Status: DC | PRN
  Administered 2021-07-09: 64 mL via INTRA_ARTERIAL

## 2021-07-09 MED ORDER — DEXTROSE 50 % IV SOLN
INTRAVENOUS | Status: AC
  Administered 2021-07-09: 50 mL via INTRAVENOUS
  Filled 2021-07-09: qty 50

## 2021-07-09 MED ORDER — DEXTROSE 50 % IV SOLN: 1.0000 | Freq: Once | INTRAVENOUS | Status: AC

## 2021-07-09 MED ORDER — METOPROLOL SUCCINATE ER 25 MG PO TB24: 12.5000 mg | ORAL_TABLET | Freq: Every day | ORAL | Status: DC

## 2021-07-09 MED ORDER — IOHEXOL 350 MG/ML SOLN: INTRAVENOUS | Status: DC | PRN

## 2021-07-09 MED ORDER — HEPARIN (PORCINE) IN NACL 2000-0.9 UNIT/L-% IV SOLN
INTRAVENOUS | Status: DC | PRN
  Administered 2021-07-09: 1000 mL

## 2021-07-09 MED ORDER — SODIUM CHLORIDE 0.9 % IV SOLN: INTRAVENOUS | Status: DC

## 2021-07-09 SURGICAL SUPPLY — 14 items
CATH INFINITI 5 FR JL3.5 (CATHETERS) ×2 IMPLANT
CATH INFINITI JR4 5F (CATHETERS) ×2 IMPLANT
CATH LAUNCHER 5F EBU3.0 (CATHETERS) ×1 IMPLANT
CATHETER LAUNCHER 5F EBU3.0 (CATHETERS) ×2
DEVICE RAD TR BAND REGULAR (VASCULAR PRODUCTS) ×2 IMPLANT
DRAPE BRACHIAL (DRAPES) ×2 IMPLANT
GLIDESHEATH SLEND SS 6F .021 (SHEATH) ×2 IMPLANT
GUIDEWIRE INQWIRE 1.5J.035X260 (WIRE) ×1 IMPLANT
INQWIRE 1.5J .035X260CM (WIRE) ×2
PACK CARDIAC CATH (CUSTOM PROCEDURE TRAY) ×2 IMPLANT
PROTECTION STATION PRESSURIZED (MISCELLANEOUS) ×2
SET ATX SIMPLICITY (MISCELLANEOUS) ×2 IMPLANT
STATION PROTECTION PRESSURIZED (MISCELLANEOUS) ×1 IMPLANT
WIRE HITORQ VERSACORE ST 145CM (WIRE) ×2 IMPLANT

## 2021-07-09 NOTE — Progress Notes (Signed)
Informed Dr.Mansy of low blood pressure and holding lasix dosage. Received orders to give a normal saline bolus. Dr.Mansy agreed not to give lasix.

## 2021-07-09 NOTE — Consult Note (Addendum)
ANTICOAGULATION CONSULT NOTE  Pharmacy Consult for Heparin infusion Indication: pulmonary embolus  Patient Measurements: Heparin Dosing Weight: 79 kg   Labs: Recent Labs    07/07/21 0540 07/07/21 1255 07/07/21 1507 07/08/21 0012 07/08/21 0505 07/08/21 1156 07/08/21 1830 07/09/21 0036 07/09/21 0412  HGB 11.1*  --   --   --  11.2*  --   --   --  10.5*  HCT 34.5*  --   --   --  33.7*  --   --   --  31.9*  PLT 338  --   --   --  347  --   --   --  350  HEPARINUNFRC 0.97*   < >  --    < > 0.71* 0.56 0.45 0.48  --   CREATININE 0.69  --  0.76  --  0.65  --   --   --  0.75   < > = values in this interval not displayed.    Estimated Creatinine Clearance: 98.7 mL/min (by C-G formula based on SCr of 0.75 mg/dL).   Medical History: Past Medical History:  Diagnosis Date   Asthma    CAD (coronary artery disease)    Diabetes (HCC)    Hypertension    Medications:  No prior anticoagulation noted  Assessment: 50 y/o female with Hx of recent MI, presents with chest pain. CT positive for RLL PE without evidence of right heart strain. Pharmacy has been consulted for initiation and management of heparin infusion for PE.  Goal of Therapy:  Heparin level 0.3-0.7 units/ml Monitor platelets by anticoagulation protocol: Yes  7/15 0546 HL 0.21, subtherapeutic - 1350 units/hr 7/15 1305 HL 0.22, subtherapeutic - 1500 units/hr 7/15 2109 HL 0.22, subtherapeutic - 1650 units/hr 7/16 0725 HL <0.10, subtherapeutic -inc to 2100 units/hr 7/16 2250 HL 0.91, supratherapeutic dec to 1950 units/hr 7/17 0540 HL 0.97 supratherapeutic  dec to 1800 units/hr 7/17 1255 HL 0.90 Supratherapeutic       Recheck level w/ periph stick  (unable to get periph stick w/ 4 attempts.) Will decrease drip rate to 1650 units/hr 7/18 0012 HL 0.45, therapeutic 7/18 0505 HL 0.71, supratherapeutic 7/18 1156 HL 0.56, therapeutic  7/18 1830 HL 0.45, therapeutic 7/19 0036 HL 0.48, therapeutic X 2    Plan:  Pt went for  cardiac cath 7/19 and heparin infusion was stopped; nurse called to report TR band was off/removed at 1635. Will restart heparin at previous rate of 1550 units/hr two hours after removal and obtain HL 6 hours from restart.  Monitor CBC daily while on heparin   Raiford Noble, PharmD Clinical Pharmacist 07/09/2021 4:52 PM

## 2021-07-09 NOTE — Progress Notes (Signed)
PROGRESS NOTE    Martha Ramos  WUJ:[id number] DOB: 06/23/1971 DOA: 07/04/2021 PCP: Darnelle Spangle, MD   Chief complaint.  Chest pain. Brief Narrative:  Martha Ramos is a 50 y.o. African American female with medical history significant for asthma, type 2 diabetes mellitus, hypertension and coronary artery disease, who presented to the emergency room with acute onset of midsternal chest pain with associated dyspnea as well as cough occasionally productive of clear sputum and occasional wheezing. Upon arriving in the hospital, patient had a severe tachycardia, with heart rate in the 120s, leukocytosis of 20.7, lactic acidosis of 3.2.  CT angiogram of the chest showed a right lower lobe pulmonary emboli.  Mild cardiomegaly with evidence of right heart dysfunction.  Small bilateral pleural effusion with basilar atelectasis.  BNP elevated at 1128.3.  Troponin 101.  Patient was placed on IV Lasix.  She is also treated with Rocephin and Zithromax. She does not seem to have infection, antibiotic discontinued.  Patient is seen by cardiology, echocardiogram showed ejection fraction less than 20%, she appears in cardiogenic shock.  She is also placed on heparin drip for PE.   Assessment & Plan:   Active Problems:   Acute pulmonary embolism (HCC)   Uncontrolled type 2 diabetes mellitus with hyperglycemia (HCC)   Acute on chronic combined systolic (congestive) and diastolic (congestive) heart failure (HCC)   Cardiogenic shock (HCC)   RVF (right ventricular failure) (HCC)  #1.  Acute right lower lobe pulmonary emboli. Elevated troponin secondary to PE. Condition stable, currently on heparin drip.  May change to Eliquis after heart cath.  2.  Acute on chronic systolic congestive heart failure. Right-sided congestive heart failure. Sinus tachycardia. Cardiogenic shock. Nonsustained ventricular tachycardia. Patient initially came in with chest pain, signal tachycardia.  Echocardiogram  showed ejection fraction less than 20%.  Patient was deemed to have cardiogenic shock.  She is currently on metoprolol and IV Lasix.  Followed by cardiology.  He also received a total of 50 g albumin yesterday for hypotension. She has evidence of by chamber failure.  Left and right heart cath is performed today. Patient also had recurrent nonsustained ventricular tachycardia due to severe LV dysfunction.  Currently on amiodarone drip per cardiology.  She is also started on lower dose metoprolol.  #3.  Uncontrolled type 2 diabetes with hyperglycemia. Glucose running low, I will discontinue Lantus.  May restart when glucose running high again.  4.  Lactic acidosis. Appear to be secondary to metformin, no evidence of bacterial infection.   DVT prophylaxis: Heparin Code Status: full Family Communication:  Disposition Plan:    Status is: Inpatient  Remains inpatient appropriate because:Ongoing diagnostic testing needed not appropriate for outpatient work up, IV treatments appropriate due to intensity of illness or inability to take PO, and Inpatient level of care appropriate due to severity of illness  Dispo: The patient is from: Home              Anticipated d/c is to: Home              Patient currently is not medically stable to d/c.   Difficult to place patient No        I/O last 3 completed shifts: In: 1232.6 [P.O.:100; I.V.:817.1; IV Piggyback:315.5] Out: 6150 [Urine:6150] Total I/O In: 0  Out: 3900 [Urine:3900]     Consultants:  Card  Procedures: Heart cath  Antimicrobials: None  Subjective: Patient feels better today, she still has intermittent low blood pressure, received 2 doses of albumin.  He denies any short of breath or cough. She still has intermittent chest pain. No fever or chills. No dysuria hematuria. No headache or dizziness.  Objective: Vitals:   07/09/21 0554 07/09/21 0724 07/09/21 1124 07/09/21 1137  BP: 103/69 98/72 (!) 121/110 124/90   Pulse: 61 67 74   Resp: 17 17 17    Temp: 98.6 F (37 C) 98.6 F (37 C) 98.5 F (36.9 C)   TempSrc:   Oral   SpO2: 99% 96% 94%   Weight:      Height:        Intake/Output Summary (Last 24 hours) at 07/09/2021 1201 Last data filed at 07/09/2021 1122 Gross per 24 hour  Intake 967.26 ml  Output 3900 ml  Net -2932.74 ml   Filed Weights   07/05/21 0200 07/06/21 0139 07/08/21 0500  Weight: 103.9 kg 106.9 kg 103.7 kg    Examination:  General exam: Appears calm and comfortable  Respiratory system: Clear to auscultation. Respiratory effort normal. Cardiovascular system: S1 & S2 heard, RRR. No JVD, murmurs, rubs, gallops or clicks. No pedal edema. Gastrointestinal system: Abdomen is nondistended, soft and nontender. No organomegaly or masses felt. Normal bowel sounds heard. Central nervous system: Alert and oriented. No focal neurological deficits. Extremities: Symmetric 5 x 5 power. Skin: No rashes, lesions or ulcers Psychiatry: Judgement and insight appear normal. Mood & affect appropriate.     Data Reviewed: I have personally reviewed following labs and imaging studies  CBC: Recent Labs  Lab 07/04/21 1942 07/05/21 0546 07/06/21 0725 07/07/21 0540 07/08/21 0505 07/09/21 0412  WBC 20.7* 14.6* 15.3* 13.7* 12.2* 10.1  NEUTROABS 17.6*  --  11.8* 9.8*  --   --   HGB 12.3 11.6* 12.0 11.1* 11.2* 10.5*  HCT 39.0 36.5 36.9 34.5* 33.7* 31.9*  MCV 92.2 91.0 91.1 92.7 89.2 89.9  PLT 372 216 320 338 347 350   Basic Metabolic Panel: Recent Labs  Lab 07/06/21 0725 07/07/21 0540 07/07/21 1507 07/08/21 0505 07/09/21 0412  NA 136 135 137 138 138  K 4.4 4.5 3.8 3.8 3.7  CL 103 101 104 103 99  CO2 23 24 28 30 30   GLUCOSE 213* 249* 111* 86 102*  BUN 15 14 14 12 13   CREATININE 0.68 0.69 0.76 0.65 0.75  CALCIUM 8.9 8.8* 8.8* 8.7* 8.7*  MG 1.8 1.9 1.7 1.7 1.8  PHOS  --   --   --  3.8 4.0   GFR: Estimated Creatinine Clearance: 98.7 mL/min (by C-G formula based on SCr of 0.75  mg/dL). Liver Function Tests: Recent Labs  Lab 07/04/21 1942 07/05/21 0546  AST 39 20  ALT 49* 40  ALKPHOS 65 58  BILITOT 0.8 0.8  PROT 6.6 6.3*  ALBUMIN 3.3* 3.0*   Recent Labs  Lab 07/04/21 1942  LIPASE 34   No results for input(s): AMMONIA in the last 168 hours. Coagulation Profile: Recent Labs  Lab 07/04/21 1942  INR 1.1   Cardiac Enzymes: No results for input(s): CKTOTAL, CKMB, CKMBINDEX, TROPONINI in the last 168 hours. BNP (last 3 results) No results for input(s): PROBNP in the last 8760 hours. HbA1C: No results for input(s): HGBA1C in the last 72 hours. CBG: Recent Labs  Lab 07/08/21 1113 07/08/21 1736 07/08/21 2045 07/09/21 0725 07/09/21 1126  GLUCAP 171* 152* 160* 143* 76   Lipid Profile: No results for input(s): CHOL, HDL, LDLCALC, TRIG, CHOLHDL, LDLDIRECT in the last 72 hours. Thyroid Function Tests: No results for input(s): TSH, T4TOTAL, FREET4, T3FREE, THYROIDAB  in the last 72 hours. Anemia Panel: No results for input(s): VITAMINB12, FOLATE, FERRITIN, TIBC, IRON, RETICCTPCT in the last 72 hours. Sepsis Labs: Recent Labs  Lab 07/04/21 1942 07/05/21 0037 07/05/21 0546 07/06/21 0837  PROCALCITON  --   --  0.23  --   LATICACIDVEN 3.2* 2.6*  --  1.9    Recent Results (from the past 240 hour(s))  Resp Panel by RT-PCR (Flu A&B, Covid) Nasopharyngeal Swab     Status: None   Collection Time: 07/04/21  9:38 PM   Specimen: Nasopharyngeal Swab; Nasopharyngeal(NP) swabs in vial transport medium  Result Value Ref Range Status   SARS Coronavirus 2 by RT PCR NEGATIVE NEGATIVE Final    Comment: (NOTE) SARS-CoV-2 target nucleic acids are NOT DETECTED.  The SARS-CoV-2 RNA is generally detectable in upper respiratory specimens during the acute phase of infection. The lowest concentration of SARS-CoV-2 viral copies this assay can detect is 138 copies/mL. A negative result does not preclude SARS-Cov-2 infection and should not be used as the sole basis  for treatment or other patient management decisions. A negative result may occur with  improper specimen collection/handling, submission of specimen other than nasopharyngeal swab, presence of viral mutation(s) within the areas targeted by this assay, and inadequate number of viral copies(<138 copies/mL). A negative result must be combined with clinical observations, patient history, and epidemiological information. The expected result is Negative.  Fact Sheet for Patients:  BloggerCourse.com  Fact Sheet for Healthcare Providers:  SeriousBroker.it  This test is no t yet approved or cleared by the [place] FDA and  has been authorized for detection and/or diagnosis of SARS-CoV-2 by FDA under an Emergency Use Authorization (EUA). This EUA will remain  in effect (meaning this test can be used) for the duration of the COVID-19 declaration under Section 564(b)(1) of the Act, 21 U.S.C.section 360bbb-3(b)(1), unless the authorization is terminated  or revoked sooner.       Influenza A by PCR NEGATIVE NEGATIVE Final   Influenza B by PCR NEGATIVE NEGATIVE Final    Comment: (NOTE) The Xpert Xpress SARS-CoV-2/FLU/RSV plus assay is intended as an aid in the diagnosis of influenza from Nasopharyngeal swab specimens and should not be used as a sole basis for treatment. Nasal washings and aspirates are unacceptable for Xpert Xpress SARS-CoV-2/FLU/RSV testing.  Fact Sheet for Patients: BloggerCourse.com  Fact Sheet for Healthcare Providers: SeriousBroker.it  This test is not yet approved or cleared by the [place] FDA and has been authorized for detection and/or diagnosis of SARS-CoV-2 by FDA under an Emergency Use Authorization (EUA). This EUA will remain in effect (meaning this test can be used) for the duration of the COVID-19 declaration under Section 564(b)(1) of the Act, 21  U.S.C. section 360bbb-3(b)(1), unless the authorization is terminated or revoked.  Performed at [facility], [address] [place], [place] [address]   Culture, blood (routine x 2)     Status: None   Collection Time: 07/04/21 10:01 PM   Specimen: BLOOD  Result Value Ref Range Status   Specimen Description BLOOD BLOOD RIGHT HAND  Final   Special Requests   Final    BOTTLES DRAWN AEROBIC AND ANAEROBIC Blood Culture adequate volume   Culture   Final    NO GROWTH 5 DAYS Performed at [facility], [address] [place], [place] [address]    Report Status 07/09/2021 FINAL  Final  Culture, blood (routine x 2)     Status: None   Collection Time:  07/04/21 10:01 PM   Specimen: BLOOD  Result Value Ref Range Status   Specimen Description BLOOD RIGHT ANTECUBITAL  Final   Special Requests   Final    BOTTLES DRAWN AEROBIC AND ANAEROBIC Blood Culture adequate volume   Culture   Final    NO GROWTH 5 DAYS Performed at [facility], [address], [place], [place] [address]    Report Status 07/09/2021 FINAL  Final  Urine Culture     Status: None   Collection Time: 07/04/21 11:30 PM   Specimen: Urine, Catheterized  Result Value Ref Range Status   Specimen Description   Final    URINE, CATHETERIZED Performed at [facility], [address] [place], [place] [address]    Special Requests   Final    NONE Performed at [facility], [address], [place], [place] [address]    Culture   Final    NO GROWTH Performed at [facility], [address] [place], [place] [address]    Report Status 07/06/2021 FINAL  Final  MRSA Next Gen by PCR, Nasal     Status: None   Collection Time: 07/05/21  2:15 AM   Specimen: Nasal Mucosa; Nasal Swab  Result Value Ref Range Status   MRSA by PCR Next Gen NOT DETECTED NOT DETECTED Final    Comment: (NOTE) The GeneXpert MRSA Assay (FDA approved for NASAL specimens only), is one component of a  comprehensive MRSA colonization surveillance program. It is not intended to diagnose MRSA infection nor to guide or monitor treatment for MRSA infections. Test performance is not FDA approved in patients less than 63 years old. Performed at [facility], [address], [place], [place] [address]          Radiology Studies: No results found.      Scheduled Meds:  amiodarone  200 mg Oral Daily   [START ON 07/10/2021] aspirin  81 mg Oral Pre-Cath   atorvastatin  20 mg Oral Daily   Chlorhexidine Gluconate Cloth  6 each Topical Q0600   digoxin  0.125 mg Intravenous Daily   DULoxetine  60 mg Oral Daily   furosemide  80 mg Intravenous Q12H   haloperidol  10 mg Oral QHS   insulin aspart  0-15 Units Subcutaneous TID AC & HS   insulin glargine  20 Units Subcutaneous QHS   mouth rinse  15 mL Mouth Rinse BID   metoprolol tartrate  12.5 mg Oral BID   pantoprazole  40 mg Oral Daily   potassium chloride SA  20 mEq Oral Daily   potassium phosphate (monobasic)  500 mg Oral BID   sodium chloride flush  3 mL Intravenous Q12H   Continuous Infusions:  sodium chloride     [START ON 07/10/2021] sodium chloride     heparin 1,550 Units/hr (07/09/21 0041)   sodium chloride       LOS: 5 days    Time spent: 32 minutes    Marrion Coy, MD [facility]   To contact the attending provider between 7A-7P or the covering provider during after hours 7P-7A, please log into the web site [email or web address] and access using universal [place] password for that web site. If you do not have the password, please call the hospital operator.  07/09/2021, 12:01 PM

## 2021-07-09 NOTE — Progress Notes (Signed)
    Spoke with pts dtrs this morning re: plan for cath.  The patient and family understand that risks include but are not limited to stroke (1 in 1000), death (1 in 1000), kidney failure [usually temporary] (1 in 500), bleeding (1 in 200), allergic reaction [possibly serious] (1 in 200), and pt/family agree to proceed.  All questions answered.    Nicolasa Ducking, NP

## 2021-07-09 NOTE — Care Management Important Message (Signed)
Important Message  Patient Details  Name: Martha Ramos MRN: [id number] Date of Birth: 1971-12-18   Medicare Important Message Given:  Yes     Johnell Comings 07/09/2021, 10:52 AM

## 2021-07-09 NOTE — H&P (View-Only) (Signed)
Progress Note  Patient Name: Martha Ramos Date of Encounter: 07/09/2021  Primary Cardiologist: Julien Nordmann, MD  Subjective   No chest pain or sob overnight.  Lying flat this AM.  More conversant than previously described.  I've reached out to dtr but was unable to talk w/ her - left message.  Pt is NPO for cath.    Inpatient Medications    Scheduled Meds:  amiodarone  200 mg Oral Daily   [START ON 07/10/2021] aspirin  81 mg Oral Pre-Cath   atorvastatin  20 mg Oral Daily   Chlorhexidine Gluconate Cloth  6 each Topical Q0600   digoxin  0.125 mg Intravenous Daily   DULoxetine  60 mg Oral Daily   furosemide  80 mg Intravenous Q12H   haloperidol  10 mg Oral QHS   insulin aspart  0-15 Units Subcutaneous TID AC & HS   insulin glargine  20 Units Subcutaneous QHS   mouth rinse  15 mL Mouth Rinse BID   metoprolol tartrate  12.5 mg Oral BID   pantoprazole  40 mg Oral Daily   potassium chloride SA  20 mEq Oral Daily   potassium phosphate (monobasic)  500 mg Oral BID   sodium chloride flush  3 mL Intravenous Q12H   Continuous Infusions:  sodium chloride     [START ON 07/10/2021] sodium chloride     heparin 1,550 Units/hr (07/09/21 0041)   sodium chloride     PRN Meds: sodium chloride, acetaminophen **OR** acetaminophen, albuterol, magnesium hydroxide, morphine injection, ondansetron **OR** ondansetron (ZOFRAN) IV, sodium chloride flush, traZODone   Vital Signs    Vitals:   07/09/21 0126 07/09/21 0415 07/09/21 0554 07/09/21 0724  BP: (!) 88/63 (!) 81/63 103/69 98/72  Pulse: (!) 110 88 61 67  Resp:  17 17 17   Temp:  (!) 97.4 F (36.3 C) 98.6 F (37 C) 98.6 F (37 C)  TempSrc:  Oral    SpO2:  100% 99% 96%  Weight:      Height:        Intake/Output Summary (Last 24 hours) at 07/09/2021 1056 Last data filed at 07/09/2021 1010 Gross per 24 hour  Intake 967.26 ml  Output 3100 ml  Net -2132.74 ml   Filed Weights   07/05/21 0200 07/06/21 0139 07/08/21 0500  Weight:  103.9 kg 106.9 kg 103.7 kg    Physical Exam   GEN: Obese, in no acute distress.  HEENT: Grossly normal.  Neck: Supple, obese, difficult to gauge JVP.  No carotid bruits, or masses. Cardiac: RRR, tachy, +S3, 2/6 syst murmur @ LLSB. No clubbing, cyanosis, edema.  Radials 2+, DP/PT 2+ and equal bilaterally.  Respiratory:  Respirations regular and unlabored, diminished breath sounds bilaterally. GI: Obese,soft, nontender, nondistended, BS + x 4. MS: no deformity or atrophy. Skin: warm and dry, no rash. Neuro:  Strength and sensation are intact. Psych: AAOx3.  Flat affect.  Labs    Chemistry Recent Labs  Lab 07/04/21 1942 07/05/21 0546 07/06/21 0725 07/07/21 1507 07/08/21 0505 07/09/21 0412  NA 136 137   < > 137 138 138  K 3.9 4.1   < > 3.8 3.8 3.7  CL 103 106   < > 104 103 99  CO2 22 24   < > 28 30 30   GLUCOSE 339* 266*   < > 111* 86 102*  BUN 13 12   < > 14 12 13   CREATININE 0.87 0.64   < > 0.76 0.65 0.75  CALCIUM 8.9  8.6*   < > 8.8* 8.7* 8.7*  PROT 6.6 6.3*  --   --   --   --   ALBUMIN 3.3* 3.0*  --   --   --   --   AST 39 20  --   --   --   --   ALT 49* 40  --   --   --   --   ALKPHOS 65 58  --   --   --   --   BILITOT 0.8 0.8  --   --   --   --   GFRNONAA >60 >60   < > >60 >60 >60  ANIONGAP 11 7   < > 5 5 9   < > = values in this interval not displayed.     Hematology Recent Labs  Lab 07/07/21 0540 07/08/21 0505 07/09/21 0412  WBC 13.7* 12.2* 10.1  RBC 3.72* 3.78* 3.55*  HGB 11.1* 11.2* 10.5*  HCT 34.5* 33.7* 31.9*  MCV 92.7 89.2 89.9  MCH 29.8 29.6 29.6  MCHC 32.2 33.2 32.9  RDW 14.6 14.5 14.2  PLT 338 347 350    Cardiac Enzymes  Recent Labs  Lab 07/04/21 1942 07/04/21 2138  TROPONINIHS 101* 92*      BNP Recent Labs  Lab 07/04/21 1942 07/05/21 0546 07/06/21 0837  BNP 1,128.3* 1,356.8* 1,430.3*     HbA1c  Lab Results  Component Value Date   HGBA1C 6.2 (H) 07/06/2021    Radiology    US Venous Img Lower Bilateral (DVT)  Result  Date: 07/05/2021 CLINICAL DATA:  49-year-old female with PE. Evaluate for residual DVT. EXAM: BILATERAL LOWER EXTREMITY VENOUS DOPPLER ULTRASOUND TECHNIQUE: Gray-scale sonography with graded compression, as well as color Doppler and duplex ultrasound were performed to evaluate the lower extremity deep venous systems from the level of the common femoral vein and including the common femoral, femoral, profunda femoral, popliteal and calf veins including the posterior tibial, peroneal and gastrocnemius veins when visible. The superficial great saphenous vein was also interrogated. Spectral Doppler was utilized to evaluate flow at rest and with distal augmentation maneuvers in the common femoral, femoral and popliteal veins. COMPARISON:  None. FINDINGS: RIGHT LOWER EXTREMITY Common Femoral Vein: No evidence of thrombus. Normal compressibility, respiratory phasicity and response to augmentation. Saphenofemoral Junction: No evidence of thrombus. Normal compressibility and flow on color Doppler imaging. Profunda Femoral Vein: No evidence of thrombus. Normal compressibility and flow on color Doppler imaging. Femoral Vein: No evidence of thrombus. Normal compressibility, respiratory phasicity and response to augmentation. Popliteal Vein: No evidence of thrombus. Normal compressibility, respiratory phasicity and response to augmentation. Calf Veins: No evidence of thrombus. Normal compressibility and flow on color Doppler imaging. Superficial Great Saphenous Vein: No evidence of thrombus. Normal compressibility. Venous Reflux:  None. Other Findings:  None. LEFT LOWER EXTREMITY Common Femoral Vein: No evidence of thrombus. Normal compressibility, respiratory phasicity and response to augmentation. Saphenofemoral Junction: No evidence of thrombus. Normal compressibility and flow on color Doppler imaging. Profunda Femoral Vein: No evidence of thrombus. Normal compressibility and flow on color Doppler imaging. Femoral Vein: No  evidence of thrombus. Normal compressibility, respiratory phasicity and response to augmentation. Popliteal Vein: No evidence of thrombus. Normal compressibility, respiratory phasicity and response to augmentation. Calf Veins: No evidence of thrombus. Normal compressibility and flow on color Doppler imaging. Superficial Great Saphenous Vein: No evidence of thrombus. Normal compressibility. Venous Reflux:  None. Other Findings:  None. IMPRESSION: No evidence of deep venous thrombosis   in either lower extremity. Electronically Signed   By: Malachy Moan M.D.   On: 07/05/2021 11:04   US Venous Img Upper Uni Right(DVT)  Result Date: 07/05/2021 CLINICAL DATA:  Pulmonary embolus, right upper extremity pain and edema EXAM: RIGHT UPPER EXTREMITY VENOUS DOPPLER ULTRASOUND TECHNIQUE: Gray-scale sonography with graded compression, as well as color Doppler and duplex ultrasound were performed to evaluate the upper extremity deep venous system from the level of the subclavian vein and including the jugular, axillary, basilic, radial, ulnar and upper cephalic vein. Spectral Doppler was utilized to evaluate flow at rest and with distal augmentation maneuvers. COMPARISON:  None. FINDINGS: Contralateral Subclavian Vein: Respiratory phasicity is normal and symmetric with the symptomatic side. No evidence of thrombus. Normal compressibility. Internal Jugular Vein: No evidence of thrombus. Normal compressibility, respiratory phasicity and response to augmentation. Subclavian Vein: No evidence of thrombus. Normal compressibility, respiratory phasicity and response to augmentation. Axillary Vein: No evidence of thrombus. Normal compressibility, respiratory phasicity and response to augmentation. Cephalic Vein: No evidence of thrombus. Normal compressibility, respiratory phasicity and response to augmentation. Basilic Vein: No evidence of thrombus. Normal compressibility, respiratory phasicity and response to augmentation. Brachial  Veins: No evidence of thrombus. Normal compressibility, respiratory phasicity and response to augmentation. Radial Veins: No evidence of thrombus. Normal compressibility, respiratory phasicity and response to augmentation. Ulnar Veins: No evidence of thrombus. Normal compressibility, respiratory phasicity and response to augmentation. IMPRESSION: No evidence of DVT within the right upper extremity. Electronically Signed   By: Judie Petit.  Shick M.D.   On: 07/05/2021 12:33   DG Chest Port 1 View  Result Date: 07/06/2021 CLINICAL DATA:  Check line placement EXAM: PORTABLE CHEST 1 VIEW COMPARISON:  July 04, 2021 FINDINGS: The new right central line terminates just in the right side of the atrium, approximately 2 cm above the caval atrial junction. No pneumothorax. No other changes. IMPRESSION: The new central line terminates 2 cm distal to the caval atrial junction. Recommend withdrawing 2 cm. No pneumothorax. No other change. These results will be called to the ordering clinician or representative by the Radiologist Assistant, and communication documented in the PACS or [employer]. Electronically Signed   By: Gerome Sam III M.D   On: 07/06/2021 16:00   [place] EKG SITE RITE  Result Date: 07/06/2021 If Site Rite image not attached, placement could not be confirmed due to current cardiac rhythm.   Telemetry    RSR, Sinus tachy, PACs, brief run of SVT. 6, 7, and 24 beats of NSVT - Personally Reviewed  Cardiac Studies   Echo  1. Severely dilated systolic cardiomyopathy.   2. Left ventricular ejection fraction, by estimation, is <20%. The left  ventricle has severely decreased function. The left ventricle has no  regional wall motion abnormalities. The left ventricular internal cavity  size was severely dilated. Left  ventricular diastolic parameters were normal.   3. Right ventricular systolic function is severely reduced. The right  ventricular size is moderately enlarged.   4. Left atrial size was  moderately dilated.   5. Right atrial size was moderately dilated.   6. The mitral valve is grossly normal. Moderate mitral valve  regurgitation.   7. Tricuspid valve regurgitation is moderate.   8. The aortic valve is grossly normal. Aortic valve regurgitation is not  visualized.  Patient Profile     50 y.o. 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   Assessment & Plan    1.  Acute PE/RV failure:  Admitted 7/14 w/ dyspnea and shock in the setting of RLL PE.  Echo w/ sev reduced RV fxn.  Remains on heparin w/ plan to transition to eliquis post-cath (in setting of LV dysfxn).  2.  Acute systolic and diastolic CHF/Cardiogenic Shock: EF <20% by echo this admission, prev nl in 03/2020.  Off milrinone.  Exam challenging due to body habitus.  Able to lie flat however.  She remains on IV lasix w/ stable renal fxn.  Minus 1.1L yesterday and minus 6.6 L since admission.  Plan R and left heart cath today. Pt understands and is willing to proceed but I am awaiting a call back from her dtr for additional consent.  Cont ? blocker as bp allows - held this AM in setting of hypotension.  Due to soft BPs/hypotension, no acei/arb/arni/spiro.  She may not tolerate SGLT2i @ this point.  Will cont to look for opportunities to improve GDMT.  3.  Bipolar Disorder:  Flat affect.  Nightly haldol.  Per IM.  4.  WCT/NSVT:  up to 24 beats NSVT yesterday afternoon.  Cont amio and low dose ? blocker as tolerate.  Signed, Nicolasa Ducking, NP  07/09/2021, 10:56 AM    For questions or updates, please contact   Please consult [email or web address] for contact info under Cardiology/STEMI.

## 2021-07-09 NOTE — H&P (View-Only) (Signed)
    Spoke with pts dtrs this morning re: plan for cath.  The patient and family understand that risks include but are not limited to stroke (1 in 1000), death (1 in 1000), kidney failure [usually temporary] (1 in 500), bleeding (1 in 200), allergic reaction [possibly serious] (1 in 200), and pt/family agree to proceed.  All questions answered.    Martha Peyser, NP  

## 2021-07-09 NOTE — Interval H&P Note (Signed)
History and Physical Interval Note:  07/09/2021 2:18 PM  Martha Ramos  has presented today for surgery, with the diagnosis of LT Heart Cath   Cardiomyopathy.  The various methods of treatment have been discussed with the patient and family. After consideration of risks, benefits and other options for treatment, the patient has consented to  Procedure(s): LEFT HEART CATH AND CORONARY ANGIOGRAPHY (N/A) as a surgical intervention.  The patient's history has been reviewed, patient examined, no change in status, stable for surgery.  I have reviewed the patient's chart and labs.  Questions were answered to the patient's satisfaction.     Tonny Bollman

## 2021-07-09 NOTE — Interval H&P Note (Signed)
History and Physical Interval Note:  07/09/2021 2:18 PM  Rakel Bakken  has presented today for surgery, with the diagnosis of LT Heart Cath   Cardiomyopathy.  The various methods of treatment have been discussed with the patient and family. After consideration of risks, benefits and other options for treatment, the patient has consented to  Procedure(s): LEFT HEART CATH AND CORONARY ANGIOGRAPHY (N/A) as a surgical intervention.  The patient's history has been reviewed, patient examined, no change in status, stable for surgery.  I have reviewed the patient's chart and labs.  Questions were answered to the patient's satisfaction.     Meeah Totino   

## 2021-07-09 NOTE — Progress Notes (Signed)
Patient's blood pressure has decreased to 81/63. Discussed with Dr. Arville Care, Amio drip has been discontinued. Patient is asymptomatic. Patient is in normal sinus rhythm. Will continue to monitor and keep MD updated.

## 2021-07-09 NOTE — Consult Note (Signed)
ANTICOAGULATION CONSULT NOTE  Pharmacy Consult for Heparin infusion Indication: pulmonary embolus  Patient Measurements: Heparin Dosing Weight: 79 kg   Labs: Recent Labs    07/06/21 0725 07/06/21 2250 07/07/21 0540 07/07/21 1255 07/07/21 1507 07/08/21 0012 07/08/21 0505 07/08/21 1156 07/08/21 1830 07/09/21 0036  HGB 12.0  --  11.1*  --   --   --  11.2*  --   --   --   HCT 36.9  --  34.5*  --   --   --  33.7*  --   --   --   PLT 320  --  338  --   --   --  347  --   --   --   HEPARINUNFRC <0.10*   < > 0.97*   < >  --    < > 0.71* 0.56 0.45 0.48  CREATININE 0.68  --  0.69  --  0.76  --  0.65  --   --   --    < > = values in this interval not displayed.    Estimated Creatinine Clearance: 98.7 mL/min (by C-G formula based on SCr of 0.65 mg/dL).   Medical History: Past Medical History:  Diagnosis Date   Asthma    CAD (coronary artery disease)    Diabetes (HCC)    Hypertension    Medications:  No prior anticoagulation noted  Assessment: 50 y/o female with Hx of recent MI, presents with chest pain. CT positive for RLL PE without evidence of right heart strain. Pharmacy has been consulted for initiation and management of heparin infusion for PE.  Goal of Therapy:  Heparin level 0.3-0.7 units/ml Monitor platelets by anticoagulation protocol: Yes  7/15 0546 HL 0.21, subtherapeutic - 1350 units/hr 7/15 1305 HL 0.22, subtherapeutic - 1500 units/hr 7/15 2109 HL 0.22, subtherapeutic - 1650 units/hr 7/16 0725 HL <0.10, subtherapeutic -inc to 2100 units/hr 7/16 2250 HL 0.91, supratherapeutic dec to 1950 units/hr 7/17 0540 HL 0.97 supratherapeutic  dec to 1800 units/hr 7/17 1255 HL 0.90 Supratherapeutic       Recheck level w/ periph stick  (unable to get periph stick w/ 4 attempts.) Will decrease drip rate to 1650 units/hr 7/18 0012 HL 0.45, therapeutic 7/18 0505 HL 0.71, supratherapeutic 7/18 1156 HL 0.56, therapeutic  7/18 1830 HL 0.45, therapeutic 7/19 0036 HL 0.48,  therapeutic X 2    Plan:  7/19: HL @ 0036 = 0.48, therapeutic X 2  Will continue this pt on current rate and recheck HL on 7/20 with AM labs.   Scherrie Gerlach, PharmD Clinical Pharmacist 07/09/2021 1:22 AM

## 2021-07-09 NOTE — Progress Notes (Signed)
Progress Note  Patient Name: Martha Ramos Date of Encounter: 07/09/2021  Primary Cardiologist: Julien Nordmann, MD  Subjective   No chest pain or sob overnight.  Lying flat this AM.  More conversant than previously described.  I've reached out to dtr but was unable to talk w/ her - left message.  Pt is NPO for cath.    Inpatient Medications    Scheduled Meds:  amiodarone  200 mg Oral Daily   [START ON 07/10/2021] aspirin  81 mg Oral Pre-Cath   atorvastatin  20 mg Oral Daily   Chlorhexidine Gluconate Cloth  6 each Topical Q0600   digoxin  0.125 mg Intravenous Daily   DULoxetine  60 mg Oral Daily   furosemide  80 mg Intravenous Q12H   haloperidol  10 mg Oral QHS   insulin aspart  0-15 Units Subcutaneous TID AC & HS   insulin glargine  20 Units Subcutaneous QHS   mouth rinse  15 mL Mouth Rinse BID   metoprolol tartrate  12.5 mg Oral BID   pantoprazole  40 mg Oral Daily   potassium chloride SA  20 mEq Oral Daily   potassium phosphate (monobasic)  500 mg Oral BID   sodium chloride flush  3 mL Intravenous Q12H   Continuous Infusions:  sodium chloride     [START ON 07/10/2021] sodium chloride     heparin 1,550 Units/hr (07/09/21 0041)   sodium chloride     PRN Meds: sodium chloride, acetaminophen **OR** acetaminophen, albuterol, magnesium hydroxide, morphine injection, ondansetron **OR** ondansetron (ZOFRAN) IV, sodium chloride flush, traZODone   Vital Signs    Vitals:   07/09/21 0126 07/09/21 0415 07/09/21 0554 07/09/21 0724  BP: (!) 88/63 (!) 81/63 103/69 98/72  Pulse: (!) 110 88 61 67  Resp:  17 17 17   Temp:  (!) 97.4 F (36.3 C) 98.6 F (37 C) 98.6 F (37 C)  TempSrc:  Oral    SpO2:  100% 99% 96%  Weight:      Height:        Intake/Output Summary (Last 24 hours) at 07/09/2021 1056 Last data filed at 07/09/2021 1010 Gross per 24 hour  Intake 967.26 ml  Output 3100 ml  Net -2132.74 ml   Filed Weights   07/05/21 0200 07/06/21 0139 07/08/21 0500  Weight:  103.9 kg 106.9 kg 103.7 kg    Physical Exam   GEN: Obese, in no acute distress.  HEENT: Grossly normal.  Neck: Supple, obese, difficult to gauge JVP.  No carotid bruits, or masses. Cardiac: RRR, tachy, +S3, 2/6 syst murmur @ LLSB. No clubbing, cyanosis, edema.  Radials 2+, DP/PT 2+ and equal bilaterally.  Respiratory:  Respirations regular and unlabored, diminished breath sounds bilaterally. GI: Obese,soft, nontender, nondistended, BS + x 4. MS: no deformity or atrophy. Skin: warm and dry, no rash. Neuro:  Strength and sensation are intact. Psych: AAOx3.  Flat affect.  Labs    Chemistry Recent Labs  Lab 07/04/21 1942 07/05/21 0546 07/06/21 0725 07/07/21 1507 07/08/21 0505 07/09/21 0412  NA 136 137   < > 137 138 138  K 3.9 4.1   < > 3.8 3.8 3.7  CL 103 106   < > 104 103 99  CO2 22 24   < > 28 30 30   GLUCOSE 339* 266*   < > 111* 86 102*  BUN 13 12   < > 14 12 13   CREATININE 0.87 0.64   < > 0.76 0.65 0.75  CALCIUM 8.9  8.6*   < > 8.8* 8.7* 8.7*  PROT 6.6 6.3*  --   --   --   --   ALBUMIN 3.3* 3.0*  --   --   --   --   AST 39 20  --   --   --   --   ALT 49* 40  --   --   --   --   ALKPHOS 65 58  --   --   --   --   BILITOT 0.8 0.8  --   --   --   --   GFRNONAA >60 >60   < > >60 >60 >60  ANIONGAP 11 7   < > 5 5 9    < > = values in this interval not displayed.     Hematology Recent Labs  Lab 07/07/21 0540 07/08/21 0505 07/09/21 0412  WBC 13.7* 12.2* 10.1  RBC 3.72* 3.78* 3.55*  HGB 11.1* 11.2* 10.5*  HCT 34.5* 33.7* 31.9*  MCV 92.7 89.2 89.9  MCH 29.8 29.6 29.6  MCHC 32.2 33.2 32.9  RDW 14.6 14.5 14.2  PLT 338 347 350    Cardiac Enzymes  Recent Labs  Lab 07/04/21 1942 07/04/21 2138  TROPONINIHS 101* 92*      BNP Recent Labs  Lab 07/04/21 1942 07/05/21 0546 07/06/21 0837  BNP 1,128.3* 1,356.8* 1,430.3*     HbA1c  Lab Results  Component Value Date   HGBA1C 6.2 (H) 07/06/2021    Radiology    US Venous Img Lower Bilateral (DVT)  Result  Date: 07/05/2021 CLINICAL DATA:  51 year old female with PE. Evaluate for residual DVT. EXAM: BILATERAL LOWER EXTREMITY VENOUS DOPPLER ULTRASOUND TECHNIQUE: Gray-scale sonography with graded compression, as well as color Doppler and duplex ultrasound were performed to evaluate the lower extremity deep venous systems from the level of the common femoral vein and including the common femoral, femoral, profunda femoral, popliteal and calf veins including the posterior tibial, peroneal and gastrocnemius veins when visible. The superficial great saphenous vein was also interrogated. Spectral Doppler was utilized to evaluate flow at rest and with distal augmentation maneuvers in the common femoral, femoral and popliteal veins. COMPARISON:  None. FINDINGS: RIGHT LOWER EXTREMITY Common Femoral Vein: No evidence of thrombus. Normal compressibility, respiratory phasicity and response to augmentation. Saphenofemoral Junction: No evidence of thrombus. Normal compressibility and flow on color Doppler imaging. Profunda Femoral Vein: No evidence of thrombus. Normal compressibility and flow on color Doppler imaging. Femoral Vein: No evidence of thrombus. Normal compressibility, respiratory phasicity and response to augmentation. Popliteal Vein: No evidence of thrombus. Normal compressibility, respiratory phasicity and response to augmentation. Calf Veins: No evidence of thrombus. Normal compressibility and flow on color Doppler imaging. Superficial Great Saphenous Vein: No evidence of thrombus. Normal compressibility. Venous Reflux:  None. Other Findings:  None. LEFT LOWER EXTREMITY Common Femoral Vein: No evidence of thrombus. Normal compressibility, respiratory phasicity and response to augmentation. Saphenofemoral Junction: No evidence of thrombus. Normal compressibility and flow on color Doppler imaging. Profunda Femoral Vein: No evidence of thrombus. Normal compressibility and flow on color Doppler imaging. Femoral Vein: No  evidence of thrombus. Normal compressibility, respiratory phasicity and response to augmentation. Popliteal Vein: No evidence of thrombus. Normal compressibility, respiratory phasicity and response to augmentation. Calf Veins: No evidence of thrombus. Normal compressibility and flow on color Doppler imaging. Superficial Great Saphenous Vein: No evidence of thrombus. Normal compressibility. Venous Reflux:  None. Other Findings:  None. IMPRESSION: No evidence of deep venous thrombosis  in either lower extremity. Electronically Signed   By: Malachy Moan M.D.   On: 07/05/2021 11:04   US Venous Img Upper Uni Right(DVT)  Result Date: 07/05/2021 CLINICAL DATA:  Pulmonary embolus, right upper extremity pain and edema EXAM: RIGHT UPPER EXTREMITY VENOUS DOPPLER ULTRASOUND TECHNIQUE: Gray-scale sonography with graded compression, as well as color Doppler and duplex ultrasound were performed to evaluate the upper extremity deep venous system from the level of the subclavian vein and including the jugular, axillary, basilic, radial, ulnar and upper cephalic vein. Spectral Doppler was utilized to evaluate flow at rest and with distal augmentation maneuvers. COMPARISON:  None. FINDINGS: Contralateral Subclavian Vein: Respiratory phasicity is normal and symmetric with the symptomatic side. No evidence of thrombus. Normal compressibility. Internal Jugular Vein: No evidence of thrombus. Normal compressibility, respiratory phasicity and response to augmentation. Subclavian Vein: No evidence of thrombus. Normal compressibility, respiratory phasicity and response to augmentation. Axillary Vein: No evidence of thrombus. Normal compressibility, respiratory phasicity and response to augmentation. Cephalic Vein: No evidence of thrombus. Normal compressibility, respiratory phasicity and response to augmentation. Basilic Vein: No evidence of thrombus. Normal compressibility, respiratory phasicity and response to augmentation. Brachial  Veins: No evidence of thrombus. Normal compressibility, respiratory phasicity and response to augmentation. Radial Veins: No evidence of thrombus. Normal compressibility, respiratory phasicity and response to augmentation. Ulnar Veins: No evidence of thrombus. Normal compressibility, respiratory phasicity and response to augmentation. IMPRESSION: No evidence of DVT within the right upper extremity. Electronically Signed   By: Judie Petit.  Shick M.D.   On: 07/05/2021 12:33   DG Chest Port 1 View  Result Date: 07/06/2021 CLINICAL DATA:  Check line placement EXAM: PORTABLE CHEST 1 VIEW COMPARISON:  July 04, 2021 FINDINGS: The new right central line terminates just in the right side of the atrium, approximately 2 cm above the caval atrial junction. No pneumothorax. No other changes. IMPRESSION: The new central line terminates 2 cm distal to the caval atrial junction. Recommend withdrawing 2 cm. No pneumothorax. No other change. These results will be called to the ordering clinician or representative by the Radiologist Assistant, and communication documented in the PACS or [employer]. Electronically Signed   By: Gerome Sam III M.D   On: 07/06/2021 16:00   [place] EKG SITE RITE  Result Date: 07/06/2021 If Site Rite image not attached, placement could not be confirmed due to current cardiac rhythm.   Telemetry    RSR, Sinus tachy, PACs, brief run of SVT. 6, 7, and 24 beats of NSVT - Personally Reviewed  Cardiac Studies   Echo  1. Severely dilated systolic cardiomyopathy.   2. Left ventricular ejection fraction, by estimation, is <20%. The left  ventricle has severely decreased function. The left ventricle has no  regional wall motion abnormalities. The left ventricular internal cavity  size was severely dilated. Left  ventricular diastolic parameters were normal.   3. Right ventricular systolic function is severely reduced. The right  ventricular size is moderately enlarged.   4. Left atrial size was  moderately dilated.   5. Right atrial size was moderately dilated.   6. The mitral valve is grossly normal. Moderate mitral valve  regurgitation.   7. Tricuspid valve regurgitation is moderate.   8. The aortic valve is grossly normal. Aortic valve regurgitation is not  visualized.  Patient Profile     50 y.o. 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   Assessment & Plan    1.  Acute PE/RV failure:  Admitted 7/14 w/ dyspnea and shock in the setting of RLL PE.  Echo w/ sev reduced RV fxn.  Remains on heparin w/ plan to transition to eliquis post-cath (in setting of LV dysfxn).  2.  Acute systolic and diastolic CHF/Cardiogenic Shock: EF <20% by echo this admission, prev nl in 03/2020.  Off milrinone.  Exam challenging due to body habitus.  Able to lie flat however.  She remains on IV lasix w/ stable renal fxn.  Minus 1.1L yesterday and minus 6.6 L since admission.  Plan R and left heart cath today. Pt understands and is willing to proceed but I am awaiting a call back from her dtr for additional consent.  Cont ? blocker as bp allows - held this AM in setting of hypotension.  Due to soft BPs/hypotension, no acei/arb/arni/spiro.  She may not tolerate SGLT2i @ this point.  Will cont to look for opportunities to improve GDMT.  3.  Bipolar Disorder:  Flat affect.  Nightly haldol.  Per IM.  4.  WCT/NSVT:  up to 24 beats NSVT yesterday afternoon.  Cont amio and low dose ? blocker as tolerate.  Signed, Nicolasa Ducking, NP  07/09/2021, 10:56 AM    For questions or updates, please contact   Please consult [email or web address] for contact info under Cardiology/STEMI.

## 2021-07-09 NOTE — Plan of Care (Signed)
  Problem: Education: Goal: Knowledge of General Education information will improve Description: Including pain rating scale, medication(s)/side effects and non-pharmacologic comfort measures 07/09/2021 1007 by Ansel Bong, RN Outcome: Progressing 07/09/2021 1007 by Ansel Bong, RN Outcome: Progressing   Problem: Health Behavior/Discharge Planning: Goal: Ability to manage health-related needs will improve 07/09/2021 1007 by Ansel Bong, RN Outcome: Progressing 07/09/2021 1007 by Ansel Bong, RN Outcome: Progressing   Problem: Clinical Measurements: Goal: Ability to maintain clinical measurements within normal limits will improve 07/09/2021 1007 by Ansel Bong, RN Outcome: Progressing 07/09/2021 1007 by Ansel Bong, RN Outcome: Progressing Goal: Will remain free from infection 07/09/2021 1007 by Ansel Bong, RN Outcome: Progressing 07/09/2021 1007 by Ansel Bong, RN Outcome: Progressing Goal: Diagnostic test results will improve 07/09/2021 1007 by Ansel Bong, RN Outcome: Progressing 07/09/2021 1007 by Ansel Bong, RN Outcome: Progressing Goal: Respiratory complications will improve 07/09/2021 1007 by Ansel Bong, RN Outcome: Progressing 07/09/2021 1007 by Ansel Bong, RN Outcome: Progressing Goal: Cardiovascular complication will be avoided 07/09/2021 1007 by Ansel Bong, RN Outcome: Progressing 07/09/2021 1007 by Ansel Bong, RN Outcome: Progressing   Problem: Activity: Goal: Risk for activity intolerance will decrease 07/09/2021 1007 by Ansel Bong, RN Outcome: Progressing 07/09/2021 1007 by Ansel Bong, RN Outcome: Progressing   Problem: Nutrition: Goal: Adequate nutrition will be maintained 07/09/2021 1007 by Ansel Bong, RN Outcome: Progressing 07/09/2021 1007 by Ansel Bong, RN Outcome: Progressing   Problem: Coping: Goal: Level of anxiety will decrease 07/09/2021 1007 by Ansel Bong, RN Outcome:  Progressing 07/09/2021 1007 by Ansel Bong, RN Outcome: Progressing   Problem: Elimination: Goal: Will not experience complications related to bowel motility 07/09/2021 1007 by Ansel Bong, RN Outcome: Progressing 07/09/2021 1007 by Ansel Bong, RN Outcome: Progressing Goal: Will not experience complications related to urinary retention 07/09/2021 1007 by Ansel Bong, RN Outcome: Progressing 07/09/2021 1007 by Ansel Bong, RN Outcome: Progressing   Problem: Pain Managment: Goal: General experience of comfort will improve 07/09/2021 1007 by Ansel Bong, RN Outcome: Progressing 07/09/2021 1007 by Ansel Bong, RN Outcome: Progressing   Problem: Safety: Goal: Ability to remain free from injury will improve 07/09/2021 1007 by Ansel Bong, RN Outcome: Progressing 07/09/2021 1007 by Ansel Bong, RN Outcome: Progressing   Problem: Skin Integrity: Goal: Risk for impaired skin integrity will decrease 07/09/2021 1007 by Ansel Bong, RN Outcome: Progressing 07/09/2021 1007 by Ansel Bong, RN Outcome: Progressing

## 2021-07-10 ENCOUNTER — Encounter: Payer: Self-pay | Admitting: Cardiovascular Disease

## 2021-07-10 DIAGNOSIS — I50810 Right heart failure, unspecified: Secondary | ICD-10-CM | POA: Diagnosis not present

## 2021-07-10 DIAGNOSIS — I2609 Other pulmonary embolism with acute cor pulmonale: Secondary | ICD-10-CM | POA: Diagnosis not present

## 2021-07-10 DIAGNOSIS — I429 Cardiomyopathy, unspecified: Secondary | ICD-10-CM

## 2021-07-10 DIAGNOSIS — I428 Other cardiomyopathies: Secondary | ICD-10-CM

## 2021-07-10 DIAGNOSIS — I5043 Acute on chronic combined systolic (congestive) and diastolic (congestive) heart failure: Secondary | ICD-10-CM | POA: Diagnosis not present

## 2021-07-10 DIAGNOSIS — E1165 Type 2 diabetes mellitus with hyperglycemia: Secondary | ICD-10-CM | POA: Diagnosis not present

## 2021-07-10 LAB — CBC
HCT: 34.8 % — ABNORMAL LOW (ref 36.0–46.0)
Hemoglobin: 11.5 g/dL — ABNORMAL LOW (ref 12.0–15.0)
MCH: 29.8 pg (ref 26.0–34.0)
MCHC: 33 g/dL (ref 30.0–36.0)
MCV: 90.2 fL (ref 80.0–100.0)
Platelets: 354 10*3/uL (ref 150–400)
RBC: 3.86 MIL/uL — ABNORMAL LOW (ref 3.87–5.11)
RDW: 14.5 % (ref 11.5–15.5)
WBC: 18 10*3/uL — ABNORMAL HIGH (ref 4.0–10.5)
nRBC: 0 % (ref 0.0–0.2)

## 2021-07-10 LAB — BASIC METABOLIC PANEL
Anion gap: 10 (ref 5–15)
BUN: 13 mg/dL (ref 6–20)
CO2: 28 mmol/L (ref 22–32)
Calcium: 8.5 mg/dL — ABNORMAL LOW (ref 8.9–10.3)
Chloride: 98 mmol/L (ref 98–111)
Creatinine, Ser: 0.96 mg/dL (ref 0.44–1.00)
GFR, Estimated: >60 mL/min (ref >60)
Glucose, Bld: 237 mg/dL — ABNORMAL HIGH (ref 70–99)
Potassium: 4 mmol/L (ref 3.5–5.1)
Sodium: 136 mmol/L (ref 135–145)

## 2021-07-10 LAB — HEPARIN LEVEL (UNFRACTIONATED)
Heparin Unfractionated: 0.16 IU/mL — ABNORMAL LOW (ref 0.30–0.70)
Heparin Unfractionated: 0.38 IU/mL (ref 0.30–0.70)
Heparin Unfractionated: 0.16 IU/mL — ABNORMAL LOW (ref 0.30–0.70)

## 2021-07-10 LAB — GLUCOSE, CAPILLARY
Glucose-Capillary: 213 mg/dL — ABNORMAL HIGH (ref 70–99)
Glucose-Capillary: 253 mg/dL — ABNORMAL HIGH (ref 70–99)
Glucose-Capillary: 193 mg/dL — ABNORMAL HIGH (ref 70–99)
Glucose-Capillary: 196 mg/dL — ABNORMAL HIGH (ref 70–99)

## 2021-07-10 LAB — MAGNESIUM: Magnesium: 1.7 mg/dL (ref 1.7–2.4)

## 2021-07-10 LAB — PHOSPHORUS: Phosphorus: 3.8 mg/dL (ref 2.5–4.6)

## 2021-07-10 MED ORDER — MIDODRINE HCL 5 MG PO TABS
10.0000 mg | ORAL_TABLET | Freq: Three times a day (TID) | ORAL | Status: DC
  Administered 2021-07-10 – 2021-07-12 (×10): 10 mg via ORAL
  Filled 2021-07-10 (×10): qty 2

## 2021-07-10 MED ORDER — DIGOXIN 250 MCG PO TABS
0.2500 mg | ORAL_TABLET | Freq: Every day | ORAL | Status: DC
  Administered 2021-07-10 – 2021-07-12 (×3): 0.25 mg via ORAL
  Filled 2021-07-10 (×3): qty 1

## 2021-07-10 MED ORDER — APIXABAN 5 MG PO TABS
10.0000 mg | ORAL_TABLET | Freq: Two times a day (BID) | ORAL | Status: DC
  Administered 2021-07-11 – 2021-07-12 (×3): 10 mg via ORAL
  Filled 2021-07-10 (×3): qty 2

## 2021-07-10 MED ORDER — HEPARIN BOLUS VIA INFUSION
2300.0000 [IU] | Freq: Once | INTRAVENOUS | Status: AC
  Administered 2021-07-10: 2300 [IU] via INTRAVENOUS
  Filled 2021-07-10: qty 2300

## 2021-07-10 MED ORDER — HEPARIN BOLUS VIA INFUSION
2400.0000 [IU] | Freq: Once | INTRAVENOUS | Status: AC
  Administered 2021-07-10: 2400 [IU] via INTRAVENOUS
  Filled 2021-07-10: qty 2400

## 2021-07-10 MED ORDER — IVABRADINE HCL 5 MG PO TABS
5.0000 mg | ORAL_TABLET | Freq: Two times a day (BID) | ORAL | Status: DC
  Administered 2021-07-10 – 2021-07-12 (×6): 5 mg via ORAL
  Filled 2021-07-10 (×8): qty 1

## 2021-07-10 MED ORDER — APIXABAN 5 MG PO TABS: 5.0000 mg | ORAL_TABLET | Freq: Two times a day (BID) | ORAL | Status: DC

## 2021-07-10 NOTE — Consult Note (Signed)
Heart Failure Nurse Navigator Note  HFrEF <20%.  Right ventricular systolic function is severely decreased.  Moderate biatrial enlargement.  Moderate mitral regurgitation.  Moderate tricuspid regurgitation.  She presented to the emergency room with complaints of chest pain.  Testing revealed pulmonary embolism.  Cardiac catheterization revealed normal coronaries and a normal left ventricular end-diastolic pressure.  During this hospitalization she is also been noted to have nonsustained ventricular tachycardia.  Comorbidities:  Asthma Type 2 diabetes Hypertension   Medications:  Amiodarone 200 mg daily Atorvastatin 20 mg daily Digoxin 0.25 mg daily Heparin infusion Ivabradine 5 mg 2 times a day with meals Midodrine 10 mg 3 times a day with meals Potassium chloride 20 mEq daily  Labs:  Sodium 136, potassium 4.0, chloride 98, CO2 28, BUN 13, creatinine 0.96, magnesium 1.7, phosphorus 3.8, Weight is 96 kg, down from 103.7 kg, today's weight was originally documented at 103.4 kg but she was weighed in the bed and the repeat of 96 kg was done as a standing weight. Intake 2446 mL Output 7100 mL   Assessment:  General-she is awake and alert lying in bed watching TV.  HEENT-pupils are equal, normal cephalic, no JVD noted.  Cardiac-heart tones are tachycardic and regular.  Chest-breath sounds are clear to posterior auscultation.  Abdomen-soft nontender rounded.  Lower extremities-no edema noted.   Initial meeting with patient today.  She could tell me that the cardiac catheterization revealed she had normal coronaries she knows by echocardiogram that there has been a decline in the squeeze of her heart, she scribed it is being weekend.  Discussed the importance of low-sodium diet,not  using salt at the table, weighing daily and restricting fluid intake.  She voices understanding.  States that she lives with her daughter and grandchildren.  She has an appointment on August 2  at 1:30 in the afternoon with Clarisa Kindred in the outpatient heart failure clinic.  Is also discussed in progression  meeting this morning that patient to be fitted at time of discharge with LifeVest. Per Dr. Marylu Lund.  She was given the living with heart failure teaching booklet, sodium information along with taking charge of your heart failure.  Will continue to follow along.  Tresa Endo RN [facility]

## 2021-07-10 NOTE — Consult Note (Signed)
ANTICOAGULATION CONSULT NOTE  Pharmacy Consult for Heparin infusion Indication: pulmonary embolus  Patient Measurements: Heparin Dosing Weight: 79 kg   Labs: Recent Labs    07/08/21 0505 07/08/21 1156 07/08/21 1830 07/09/21 0036 07/09/21 0412 07/10/21 0120  HGB 11.2*  --   --   --  10.5* 11.5*  HCT 33.7*  --   --   --  31.9* 34.8*  PLT 347  --   --   --  350 354  HEPARINUNFRC 0.71*   < > 0.45 0.48  --  0.16*  CREATININE 0.65  --   --   --  0.75 0.96   < > = values in this interval not displayed.    Estimated Creatinine Clearance: 82.1 mL/min (by C-G formula based on SCr of 0.96 mg/dL).   Medical History: Past Medical History:  Diagnosis Date   Asthma    CAD (coronary artery disease)    Diabetes (HCC)    Hypertension    Medications:  No prior anticoagulation noted  Assessment: 50 y/o female with Hx of recent MI, presents with chest pain. CT positive for RLL PE without evidence of right heart strain. Pharmacy has been consulted for initiation and management of heparin infusion for PE.  7/19-20: [facility] for isch w/u. (Pending result; Cards consulted)  Goal of Therapy:  Heparin level 0.3-0.7 units/ml Monitor platelets by anticoagulation protocol: Yes  7/15 0546 HL 0.21, subtherapeutic - 1350 units/hr 7/15 1305 HL 0.22, subtherapeutic - 1500 units/hr 7/15 2109 HL 0.22, subtherapeutic - 1650 units/hr 7/16 0725 HL <0.10, subtherapeutic -inc to 2100 units/hr 7/16 2250 HL 0.91, supratherapeutic dec to 1950 units/hr 7/17 0540 HL 0.97 supratherapeutic  dec to 1800 units/hr 7/17 1255 HL 0.90 Supratherapeutic       Recheck level w/ periph stick  (unable to get periph stick w/ 4 attempts.) Will decrease drip rate to 1650 units/hr 7/18 0012 HL 0.45, therapeutic 7/18 0505 HL 0.71, supratherapeutic; 1650>1550 un/hr 7/18 1156 HL 0.56, therapeutic  7/18 1830 HL 0.45, therapeutic 7/19 0036 HL 0.48, therapeutic X 3 7/20 0120 HL 0.16, subtherapeutic 1550 > 1800 un/hr 7/20 0950 HL  0.38, Therapeutic; 1800 un/hr    Plan:  Heparin level 0.38 therapeutic x1 at new rate.  Continue heparin drip rate at 1800 units/hr.  Recheck a HL at 6hrs to confirm and if consecutively therapeutic then daily thereafter. CTM daily CBC's while on hep gtt.   Martyn Malay, PharmD, [facility] Clinical Pharmacist 07/10/2021 7:33 AM

## 2021-07-10 NOTE — Progress Notes (Signed)
PROGRESS NOTE    Martha Ramos  FVC:[id number] DOB: Nov 14, 1971 DOA: 07/04/2021 PCP: Darnelle Spangle, MD   Chief complaint.  Chest pain. Brief Narrative:  Martha Ramos is a 50 y.o. African American female with medical history significant for asthma, type 2 diabetes mellitus, hypertension and coronary artery disease, who presented to the emergency room with acute onset of midsternal chest pain with associated dyspnea as well as cough occasionally productive of clear sputum and occasional wheezing. Upon arriving in the hospital, patient had a severe tachycardia, with heart rate in the 120s, leukocytosis of 20.7, lactic acidosis of 3.2.  CT angiogram of the chest showed a right lower lobe pulmonary emboli.  Mild cardiomegaly with evidence of right heart dysfunction.  Small bilateral pleural effusion with basilar atelectasis.  BNP elevated at 1128.3.  Troponin 101.  Patient was placed on IV Lasix.  She is also treated with Rocephin and Zithromax. She does not seem to have infection, antibiotic discontinued.  Patient is seen by cardiology, echocardiogram showed ejection fraction less than 20%, she appears in cardiogenic shock.  She is also placed on heparin drip for PE.  7/20 patient kept her eyes closed throughout the exam.  She would answer questions in yes no but with her eyes closed.  States she is doing fine.  No chest pain.  Shortness of breath feels better.  Assessment & Plan:   Active Problems:   Acute pulmonary embolism (HCC)   Uncontrolled type 2 diabetes mellitus with hyperglycemia (HCC)   Acute on chronic combined systolic (congestive) and diastolic (congestive) heart failure (HCC)   Cardiogenic shock (HCC)   RVF (right ventricular failure) (HCC)   Cardiomyopathy (HCC)  #1.  Acute right lower lobe pulmonary emboli. Elevated troponin secondary to PE. 7/20 on room air now.  Although I saw her on nasal cannula. Continue heparin gtt.will switch to po a/c in am since completed  cardiac cath   2.  Acute on chronic systolic congestive heart failure. Right-sided congestive heart failure. Cardiogenic shock. Nonsustained ventricular tachycardia. Sinus tachycardia Echocardiogram showed ejection fraction less than 20%.   also received a total of 50 g albumin  for hypotension. 7/20-NSVT due to LV dysfunction Cardiology following Sinus tachy likely due to chf S/p cardiac cath with no evidence of CAD Continue on amiodarone Continue midodrine Holding beta-blockers due to hypotension Added ivabradine today    #3.  Uncontrolled type 2 diabetes with hyperglycemia. BG variable Continue R-ISS   4.  Lactic acidosis. Likely secondary to metformin, no evidence of bacterial infection      DVT prophylaxis: Heparin Code Status: full Family Communication: None at bedside Disposition Plan:    Status is: Inpatient  Remains inpatient appropriate because:Ongoing diagnostic testing needed not appropriate for outpatient work up, IV treatments appropriate due to intensity of illness or inability to take PO, and Inpatient level of care appropriate due to severity of illness  Dispo: The patient is from: Home              Anticipated d/c is to: Home              Patient currently is not medically stable to d/c.   Difficult to place patient No        I/O last 3 completed shifts: In: 3175.4 [I.V.:2925.2; IV Piggyback:250.2] Out: 7100 [Urine:7100] Total I/O In: 600 [P.O.:600] Out: 200 [Urine:200]     Consultants:  Card  Procedures: Heart cath-no CAD found  Antimicrobials: None  Subjective: Eyes closed but reports no shortness  of breath or chest pain  Objective: Vitals:   07/10/21 1027 07/10/21 1029 07/10/21 1117 07/10/21 1550  BP:   94/79 111/75  Pulse:  92 62 (!) 57  Resp:   18 18  Temp:   98.1 F (36.7 C) 98.3 F (36.8 C)  TempSrc:    Oral  SpO2:   96% 99%  Weight: 96 kg     Height:        Intake/Output Summary (Last 24 hours) at  07/10/2021 1619 Last data filed at 07/10/2021 1407 Gross per 24 hour  Intake 3046.84 ml  Output 2000 ml  Net 1046.84 ml   Filed Weights   07/08/21 0500 07/10/21 0428 07/10/21 1027  Weight: 103.7 kg 103.4 kg 96 kg    Examination:  Calm, NAD, keeps eyes closed throughout my exam  decreased breath sounds, no wheezing Regular, S1-S2 no gallops Soft benign positive bowel sounds Mild edema lower extremity Mood and affect appropriate in current setting    Data Reviewed: I have personally reviewed following labs and imaging studies  CBC: Recent Labs  Lab 07/04/21 1942 07/05/21 0546 07/06/21 0725 07/07/21 0540 07/08/21 0505 07/09/21 0412 07/10/21 0120  WBC 20.7*   < > 15.3* 13.7* 12.2* 10.1 18.0*  NEUTROABS 17.6*  --  11.8* 9.8*  --   --   --   HGB 12.3   < > 12.0 11.1* 11.2* 10.5* 11.5*  HCT 39.0   < > 36.9 34.5* 33.7* 31.9* 34.8*  MCV 92.2   < > 91.1 92.7 89.2 89.9 90.2  PLT 372   < > 320 338 347 350 354   < > = values in this interval not displayed.   Basic Metabolic Panel: Recent Labs  Lab 07/07/21 0540 07/07/21 1507 07/08/21 0505 07/09/21 0412 07/10/21 0120  NA 135 137 138 138 136  K 4.5 3.8 3.8 3.7 4.0  CL 101 104 103 99 98  CO2 24 28 30 30 28   GLUCOSE 249* 111* 86 102* 237*  BUN 14 14 12 13 13   CREATININE 0.69 0.76 0.65 0.75 0.96  CALCIUM 8.8* 8.8* 8.7* 8.7* 8.5*  MG 1.9 1.7 1.7 1.8 1.7  PHOS  --   --  3.8 4.0 3.8   GFR: Estimated Creatinine Clearance: 78.8 mL/min (by C-G formula based on SCr of 0.96 mg/dL). Liver Function Tests: Recent Labs  Lab 07/04/21 1942 07/05/21 0546  AST 39 20  ALT 49* 40  ALKPHOS 65 58  BILITOT 0.8 0.8  PROT 6.6 6.3*  ALBUMIN 3.3* 3.0*   Recent Labs  Lab 07/04/21 1942  LIPASE 34   No results for input(s): AMMONIA in the last 168 hours. Coagulation Profile: Recent Labs  Lab 07/04/21 1942  INR 1.1   Cardiac Enzymes: No results for input(s): CKTOTAL, CKMB, CKMBINDEX, TROPONINI in the last 168 hours. BNP (last  3 results) No results for input(s): PROBNP in the last 8760 hours. HbA1C: No results for input(s): HGBA1C in the last 72 hours. CBG: Recent Labs  Lab 07/09/21 1710 07/09/21 2020 07/10/21 0740 07/10/21 1128 07/10/21 1551  GLUCAP 185* 290* 213* 253* 193*   Lipid Profile: No results for input(s): CHOL, HDL, LDLCALC, TRIG, CHOLHDL, LDLDIRECT in the last 72 hours. Thyroid Function Tests: No results for input(s): TSH, T4TOTAL, FREET4, T3FREE, THYROIDAB in the last 72 hours. Anemia Panel: No results for input(s): VITAMINB12, FOLATE, FERRITIN, TIBC, IRON, RETICCTPCT in the last 72 hours. Sepsis Labs: Recent Labs  Lab 07/04/21 1942 07/05/21 0037 07/05/21 8144 07/06/21 8185  PROCALCITON  --   --  0.23  --   LATICACIDVEN 3.2* 2.6*  --  1.9    Recent Results (from the past 240 hour(s))  Resp Panel by RT-PCR (Flu A&B, Covid) Nasopharyngeal Swab     Status: None   Collection Time: 07/04/21  9:38 PM   Specimen: Nasopharyngeal Swab; Nasopharyngeal(NP) swabs in vial transport medium  Result Value Ref Range Status   SARS Coronavirus 2 by RT PCR NEGATIVE NEGATIVE Final    Comment: (NOTE) SARS-CoV-2 target nucleic acids are NOT DETECTED.  The SARS-CoV-2 RNA is generally detectable in upper respiratory specimens during the acute phase of infection. The lowest concentration of SARS-CoV-2 viral copies this assay can detect is 138 copies/mL. A negative result does not preclude SARS-Cov-2 infection and should not be used as the sole basis for treatment or other patient management decisions. A negative result may occur with  improper specimen collection/handling, submission of specimen other than nasopharyngeal swab, presence of viral mutation(s) within the areas targeted by this assay, and inadequate number of viral copies(<138 copies/mL). A negative result must be combined with clinical observations, patient history, and epidemiological information. The expected result is Negative.  Fact  Sheet for Patients:  BloggerCourse.com  Fact Sheet for Healthcare Providers:  SeriousBroker.it  This test is no t yet approved or cleared by the [place] FDA and  has been authorized for detection and/or diagnosis of SARS-CoV-2 by FDA under an Emergency Use Authorization (EUA). This EUA will remain  in effect (meaning this test can be used) for the duration of the COVID-19 declaration under Section 564(b)(1) of the Act, 21 U.S.C.section 360bbb-3(b)(1), unless the authorization is terminated  or revoked sooner.       Influenza A by PCR NEGATIVE NEGATIVE Final   Influenza B by PCR NEGATIVE NEGATIVE Final    Comment: (NOTE) The Xpert Xpress SARS-CoV-2/FLU/RSV plus assay is intended as an aid in the diagnosis of influenza from Nasopharyngeal swab specimens and should not be used as a sole basis for treatment. Nasal washings and aspirates are unacceptable for Xpert Xpress SARS-CoV-2/FLU/RSV testing.  Fact Sheet for Patients: BloggerCourse.com  Fact Sheet for Healthcare Providers: SeriousBroker.it  This test is not yet approved or cleared by the [place] FDA and has been authorized for detection and/or diagnosis of SARS-CoV-2 by FDA under an Emergency Use Authorization (EUA). This EUA will remain in effect (meaning this test can be used) for the duration of the COVID-19 declaration under Section 564(b)(1) of the Act, 21 U.S.C. section 360bbb-3(b)(1), unless the authorization is terminated or revoked.  Performed at [facility], [address] [place], [place] [address]   Culture, blood (routine x 2)     Status: None   Collection Time: 07/04/21 10:01 PM   Specimen: BLOOD  Result Value Ref Range Status   Specimen Description BLOOD BLOOD RIGHT HAND  Final   Special Requests   Final    BOTTLES DRAWN AEROBIC AND ANAEROBIC Blood Culture adequate volume   Culture    Final    NO GROWTH 5 DAYS Performed at [facility], [address] [place], [place] [address]    Report Status 07/09/2021 FINAL  Final  Culture, blood (routine x 2)     Status: None   Collection Time: 07/04/21 10:01 PM   Specimen: BLOOD  Result Value Ref Range Status   Specimen Description BLOOD RIGHT ANTECUBITAL  Final   Special Requests   Final    BOTTLES DRAWN AEROBIC AND ANAEROBIC  Blood Culture adequate volume   Culture   Final    NO GROWTH 5 DAYS Performed at [facility], [address] [place], [place] [address]    Report Status 07/09/2021 FINAL  Final  Urine Culture     Status: None   Collection Time: 07/04/21 11:30 PM   Specimen: Urine, Catheterized  Result Value Ref Range Status   Specimen Description   Final    URINE, CATHETERIZED Performed at [facility], [address] [place], [place] [address]    Special Requests   Final    NONE Performed at [facility], [address] [place], [place] [address]    Culture   Final    NO GROWTH Performed at [facility], [address] [place], [place] [address]    Report Status 07/06/2021 FINAL  Final  MRSA Next Gen by PCR, Nasal     Status: None   Collection Time: 07/05/21  2:15 AM   Specimen: Nasal Mucosa; Nasal Swab  Result Value Ref Range Status   MRSA by PCR Next Gen NOT DETECTED NOT DETECTED Final    Comment: (NOTE) The GeneXpert MRSA Assay (FDA approved for NASAL specimens only), is one component of a comprehensive MRSA colonization surveillance program. It is not intended to diagnose MRSA infection nor to guide or monitor treatment for MRSA infections. Test performance is not FDA approved in patients less than 21 years old. Performed at [facility], [address] [place], [place] [address]          Radiology Studies: CARDIAC CATHETERIZATION  Result Date: 07/09/2021 Formatting of this result is different from the original.   LV end  diastolic pressure is normal. Angiographically normal coronary arteries Normal LVEDP Systemic hypotension Pt given 500 cc 0.9% NS in the cath lab for treatment of hypotension. Resume heparin for treatment of PE 2 hours after TR band off. OK to transition to DOAC tomorrow if no bleeding complications arise.        Scheduled Meds:  amiodarone  200 mg Oral Daily   atorvastatin  20 mg Oral Daily   Chlorhexidine Gluconate Cloth  6 each Topical Q0600   digoxin  0.25 mg Oral Daily   DULoxetine  60 mg Oral Daily   haloperidol  10 mg Oral QHS   insulin aspart  0-15 Units Subcutaneous TID AC & HS   ivabradine  5 mg Oral BID WC   mouth rinse  15 mL Mouth Rinse BID   midodrine  10 mg Oral TID WC   pantoprazole  40 mg Oral Daily   potassium chloride SA  20 mEq Oral Daily   potassium phosphate (monobasic)  500 mg Oral BID   sodium chloride flush  3 mL Intravenous Q12H   sodium chloride flush  3 mL Intravenous Q12H   Continuous Infusions:  sodium chloride     heparin 1,800 Units/hr (07/10/21 0732)     LOS: 6 days    Time spent: 35 minutes with more than 50% on COC    Lynn Ito, MD [facility]   To contact the attending provider between 7A-7P or the covering provider during after hours 7P-7A, please log into the web site [email or web address] and access using universal [place] password for that web site. If you do not have the password, please call the hospital operator.  07/10/2021, 4:19 PM

## 2021-07-10 NOTE — Progress Notes (Addendum)
Inpatient Diabetes Program Recommendations  AACE/ADA: New Consensus Statement on Inpatient Glycemic Control   Target Ranges:  Prepandial:   less than 140 mg/dL      Peak postprandial:   less than 180 mg/dL (1-2 hours)      Critically ill patients:  140 - 180 mg/dL  Results for JANNA, OAK (MRN [id number]) as of 07/10/2021 07:08  Ref. Range 07/10/2021 01:20  Glucose Latest Ref Range: 70 - 99 mg/dL 659 (H)   Results for KEYARAH, MCROY (MRN [id number]) as of 07/10/2021 07:08  Ref. Range 07/09/2021 07:25 07/09/2021 11:26 07/09/2021 13:44 07/09/2021 14:00 07/09/2021 15:02 07/09/2021 17:10 07/09/2021 20:20  Glucose-Capillary Latest Ref Range: 70 - 99 mg/dL 390 (H) 76 56 (L) 300 (H) 102 (H) 185 (H) 290 (H)    Review of Glycemic Control  Current orders for Inpatient glycemic control: Novolog 0-15 units TID with meals and HS  Inpatient Diabetes Program Recommendations:    Insulin: Patient received Lantus 20 units on 07/08/21 and glucose down to 56 mg/dl on 09/13/29. Please consider ordering Lantus 10 units daily.  Thanks, Orlando Penner, RN, MSN, CDE Diabetes Coordinator Inpatient Diabetes Program [phone] (Team Pager from 8am to 5pm)

## 2021-07-10 NOTE — Progress Notes (Signed)
Progress Note  Patient Name: Martha Ramos Date of Encounter: 07/10/2021  Primary Cardiologist: Julien Nordmann, MD  Subjective   More conversant this AM.  No chest pain or sob.  R wrist feels well.  Inpatient Medications    Scheduled Meds:  amiodarone  200 mg Oral Daily   atorvastatin  20 mg Oral Daily   Chlorhexidine Gluconate Cloth  6 each Topical Q0600   digoxin  0.25 mg Oral Daily   DULoxetine  60 mg Oral Daily   haloperidol  10 mg Oral QHS   insulin aspart  0-15 Units Subcutaneous TID AC & HS   ivabradine  5 mg Oral BID WC   mouth rinse  15 mL Mouth Rinse BID   midodrine  10 mg Oral TID WC   pantoprazole  40 mg Oral Daily   potassium chloride SA  20 mEq Oral Daily   potassium phosphate (monobasic)  500 mg Oral BID   sodium chloride flush  3 mL Intravenous Q12H   sodium chloride flush  3 mL Intravenous Q12H   Continuous Infusions:  sodium chloride     heparin 1,800 Units/hr (07/10/21 0732)   sodium chloride     PRN Meds: sodium chloride, acetaminophen **OR** acetaminophen, albuterol, magnesium hydroxide, morphine injection, ondansetron **OR** ondansetron (ZOFRAN) IV, sodium chloride flush, traZODone   Vital Signs    Vitals:   07/10/21 0738 07/10/21 1027 07/10/21 1029 07/10/21 1117  BP: (!) 87/75   94/79  Pulse: 65  92 62  Resp: 18   18  Temp: 98.1 F (36.7 C)   98.1 F (36.7 C)  TempSrc: Oral     SpO2: 95%   96%  Weight:  96 kg    Height:        Intake/Output Summary (Last 24 hours) at 07/10/2021 1134 Last data filed at 07/10/2021 1006 Gross per 24 hour  Intake 2806.84 ml  Output 3200 ml  Net -393.16 ml   Filed Weights   07/08/21 0500 07/10/21 0428 07/10/21 1027  Weight: 103.7 kg 103.4 kg 96 kg    Physical Exam   GEN: Obese, in no acute distress.  HEENT: Grossly normal.  Neck: Supple obese, difficult to gauge JVP, no masses. Cardiac: RRR, tachy, + S4, 2/6 syst murmur @ LLSB. No clubbing, cyanosis, edema.  R radial cath site w/o  bleeding/bruit/hematoma.  Radials 2+, DP/PT 2+ and equal bilaterally.  Respiratory:  Respirations regular and unlabored, clear to auscultation bilaterally. GI: Soft, nontender, nondistended, BS + x 4. MS: no deformity or atrophy. Skin: warm and dry, no rash. Neuro:  Strength and sensation are intact. Psych: AAOx3.  Flat affect.  Labs    Chemistry Recent Labs  Lab 07/04/21 1942 07/05/21 0546 07/06/21 0725 07/08/21 0505 07/09/21 0412 07/10/21 0120  NA 136 137   < > 138 138 136  K 3.9 4.1   < > 3.8 3.7 4.0  CL 103 106   < > 103 99 98  CO2 22 24   < > 30 30 28   GLUCOSE 339* 266*   < > 86 102* 237*  BUN 13 12   < > 12 13 13   CREATININE 0.87 0.64   < > 0.65 0.75 0.96  CALCIUM 8.9 8.6*   < > 8.7* 8.7* 8.5*  PROT 6.6 6.3*  --   --   --   --   ALBUMIN 3.3* 3.0*  --   --   --   --   AST 39 20  --   --   --   --  ALT 49* 40  --   --   --   --   ALKPHOS 65 58  --   --   --   --   BILITOT 0.8 0.8  --   --   --   --   GFRNONAA >60 >60   < > >60 >60 >60  ANIONGAP 11 7   < > 5 9 10    < > = values in this interval not displayed.     Hematology Recent Labs  Lab 07/08/21 0505 07/09/21 0412 07/10/21 0120  WBC 12.2* 10.1 18.0*  RBC 3.78* 3.55* 3.86*  HGB 11.2* 10.5* 11.5*  HCT 33.7* 31.9* 34.8*  MCV 89.2 89.9 90.2  MCH 29.6 29.6 29.8  MCHC 33.2 32.9 33.0  RDW 14.5 14.2 14.5  PLT 347 350 354    Cardiac Enzymes  Recent Labs  Lab 07/04/21 1942 07/04/21 2138  TROPONINIHS 101* 92*      BNP Recent Labs  Lab 07/04/21 1942 07/05/21 0546 07/06/21 0837  BNP 1,128.3* 1,356.8* 1,430.3*    HbA1c  Lab Results  Component Value Date   HGBA1C 6.2 (H) 07/06/2021    Radiology    DG Chest Port 1 View  Result Date: 07/06/2021 CLINICAL DATA:  Check line placement EXAM: PORTABLE CHEST 1 VIEW COMPARISON:  July 04, 2021 FINDINGS: The new right central line terminates just in the right side of the atrium, approximately 2 cm above the caval atrial junction. No pneumothorax. No other  changes. IMPRESSION: The new central line terminates 2 cm distal to the caval atrial junction. Recommend withdrawing 2 cm. No pneumothorax. No other change. These results will be called to the ordering clinician or representative by the Radiologist Assistant, and communication documented in the PACS or July 06, 2021. Electronically Signed   By: [employer] M.D   On: 07/06/2021 16:00   07/08/2021 EKG SITE RITE  Result Date: 07/06/2021 If Site Rite image not attached, placement could not be confirmed due to current cardiac rhythm.   Telemetry    Tachycardia - low 100's to 120's - appears to be sinus - 12 lead ECG pending  - Personally Reviewed  Cardiac Studies   2D Echocardiogram 7.15.2022  Echo  1. Severely dilated systolic cardiomyopathy.   2. Left ventricular ejection fraction, by estimation, is <20%. The left  ventricle has severely decreased function. The left ventricle has no  regional wall motion abnormalities. The left ventricular internal cavity  size was severely dilated. Left  ventricular diastolic parameters were normal.   3. Right ventricular systolic function is severely reduced. The right  ventricular size is moderately enlarged.   4. Left atrial size was moderately dilated.   5. Right atrial size was moderately dilated.   6. The mitral valve is grossly normal. Moderate mitral valve  regurgitation.   7. Tricuspid valve regurgitation is moderate.   8. The aortic valve is grossly normal. Aortic valve regurgitation is not  visualized. _____________  Cardiac Catheterization  7.19.2022  Left Main  Vessel is angiographically normal.  Left Circumflex  Vessel is angiographically normal.  Right Coronary Artery  Vessel is angiographically normal.  LVEDP = 10.  Patient Profile     Echo  1. Severely dilated systolic cardiomyopathy.   2. Left ventricular ejection fraction, by estimation, is <20%. The left  ventricle has severely decreased function. The left ventricle  has no  regional wall motion abnormalities. The left ventricular internal cavity  size was severely dilated. Left  ventricular diastolic  parameters were normal.   3. Right ventricular systolic function is severely reduced. The right  ventricular size is moderately enlarged.   4. Left atrial size was moderately dilated.   5. Right atrial size was moderately dilated.   6. The mitral valve is grossly normal. Moderate mitral valve  regurgitation.   7. Tricuspid valve regurgitation is moderate.   8. The aortic valve is grossly normal. Aortic valve regurgitation is not  visualized.  Assessment & Plan    1.  Acute PE/RV railure:  Admitted 7/14 w/ dyspnea and shock in the setting of RLL PE.  Echo w/ sev reduced RV fxn.  Euvolemic.  Plan to transition to PO DOAC today.  2.  Acute systolic/diastolic CHF/Cardiogenic shock/NICM:  EF <20% by echo this admission, prev nl in 03/2020. Cath 7/19 w/ nl cors and nl LVEDP.  In setting of hypotension, we have had to d/c ? blocker and she has not been a suitable candidate for acei/arb/arni/spiro.  Hesitant to add SGLT2i as she'd be likely to develop orthostasis.    3.  Hypotension:  cont midodrine.  D/c ? blocker as pressures cont to trend 80's to low 100's.  4.  WCT/NSVT:  quiescent on amio.  5.  Sinus Tachycardia:  Pt w/ sinus tachy in low 100's to 120's.  She does sometimes have abrupt rises and drops in HR from low 100's to 120's and then back.  She has been more or less dialed in between 124 and 128 since last night. 12 lead ECG pending this AM.  ? Atrial tachycardia.  As above, having to hold ? blocker in setting of hypotension.  Cont amio, digoxin (change to PO). Adding ivabradine today.  6.  Bipolar Disorder: Stable, per IM.  Signed, Nicolasa Ducking, NP  07/10/2021, 11:34 AM    For questions or updates, please contact   Please consult [email or web address] for contact info under Cardiology/STEMI.

## 2021-07-10 NOTE — Consult Note (Signed)
ANTICOAGULATION CONSULT NOTE  Pharmacy Consult for Heparin infusion Indication: pulmonary embolus  Patient Measurements: Heparin Dosing Weight: 79 kg   Labs: Recent Labs    07/07/21 1507 07/08/21 0012 07/08/21 0505 07/08/21 1156 07/08/21 1830 07/09/21 0036 07/09/21 0412 07/10/21 0120  HGB  --   --  11.2*  --   --   --  10.5* 11.5*  HCT  --   --  33.7*  --   --   --  31.9* 34.8*  PLT  --   --  347  --   --   --  350 354  HEPARINUNFRC  --    < > 0.71*   < > 0.45 0.48  --  0.16*  CREATININE 0.76  --  0.65  --   --   --  0.75  --    < > = values in this interval not displayed.    Estimated Creatinine Clearance: 98.7 mL/min (by C-G formula based on SCr of 0.75 mg/dL).   Medical History: Past Medical History:  Diagnosis Date   Asthma    CAD (coronary artery disease)    Diabetes (HCC)    Hypertension    Medications:  No prior anticoagulation noted  Assessment: 50 y/o female with Hx of recent MI, presents with chest pain. CT positive for RLL PE without evidence of right heart strain. Pharmacy has been consulted for initiation and management of heparin infusion for PE.  Goal of Therapy:  Heparin level 0.3-0.7 units/ml Monitor platelets by anticoagulation protocol: Yes  7/15 0546 HL 0.21, subtherapeutic - 1350 units/hr 7/15 1305 HL 0.22, subtherapeutic - 1500 units/hr 7/15 2109 HL 0.22, subtherapeutic - 1650 units/hr 7/16 0725 HL <0.10, subtherapeutic -inc to 2100 units/hr 7/16 2250 HL 0.91, supratherapeutic dec to 1950 units/hr 7/17 0540 HL 0.97 supratherapeutic  dec to 1800 units/hr 7/17 1255 HL 0.90 Supratherapeutic       Recheck level w/ periph stick  (unable to get periph stick w/ 4 attempts.) Will decrease drip rate to 1650 units/hr 7/18 0012 HL 0.45, therapeutic 7/18 0505 HL 0.71, supratherapeutic 7/18 1156 HL 0.56, therapeutic  7/18 1830 HL 0.45, therapeutic 7/19 0036 HL 0.48, therapeutic X 2  7/20 0120 HL 0.16, subtherapeutic     Plan:  7/20:  HL @ 0120  = 0.16 , subtherapeutic  Will order heparin 2400 units IV X 1 bolus and increase drip rate to 1800 units/hr.  Will recheck HL 6 hrs after rate change.   Scherrie Gerlach, PharmD Clinical Pharmacist 07/10/2021 2:03 AM

## 2021-07-10 NOTE — Consult Note (Signed)
ANTICOAGULATION CONSULT NOTE  Pharmacy Consult for heparin infusion >> apixaban Indication: pulmonary embolus  Patient Measurements: Heparin Dosing Weight: 79 kg   Labs: Recent Labs    07/08/21 0505 07/08/21 1156 07/09/21 0412 07/10/21 0120 07/10/21 0950 07/10/21 1608  HGB 11.2*  --  10.5* 11.5*  --   --   HCT 33.7*  --  31.9* 34.8*  --   --   PLT 347  --  350 354  --   --   HEPARINUNFRC 0.71*   < >  --  0.16* 0.38 0.16*  CREATININE 0.65  --  0.75 0.96  --   --    < > = values in this interval not displayed.    Estimated Creatinine Clearance: 78.8 mL/min (by C-G formula based on SCr of 0.96 mg/dL).   Medical History: Past Medical History:  Diagnosis Date   Asthma    CAD (coronary artery disease)    Diabetes (HCC)    Hypertension    Medications:  No prior anticoagulation noted  Assessment: 50 y/o female with Hx of recent MI, presents with chest pain. CT positive for RLL PE without evidence of right heart strain. Pharmacy has been consulted for initiation and management of heparin infusion for PE.  7/19-20: [facility] for isch w/u. (Pending result; Cards consulted)  Goal of Therapy:  Heparin level 0.3-0.7 units/ml Monitor platelets by anticoagulation protocol: Yes  7/15 0546 HL 0.21, subtherapeutic - 1350 units/hr 7/15 1305 HL 0.22, subtherapeutic - 1500 units/hr 7/15 2109 HL 0.22, subtherapeutic - 1650 units/hr 7/16 0725 HL <0.10, subtherapeutic -inc to 2100 units/hr 7/16 2250 HL 0.91, supratherapeutic dec to 1950 units/hr 7/17 0540 HL 0.97 supratherapeutic  dec to 1800 units/hr 7/17 1255 HL 0.90 Supratherapeutic       Recheck level w/ periph stick  (unable to get periph stick w/ 4 attempts.) Will decrease drip rate to 1650 units/hr 7/18 0012 HL 0.45, therapeutic 7/18 0505 HL 0.71, supratherapeutic; 1650>1550 un/hr 7/18 1156 HL 0.56, therapeutic  7/18 1830 HL 0.45, therapeutic 7/19 0036 HL 0.48, therapeutic X 3 7/20 0120 HL 0.16, subtherapeutic 1550 > 1800  un/hr 7/20 0950 HL 0.38, therapeutic; 1800 un/hr 7/20 1608 HL 0.16, subtherapeutic; 1800 un/hr  Plan:   Heparin: --Heparin level is subtherapeutic --Heparin 2300 unit IV bolus x 1 and increase infusion rate to 2100 units/hr --Re-check heparin level 6 hours after rate change --Daily CBC per protocol while on IV heparin  Apixaban: --Per consult instructions, start therapy 7/21 AM --Continue heparin infusion until 7/21 at 1000 --On 7/21 at 1000, start apixaban 10 mg BID x 7 days followed by apixaban 5 mg BID for remaining duration of therapy --CBC at least every 3 days per protocol  [name] 07/10/2021 4:34 PM

## 2021-07-11 DIAGNOSIS — I50810 Right heart failure, unspecified: Secondary | ICD-10-CM | POA: Diagnosis not present

## 2021-07-11 DIAGNOSIS — I2609 Other pulmonary embolism with acute cor pulmonale: Secondary | ICD-10-CM | POA: Diagnosis not present

## 2021-07-11 DIAGNOSIS — I5043 Acute on chronic combined systolic (congestive) and diastolic (congestive) heart failure: Secondary | ICD-10-CM | POA: Diagnosis not present

## 2021-07-11 DIAGNOSIS — E1165 Type 2 diabetes mellitus with hyperglycemia: Secondary | ICD-10-CM | POA: Diagnosis not present

## 2021-07-11 DIAGNOSIS — R570 Cardiogenic shock: Secondary | ICD-10-CM | POA: Diagnosis not present

## 2021-07-11 DIAGNOSIS — I429 Cardiomyopathy, unspecified: Secondary | ICD-10-CM | POA: Diagnosis not present

## 2021-07-11 LAB — GLUCOSE, CAPILLARY
Glucose-Capillary: 168 mg/dL — ABNORMAL HIGH (ref 70–99)
Glucose-Capillary: 285 mg/dL — ABNORMAL HIGH (ref 70–99)
Glucose-Capillary: 115 mg/dL — ABNORMAL HIGH (ref 70–99)
Glucose-Capillary: 192 mg/dL — ABNORMAL HIGH (ref 70–99)
Glucose-Capillary: 176 mg/dL — ABNORMAL HIGH (ref 70–99)
Glucose-Capillary: 164 mg/dL — ABNORMAL HIGH (ref 70–99)

## 2021-07-11 LAB — CBC
HCT: 33.3 % — ABNORMAL LOW (ref 36.0–46.0)
Hemoglobin: 11 g/dL — ABNORMAL LOW (ref 12.0–15.0)
MCH: 29.8 pg (ref 26.0–34.0)
MCHC: 33 g/dL (ref 30.0–36.0)
MCV: 90.2 fL (ref 80.0–100.0)
Platelets: 340 10*3/uL (ref 150–400)
RBC: 3.69 MIL/uL — ABNORMAL LOW (ref 3.87–5.11)
RDW: 14.4 % (ref 11.5–15.5)
WBC: 14.5 10*3/uL — ABNORMAL HIGH (ref 4.0–10.5)
nRBC: 0 % (ref 0.0–0.2)

## 2021-07-11 LAB — PHOSPHORUS: Phosphorus: 3.7 mg/dL (ref 2.5–4.6)

## 2021-07-11 LAB — BASIC METABOLIC PANEL
Anion gap: 14 (ref 5–15)
BUN: 10 mg/dL (ref 6–20)
CO2: 28 mmol/L (ref 22–32)
Calcium: 9.2 mg/dL (ref 8.9–10.3)
Chloride: 96 mmol/L — ABNORMAL LOW (ref 98–111)
Creatinine, Ser: 0.68 mg/dL (ref 0.44–1.00)
GFR, Estimated: >60 mL/min (ref >60)
Glucose, Bld: 233 mg/dL — ABNORMAL HIGH (ref 70–99)
Potassium: 3.7 mmol/L (ref 3.5–5.1)
Sodium: 138 mmol/L (ref 135–145)

## 2021-07-11 LAB — MAGNESIUM: Magnesium: 2.1 mg/dL (ref 1.7–2.4)

## 2021-07-11 LAB — HEPARIN LEVEL (UNFRACTIONATED)
Heparin Unfractionated: 0.55 IU/mL (ref 0.30–0.70)
Heparin Unfractionated: 0.54 IU/mL (ref 0.30–0.70)

## 2021-07-11 MED ORDER — ENSURE MAX PROTEIN PO LIQD
11.0000 [oz_av] | Freq: Two times a day (BID) | ORAL | Status: DC
  Administered 2021-07-11: 11 [oz_av] via ORAL
  Filled 2021-07-11: qty 330

## 2021-07-11 MED ORDER — TORSEMIDE 20 MG PO TABS
20.0000 mg | ORAL_TABLET | Freq: Every day | ORAL | Status: DC
  Administered 2021-07-11 – 2021-07-12 (×2): 20 mg via ORAL
  Filled 2021-07-11 (×2): qty 1

## 2021-07-11 MED ORDER — MAGNESIUM SULFATE 2 GM/50ML IV SOLN
2.0000 g | Freq: Once | INTRAVENOUS | Status: AC
  Administered 2021-07-11: 2 g via INTRAVENOUS
  Filled 2021-07-11: qty 50

## 2021-07-11 MED ORDER — INSULIN GLARGINE 100 UNIT/ML ~~LOC~~ SOLN
5.0000 [IU] | Freq: Every day | SUBCUTANEOUS | Status: DC
  Administered 2021-07-11 – 2021-07-12 (×2): 5 [IU] via SUBCUTANEOUS
  Filled 2021-07-11 (×3): qty 0.05

## 2021-07-11 NOTE — Progress Notes (Signed)
Progress Note  Patient Name: Martha Ramos Date of Encounter: 07/11/2021  [facility] Cardiologist: Julien Nordmann, MD   Subjective   Patient reports some chest pain with inspiration, still feels a little short of breath. First dose of Eliquis 5mg BID is this morning. Bps better this am however heart rates still mildly elevated. Brief runs of NSVT on tele  Inpatient Medications    Scheduled Meds:  amiodarone  200 mg Oral Daily   apixaban  10 mg Oral BID   Followed by   ON 07/18/2021] apixaban  5 mg Oral BID   atorvastatin  20 mg Oral Daily   Chlorhexidine Gluconate Cloth  6 each Topical Q0600   digoxin  0.25 mg Oral Daily   DULoxetine  60 mg Oral Daily   haloperidol  10 mg Oral QHS   insulin aspart  0-15 Units Subcutaneous TID AC & HS   ivabradine  5 mg Oral BID WC   mouth rinse  15 mL Mouth Rinse BID   midodrine  10 mg Oral TID WC   pantoprazole  40 mg Oral Daily   potassium chloride SA  20 mEq Oral Daily   potassium phosphate (monobasic)  500 mg Oral BID   sodium chloride flush  3 mL Intravenous Q12H   sodium chloride flush  3 mL Intravenous Q12H   Continuous Infusions:  sodium chloride     heparin 2,100 Units/hr (07/10/21 2105)   PRN Meds: sodium chloride, acetaminophen **OR** acetaminophen, albuterol, magnesium hydroxide, morphine injection, ondansetron **OR** ondansetron (ZOFRAN) IV, sodium chloride flush, traZODone   Vital Signs    Vitals:   07/10/21 2316 07/11/21 0534 07/11/21 0535 07/11/21 0745  BP: 131/79  114/74 113/81  Pulse: (!) 59 (!) 124 66 95  Resp: 20   17  Temp: 98.1 F (36.7 C)  98.5 F (36.9 C) 97.6 F (36.4 C)  TempSrc:   Oral   SpO2: 100% 95% 98% 97%  Weight:      Height:        Intake/Output Summary (Last 24 hours) at 07/11/2021 0823 Last data filed at 07/11/2021 0300 Gross per 24 hour  Intake 1120.6 ml  Output 900 ml  Net 220.6 ml   Last 3 Weights 07/10/2021 07/10/2021 07/08/2021  Weight (lbs) 211 lb 10.3 oz 227 lb 14.4  oz 228 lb 9.9 oz  Weight (kg) 96 kg 103.375 kg 103.7 kg      Telemetry    ST/SR, HR 90-120, NSVT, longest 7 beats - Personally Reviewed  ECG    No new - Personally Reviewed  Physical Exam   GEN: No acute distress.   Neck: No JVD Cardiac: RRR, no murmurs, rubs, or gallops.  Respiratory: mild crackles GI: Soft, nontender, non-distended  MS: No edema; No deformity. Neuro:  Nonfocal  Psych: Normal affect   Labs    High Sensitivity Troponin:   Recent Labs  Lab 07/04/21 1942 07/04/21 2138  TROPONINIHS 101* 92*      Chemistry Recent Labs  Lab 07/04/21 1942 07/05/21 0546 07/06/21 0725 07/08/21 0505 07/09/21 0412 07/10/21 0120  NA 136 137   < > 138 138 136  K 3.9 4.1   < > 3.8 3.7 4.0  CL 103 106   < > 103 99 98  CO2 22 24   < > 30 30 28   GLUCOSE 339* 266*   < > 86 102* 237*  BUN 13 12   < > 12 13 13   CREATININE 0.87 0.64   < >  0.65 0.75 0.96  CALCIUM 8.9 8.6*   < > 8.7* 8.7* 8.5*  PROT 6.6 6.3*  --   --   --   --   ALBUMIN 3.3* 3.0*  --   --   --   --   AST 39 20  --   --   --   --   ALT 49* 40  --   --   --   --   ALKPHOS 65 58  --   --   --   --   BILITOT 0.8 0.8  --   --   --   --   GFRNONAA >60 >60   < > >60 >60 >60  ANIONGAP 11 7   < > 5 9 10    < > = values in this interval not displayed.     Hematology Recent Labs  Lab 07/08/21 0505 07/09/21 0412 07/10/21 0120  WBC 12.2* 10.1 18.0*  RBC 3.78* 3.55* 3.86*  HGB 11.2* 10.5* 11.5*  HCT 33.7* 31.9* 34.8*  MCV 89.2 89.9 90.2  MCH 29.6 29.6 29.8  MCHC 33.2 32.9 33.0  RDW 14.5 14.2 14.5  PLT 347 350 354    BNP Recent Labs  Lab 07/04/21 1942 07/05/21 0546 07/06/21 0837  BNP 1,128.3* 1,356.8* 1,430.3*     DDimer No results for input(s): DDIMER in the last 168 hours.   Radiology    CARDIAC CATHETERIZATION  Result Date: 07/09/2021 Formatting of this result is different from the original.   LV end diastolic pressure is normal. Angiographically normal coronary arteries Normal LVEDP Systemic  hypotension Pt given 500 cc 0.9% NS in the cath lab for treatment of hypotension. Resume heparin for treatment of PE 2 hours after TR band off. OK to transition to DOAC tomorrow if no bleeding complications arise.    Cardiac Studies   Echo 07/05/21 1. Severely dilated systolic cardiomyopathy.   2. Left ventricular ejection fraction, by estimation, is <20%. The left  ventricle has severely decreased function. The left ventricle has no  regional wall motion abnormalities. The left ventricular internal cavity  size was severely dilated. Left  ventricular diastolic parameters were normal.   3. Right ventricular systolic function is severely reduced. The right  ventricular size is moderately enlarged.   4. Left atrial size was moderately dilated.   5. Right atrial size was moderately dilated.   6. The mitral valve is grossly normal. Moderate mitral valve  regurgitation.   7. Tricuspid valve regurgitation is moderate.   8. The aortic valve is grossly normal. Aortic valve regurgitation is not  visualized.   Cardiac cath 07/09/21    LV end diastolic pressure is normal.   Angiographically normal coronary arteries Normal LVEDP Systemic hypotension   Pt given 500 cc 0.9% NS in the cath lab for treatment of hypotension. Resume heparin for treatment of PE 2 hours after TR band off. OK to transition to DOAC tomorrow if no bleeding complications arise.     Patient Profile     50 y.o. 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   Assessment & Plan    Acute PE/RV failure - admitted 7/14 with dyspnea and shock in the setting of PE - Echo showed reduced EF - IV heparin>Eliquis  Acute systolic/diastolic CHF Cardiogenic shock/NICM  - Echo this admission showed EF<20%. Previous echo 03/2020 showed normal EF - Cath 7/19 showed normal cors and  normal LVEDP - No BB 2/2 hypotension -  midodrine for hypotension - Can consider ACE/ARB/ARNI/SGLT2 in the future if pressures can tolerate - lasix held for hypotension - patient is - UOP overnight - some crackles on exam, consider restarting lasix  Hypotension - continue midodrine 10mg  TID - pressures improved this morning  WCT/NSVT - continue amiodarone 200mg  daily - brief runs on tele  Tachycardia - digoxin 0.25mg  daily. Digoxin level 1 on 7/19 - Ivabradine 5mg  BID - Heart rates improved but mildly elevated   For questions or updates, please contact [facility] Please consult [email or web address] for contact info under        Signed, Hatsue Sime , PA-C  07/11/2021, 8:23 AM

## 2021-07-11 NOTE — Consult Note (Signed)
ANTICOAGULATION CONSULT NOTE  Pharmacy Consult for heparin infusion >> apixaban Indication: pulmonary embolus  Patient Measurements: Heparin Dosing Weight: 79 kg   Labs: Recent Labs    07/08/21 0505 07/08/21 1156 07/09/21 0412 07/10/21 0120 07/10/21 0950 07/10/21 1608 07/10/21 2300  HGB 11.2*  --  10.5* 11.5*  --   --   --   HCT 33.7*  --  31.9* 34.8*  --   --   --   PLT 347  --  350 354  --   --   --   HEPARINUNFRC 0.71*   < >  --  0.16* 0.38 0.16* 0.55  CREATININE 0.65  --  0.75 0.96  --   --   --    < > = values in this interval not displayed.    Estimated Creatinine Clearance: 78.8 mL/min (by C-G formula based on SCr of 0.96 mg/dL).   Medical History: Past Medical History:  Diagnosis Date   Asthma    CAD (coronary artery disease)    Diabetes (HCC)    Hypertension    Medications:  No prior anticoagulation noted  Assessment: 50 y/o female with Hx of recent MI, presents with chest pain. CT positive for RLL PE without evidence of right heart strain. Pharmacy has been consulted for initiation and management of heparin infusion for PE.  7/19-20: [facility] for isch w/u. (Pending result; Cards consulted)  Goal of Therapy:  Heparin level 0.3-0.7 units/ml Monitor platelets by anticoagulation protocol: Yes  7/15 0546 HL 0.21, subtherapeutic - 1350 units/hr 7/15 1305 HL 0.22, subtherapeutic - 1500 units/hr 7/15 2109 HL 0.22, subtherapeutic - 1650 units/hr 7/16 0725 HL <0.10, subtherapeutic -inc to 2100 units/hr 7/16 2250 HL 0.91, supratherapeutic dec to 1950 units/hr 7/17 0540 HL 0.97 supratherapeutic  dec to 1800 units/hr 7/17 1255 HL 0.90 Supratherapeutic       Recheck level w/ periph stick  (unable to get periph stick w/ 4 attempts.) Will decrease drip rate to 1650 units/hr 7/18 0012 HL 0.45, therapeutic 7/18 0505 HL 0.71, supratherapeutic; 1650>1550 un/hr 7/18 1156 HL 0.56, therapeutic  7/18 1830 HL 0.45, therapeutic 7/19 0036 HL 0.48, therapeutic X 3 7/20 0120 HL  0.16, subtherapeutic 1550 > 1800 un/hr 7/20 0950 HL 0.38, therapeutic; 1800 un/hr 7/20 1608 HL 0.16, subtherapeutic; 1800 un/hr 7/20 2300 HL 0.55, therapeutic 2100 units/hr  Plan:   Heparin: 7/20:  HL @ 2300 = 0.55 Will continue pt on current rate and recheck HL on 7/21 @ 0500.   Apixaban: --Per consult instructions, start therapy 7/21 AM --Continue heparin infusion until 7/21 at 1000 --On 7/21 at 1000, start apixaban 10 mg BID x 7 days followed by apixaban 5 mg BID for remaining duration of therapy --CBC at least every 3 days per protocol  Nasteho Glantz D 07/11/2021 1:51 AM

## 2021-07-11 NOTE — Progress Notes (Addendum)
PROGRESS NOTE    Martha Ramos  [id number] DOB: Jul 31, 1971 DOA: 07/04/2021 PCP: Darnelle Spangleodriguez, Antonio, MD   Chief complaint.  Chest pain. Brief Narrative:  Martha Ramos is a 50 y.o. African American female with medical history significant for asthma, type 2 diabetes mellitus, hypertension and coronary artery disease, who presented to the emergency room with acute onset of midsternal chest pain with associated dyspnea as well as cough occasionally productive of clear sputum and occasional wheezing. Upon arriving in the hospital, patient had a severe tachycardia, with heart rate in the 120s, leukocytosis of 20.7, lactic acidosis of 3.2.  CT angiogram of the chest showed a right lower lobe pulmonary emboli.  Mild cardiomegaly with evidence of right heart dysfunction.  Small bilateral pleural effusion with basilar atelectasis.  BNP elevated at 1128.3.  Troponin 101.  Patient was placed on IV Lasix.  She is also treated with Rocephin and Zithromax. She does not seem to have infection, antibiotic discontinued.  Patient is seen by cardiology, echocardiogram showed ejection fraction less than 20%, she appears in cardiogenic shock.  She is also placed on heparin drip for PE.  7/20 patient kept her eyes closed throughout the exam.  She would answer questions in yes no but with her eyes closed.  States she is doing fine.  No chest pain.  Shortness of breath feels better.  7/21- sleepy, reports no sob, or cp. Just states she is tired  Assessment & Plan:   Active Problems:   Acute pulmonary embolism (HCC)   Uncontrolled type 2 diabetes mellitus with hyperglycemia (HCC)   Acute on chronic combined systolic (congestive) and diastolic (congestive) heart failure (HCC)   Cardiogenic shock (HCC)   RVF (right ventricular failure) (HCC)   Cardiomyopathy (HCC)  #1.  Acute right lower lobe pulmonary emboli. Elevated troponin secondary to PE. 7/20 on room air now.  Although I saw her on nasal  cannula. Continue heparin gtt.will switch to po a/c in am since completed cardiac cath 7/21 patient will start Eliquis p.o. today with loading dose.   2.  Acute on chronic systolic congestive heart failure. Right-sided congestive heart failure. Cardiogenic shock. Nonsustained ventricular tachycardia. Sinus tachycardia Echocardiogram showed ejection fraction less than 20%.   also received a total of 50 g albumin  for hypotension. 7/20-NSVT due to LV dysfunction Sinus tachy likely due to chf 7/21 status post cardiac cath with no evidence of CAD On amiodarone for NSVT Holding beta-blockers due to hypotension Continue ivabradine Continue digoxin. Lasix held due to hypotension, may need to resume low . Bp low ok if asx. Will d/w cardiology about life vest prior to dc    #3.  Uncontrolled type 2 diabetes with hyperglycemia. BG variable Lantus 5 units daily will be started   4.  Lactic acidosis. Likely secondary to metformin, no evidence of bacterial infection       DVT prophylaxis:Eliquis Code Status: full Family Communication: None at bedside Disposition Plan:    Status is: Inpatient  Remains inpatient appropriate because:Ongoing diagnostic testing needed not appropriate for outpatient work up, IV treatments appropriate due to intensity of illness or inability to take PO, and Inpatient level of care appropriate due to severity of illness  Dispo: The patient is from: Home              Anticipated d/c is to: Home              Patient currently is not medically stable to d/c.   Difficult to place patient  No        I/O last 3 completed shifts: In: 1932.6 [P.O.:600; I.V.:1282.6; IV Piggyback:50] Out: 2300 [Urine:2300] No intake/output data recorded.     Consultants:  Card  Procedures: Heart cath-no CAD found  Antimicrobials: None  Subjective: States tired. No sob.  Objective: Vitals:   07/10/21 2316 07/11/21 0534 07/11/21 0535 07/11/21 0745  BP:  131/79  114/74 113/81  Pulse: (!) 59 (!) 124 66 95  Resp: 20   17  Temp: 98.1 F (36.7 C)  98.5 F (36.9 C) 97.6 F (36.4 C)  TempSrc:   Oral   SpO2: 100% 95% 98% 97%  Weight:      Height:        Intake/Output Summary (Last 24 hours) at 07/11/2021 0832 Last data filed at 07/11/2021 0300 Gross per 24 hour  Intake 1120.6 ml  Output 900 ml  Net 220.6 ml   Filed Weights   07/08/21 0500 07/10/21 0428 07/10/21 1027  Weight: 103.7 kg 103.4 kg 96 kg    Examination: Calm, eyes closed Decrease bs no wheezing Regular s1/s2 no gallop Soft benign +bs No edema Grossly intact    Data Reviewed: I have personally reviewed following labs and imaging studies  CBC: Recent Labs  Lab 07/04/21 1942 07/05/21 0546 07/06/21 0725 07/07/21 0540 07/08/21 0505 07/09/21 0412 07/10/21 0120  WBC 20.7*   < > 15.3* 13.7* 12.2* 10.1 18.0*  NEUTROABS 17.6*  --  11.8* 9.8*  --   --   --   HGB 12.3   < > 12.0 11.1* 11.2* 10.5* 11.5*  HCT 39.0   < > 36.9 34.5* 33.7* 31.9* 34.8*  MCV 92.2   < > 91.1 92.7 89.2 89.9 90.2  PLT 372   < > 320 338 347 350 354   < > = values in this interval not displayed.   Basic Metabolic Panel: Recent Labs  Lab 07/07/21 0540 07/07/21 1507 07/08/21 0505 07/09/21 0412 07/10/21 0120  NA 135 137 138 138 136  K 4.5 3.8 3.8 3.7 4.0  CL 101 104 103 99 98  CO2 24 28 30 30 28   GLUCOSE 249* 111* 86 102* 237*  BUN 14 14 12 13 13   CREATININE 0.69 0.76 0.65 0.75 0.96  CALCIUM 8.8* 8.8* 8.7* 8.7* 8.5*  MG 1.9 1.7 1.7 1.8 1.7  PHOS  --   --  3.8 4.0 3.8   GFR: Estimated Creatinine Clearance: 78.8 mL/min (by C-G formula based on SCr of 0.96 mg/dL). Liver Function Tests: Recent Labs  Lab 07/04/21 1942 07/05/21 0546  AST 39 20  ALT 49* 40  ALKPHOS 65 58  BILITOT 0.8 0.8  PROT 6.6 6.3*  ALBUMIN 3.3* 3.0*   Recent Labs  Lab 07/04/21 1942  LIPASE 34   No results for input(s): AMMONIA in the last 168 hours. Coagulation Profile: Recent Labs  Lab  07/04/21 1942  INR 1.1   Cardiac Enzymes: No results for input(s): CKTOTAL, CKMB, CKMBINDEX, TROPONINI in the last 168 hours. BNP (last 3 results) No results for input(s): PROBNP in the last 8760 hours. HbA1C: No results for input(s): HGBA1C in the last 72 hours. CBG: Recent Labs  Lab 07/10/21 0740 07/10/21 1128 07/10/21 1551 07/10/21 2058 07/11/21 0747  GLUCAP 213* 253* 193* 196* 168*   Lipid Profile: No results for input(s): CHOL, HDL, LDLCALC, TRIG, CHOLHDL, LDLDIRECT in the last 72 hours. Thyroid Function Tests: No results for input(s): TSH, T4TOTAL, FREET4, T3FREE, THYROIDAB in the last 72 hours. Anemia Panel:  No results for input(s): VITAMINB12, FOLATE, FERRITIN, TIBC, IRON, RETICCTPCT in the last 72 hours. Sepsis Labs: Recent Labs  Lab 07/04/21 1942 07/05/21 0037 07/05/21 0546 07/06/21 0837  PROCALCITON  --   --  0.23  --   LATICACIDVEN 3.2* 2.6*  --  1.9    Recent Results (from the past 240 hour(s))  Resp Panel by RT-PCR (Flu A&B, Covid) Nasopharyngeal Swab     Status: None   Collection Time: 07/04/21  9:38 PM   Specimen: Nasopharyngeal Swab; Nasopharyngeal(NP) swabs in vial transport medium  Result Value Ref Range Status   SARS Coronavirus 2 by RT PCR NEGATIVE NEGATIVE Final    Comment: (NOTE) SARS-CoV-2 target nucleic acids are NOT DETECTED.  The SARS-CoV-2 RNA is generally detectable in upper respiratory specimens during the acute phase of infection. The lowest concentration of SARS-CoV-2 viral copies this assay can detect is 138 copies/mL. A negative result does not preclude SARS-Cov-2 infection and should not be used as the sole basis for treatment or other patient management decisions. A negative result may occur with  improper specimen collection/handling, submission of specimen other than nasopharyngeal swab, presence of viral mutation(s) within the areas targeted by this assay, and inadequate number of viral copies(<138 copies/mL). A negative  result must be combined with clinical observations, patient history, and epidemiological information. The expected result is Negative.  Fact Sheet for Patients:  BloggerCourse.com  Fact Sheet for Healthcare Providers:  SeriousBroker.it  This test is no t yet approved or cleared by the [place] FDA and  has been authorized for detection and/or diagnosis of SARS-CoV-2 by FDA under an Emergency Use Authorization (EUA). This EUA will remain  in effect (meaning this test can be used) for the duration of the COVID-19 declaration under Section 564(b)(1) of the Act, 21 U.S.C.section 360bbb-3(b)(1), unless the authorization is terminated  or revoked sooner.       Influenza A by PCR NEGATIVE NEGATIVE Final   Influenza B by PCR NEGATIVE NEGATIVE Final    Comment: (NOTE) The Xpert Xpress SARS-CoV-2/FLU/RSV plus assay is intended as an aid in the diagnosis of influenza from Nasopharyngeal swab specimens and should not be used as a sole basis for treatment. Nasal washings and aspirates are unacceptable for Xpert Xpress SARS-CoV-2/FLU/RSV testing.  Fact Sheet for Patients: BloggerCourse.com  Fact Sheet for Healthcare Providers: SeriousBroker.it  This test is not yet approved or cleared by the [place] FDA and has been authorized for detection and/or diagnosis of SARS-CoV-2 by FDA under an Emergency Use Authorization (EUA). This EUA will remain in effect (meaning this test can be used) for the duration of the COVID-19 declaration under Section 564(b)(1) of the Act, 21 U.S.C. section 360bbb-3(b)(1), unless the authorization is terminated or revoked.  Performed at [facility], [address] [place], [place] [address]   Culture, blood (routine x 2)     Status: None   Collection Time: 07/04/21 10:01 PM   Specimen: BLOOD  Result Value Ref Range Status   Specimen  Description BLOOD BLOOD RIGHT HAND  Final   Special Requests   Final    BOTTLES DRAWN AEROBIC AND ANAEROBIC Blood Culture adequate volume   Culture   Final    NO GROWTH 5 DAYS Performed at [facility], [address], [place], [place] [address]    Report Status 07/09/2021 FINAL  Final  Culture, blood (routine x 2)     Status: None   Collection Time: 07/04/21 10:01 PM   Specimen: BLOOD  Result Value Ref Range Status   Specimen Description BLOOD RIGHT ANTECUBITAL  Final   Special Requests   Final    BOTTLES DRAWN AEROBIC AND ANAEROBIC Blood Culture adequate volume   Culture   Final    NO GROWTH 5 DAYS Performed at [facility], [address], [place], [place] [address]    Report Status 07/09/2021 FINAL  Final  Urine Culture     Status: None   Collection Time: 07/04/21 11:30 PM   Specimen: Urine, Catheterized  Result Value Ref Range Status   Specimen Description   Final    URINE, CATHETERIZED Performed at [facility], [address] [place], [place] [address]    Special Requests   Final    NONE Performed at [facility], [address] [place], [place] [address]    Culture   Final    NO GROWTH Performed at [facility], [address], [place], [place] [address]    Report Status 07/06/2021 FINAL  Final  MRSA Next Gen by PCR, Nasal     Status: None   Collection Time: 07/05/21  2:15 AM   Specimen: Nasal Mucosa; Nasal Swab  Result Value Ref Range Status   MRSA by PCR Next Gen NOT DETECTED NOT DETECTED Final    Comment: (NOTE) The GeneXpert MRSA Assay (FDA approved for NASAL specimens only), is one component of a comprehensive MRSA colonization surveillance program. It is not intended to diagnose MRSA infection nor to guide or monitor treatment for MRSA infections. Test performance is not FDA approved in patients less than 25 years old. Performed at [facility], [address] [place], [place] [address]           Radiology Studies: CARDIAC CATHETERIZATION  Result Date: 07/09/2021 Formatting of this result is different from the original.   LV end diastolic pressure is normal. Angiographically normal coronary arteries Normal LVEDP Systemic hypotension Pt given 500 cc 0.9% NS in the cath lab for treatment of hypotension. Resume heparin for treatment of PE 2 hours after TR band off. OK to transition to DOAC tomorrow if no bleeding complications arise.        Scheduled Meds:  amiodarone  200 mg Oral Daily   apixaban  10 mg Oral BID   Followed by   Melene Muller ON 07/18/2021] apixaban  5 mg Oral BID   atorvastatin  20 mg Oral Daily   Chlorhexidine Gluconate Cloth  6 each Topical Q0600   digoxin  0.25 mg Oral Daily   DULoxetine  60 mg Oral Daily   haloperidol  10 mg Oral QHS   insulin aspart  0-15 Units Subcutaneous TID AC & HS   ivabradine  5 mg Oral BID WC   mouth rinse  15 mL Mouth Rinse BID   midodrine  10 mg Oral TID WC   pantoprazole  40 mg Oral Daily   potassium chloride SA  20 mEq Oral Daily   potassium phosphate (monobasic)  500 mg Oral BID   sodium chloride flush  3 mL Intravenous Q12H   sodium chloride flush  3 mL Intravenous Q12H   Continuous Infusions:  sodium chloride     heparin 2,100 Units/hr (07/10/21 2105)     LOS: 7 days    Time spent: 35 minutes with more than 50% on COC    Lynn Ito, MD [facility]   To contact the attending provider between 7A-7P or the covering provider during after hours 7P-7A, please  log into the web site [email or web address] and access using universal [place] password for that web site. If you do not have the password, please call the hospital operator.  07/11/2021, 8:32 AM

## 2021-07-11 NOTE — Progress Notes (Signed)
Patient reported chest pain 8/10. Vitals obtained, all within normal limits, except BP initially low with systolic 89. BP repeated with patient sitting up instead of lying in bed. BP improved to 107/63. EKG obtained. MD paged and returned call after reviewing patient chart. No new orders at this time. Report given to [place], Charity fundraiser.

## 2021-07-11 NOTE — Consult Note (Signed)
ANTICOAGULATION CONSULT NOTE  Pharmacy Consult for heparin infusion >> apixaban Indication: pulmonary embolus  Patient Measurements: Heparin Dosing Weight: 79 kg   Labs: Recent Labs    07/09/21 0412 07/10/21 0120 07/10/21 0950 07/10/21 1608 07/10/21 2300 07/11/21 0846  HGB 10.5* 11.5*  --   --   --  11.0*  HCT 31.9* 34.8*  --   --   --  33.3*  PLT 350 354  --   --   --  340  HEPARINUNFRC  --  0.16*   < > 0.16* 0.55 0.54  CREATININE 0.75 0.96  --   --   --  0.68   < > = values in this interval not displayed.    Estimated Creatinine Clearance: 95.2 mL/min (by C-G formula based on SCr of 0.68 mg/dL).   Medical History: Past Medical History:  Diagnosis Date   Asthma    CAD (coronary artery disease)    Diabetes (HCC)    Hypertension    Medications:  No prior anticoagulation noted  Assessment: 50 y/o female with Hx of recent MI, presents with chest pain. CT positive for RLL PE without evidence of right heart strain. Pharmacy has been consulted for initiation and management of heparin infusion for PE.  7/19-20: LHC for isch w/u. (Pending result; Cards consulted)  Goal of Therapy:  Heparin level 0.3-0.7 units/ml Monitor platelets by anticoagulation protocol: Yes  7/15 0546 HL 0.21, subtherapeutic - 1350 units/hr 7/15 1305 HL 0.22, subtherapeutic - 1500 units/hr 7/15 2109 HL 0.22, subtherapeutic - 1650 units/hr 7/16 0725 HL <0.10, subtherapeutic -inc to 2100 units/hr 7/16 2250 HL 0.91, supratherapeutic dec to 1950 units/hr 7/17 0540 HL 0.97 supratherapeutic  dec to 1800 units/hr 7/17 1255 HL 0.90 Supratherapeutic       Recheck level w/ periph stick  (unable to get periph stick w/ 4 attempts.) Will decrease drip rate to 1650 units/hr 7/18 0012 HL 0.45, therapeutic 7/18 0505 HL 0.71, supratherapeutic; 1650>1550 un/hr 7/18 1156 HL 0.56, therapeutic  7/18 1830 HL 0.45, therapeutic 7/19 0036 HL 0.48, therapeutic X 3 7/20 0120 HL 0.16, subtherapeutic 1550 > 1800  un/hr 7/20 0950 HL 0.38, therapeutic; 1800 un/hr 7/20 1608 HL 0.16, subtherapeutic; 1800 un/hr 7/20 2300 HL 0.55, therapeutic 2100 units/hr 7/20 0846 HL 0.54, therapeutic 2100 un/hr  Plan:   Heparin: HL therapeutic. Heparin to stop at ~1000 today; then eliquis start. Will continue pt on current rate and recheck HL on 7/21 @ 0500.   Apixaban: --Per consult instructions, start therapy 7/21 AM --Continue heparin infusion until 7/21 at 1000 --On 7/21 at 1000, start apixaban 10 mg BID x 7 days followed by apixaban 5 mg BID for remaining duration of therapy --CBC at least every 3 days per protocol  Martyn Malay 07/11/2021 9:52 AM

## 2021-07-11 NOTE — Progress Notes (Signed)
Nutrition Education Note   RD consulted for nutrition education regarding diabetes and CHF  Met with pt in room today. Pt reports good appetite and oral intake at baseline. Pt documented to have eaten 100% of her breakfast this morning. Pt's lunch tray was sitting on her side table untouched; pt reports that she is tired today and just did not eat yet. Pt reports that she did not request diet education and that she knows what she is supposed to be eating. Pt declines diet education at this time. RD left the "Nutrition and Type II Diabetes" and "Low Sodium Diet Education" handouts from the Academy of Nutrition and Dietetics in patients room. Pt requesting protein shakes today; RD will add Ensure Max protein supplement BID, each supplement provides 150kcal and 30g of protein.    Lab Results  Component Value Date   HGBA1C 6.2 (H) 07/06/2021   Labs and medications reviewed. No further nutrition interventions warranted at this time. RD contact information provided. If additional nutrition issues arise, please re-consult RD.  Koleen Distance MS, RD, LDN Please refer to [facility] for RD and/or RD on-call/weekend/after hours pager

## 2021-07-12 DIAGNOSIS — I420 Dilated cardiomyopathy: Secondary | ICD-10-CM

## 2021-07-12 DIAGNOSIS — I50810 Right heart failure, unspecified: Secondary | ICD-10-CM | POA: Diagnosis not present

## 2021-07-12 DIAGNOSIS — I2609 Other pulmonary embolism with acute cor pulmonale: Secondary | ICD-10-CM | POA: Diagnosis not present

## 2021-07-12 DIAGNOSIS — I2699 Other pulmonary embolism without acute cor pulmonale: Secondary | ICD-10-CM | POA: Diagnosis not present

## 2021-07-12 DIAGNOSIS — I5043 Acute on chronic combined systolic (congestive) and diastolic (congestive) heart failure: Secondary | ICD-10-CM | POA: Diagnosis not present

## 2021-07-12 LAB — BASIC METABOLIC PANEL
Anion gap: 9 (ref 5–15)
BUN: 9 mg/dL (ref 6–20)
CO2: 30 mmol/L (ref 22–32)
Calcium: 8.8 mg/dL — ABNORMAL LOW (ref 8.9–10.3)
Chloride: 99 mmol/L (ref 98–111)
Creatinine, Ser: 0.7 mg/dL (ref 0.44–1.00)
GFR, Estimated: >60 mL/min (ref >60)
Glucose, Bld: 126 mg/dL — ABNORMAL HIGH (ref 70–99)
Potassium: 3.9 mmol/L (ref 3.5–5.1)
Sodium: 138 mmol/L (ref 135–145)

## 2021-07-12 LAB — PHOSPHORUS: Phosphorus: 4.3 mg/dL (ref 2.5–4.6)

## 2021-07-12 LAB — GLUCOSE, CAPILLARY
Glucose-Capillary: 141 mg/dL — ABNORMAL HIGH (ref 70–99)
Glucose-Capillary: 128 mg/dL — ABNORMAL HIGH (ref 70–99)

## 2021-07-12 LAB — MAGNESIUM: Magnesium: 2.1 mg/dL (ref 1.7–2.4)

## 2021-07-12 MED ORDER — METOPROLOL SUCCINATE ER 25 MG PO TB24: 12.5000 mg | ORAL_TABLET | Freq: Every day | ORAL | 0 refills | Status: DC

## 2021-07-12 MED ORDER — LOSARTAN POTASSIUM 25 MG PO TABS: 25.0000 mg | ORAL_TABLET | Freq: Every day | ORAL | 0 refills | Status: DC

## 2021-07-12 MED ORDER — APIXABAN 5 MG PO TABS: 5.0000 mg | ORAL_TABLET | Freq: Two times a day (BID) | ORAL | 0 refills | Status: AC

## 2021-07-12 MED ORDER — TORSEMIDE 20 MG PO TABS: 20.0000 mg | ORAL_TABLET | Freq: Every day | ORAL | 0 refills | Status: DC

## 2021-07-12 MED ORDER — METOPROLOL SUCCINATE ER 25 MG PO TB24
12.5000 mg | ORAL_TABLET | Freq: Every day | ORAL | Status: DC
  Administered 2021-07-12: 12.5 mg via ORAL
  Filled 2021-07-12 (×2): qty 1

## 2021-07-12 MED ORDER — MIDODRINE HCL 10 MG PO TABS: 10.0000 mg | ORAL_TABLET | Freq: Three times a day (TID) | ORAL | 0 refills | Status: AC

## 2021-07-12 MED ORDER — AMIODARONE HCL 200 MG PO TABS: 200.0000 mg | ORAL_TABLET | Freq: Every day | ORAL | 0 refills | Status: DC

## 2021-07-12 MED ORDER — APIXABAN 5 MG PO TABS: 10.0000 mg | ORAL_TABLET | Freq: Two times a day (BID) | ORAL | 0 refills | Status: DC

## 2021-07-12 MED ORDER — DIGOXIN 250 MCG PO TABS: 0.2500 mg | ORAL_TABLET | Freq: Every day | ORAL | 0 refills | Status: DC

## 2021-07-12 MED ORDER — LOSARTAN POTASSIUM 25 MG PO TABS
25.0000 mg | ORAL_TABLET | Freq: Every day | ORAL | Status: DC
  Administered 2021-07-12: 25 mg via ORAL
  Filled 2021-07-12: qty 1

## 2021-07-12 MED ORDER — IVABRADINE HCL 5 MG PO TABS: 5.0000 mg | ORAL_TABLET | Freq: Two times a day (BID) | ORAL | 0 refills | Status: AC

## 2021-07-12 NOTE — TOC Initial Note (Signed)
Transition of Care [facility]) - Initial/Assessment Note    Patient Details  Name: Martha Ramos MRN: [id number] Date of Birth: Jul 31, 1971  Transition of Care [facility]) CM/SW Contact:    Maree Krabbe, LCSW Phone Number: 07/12/2021, 1:17 PM  Clinical Narrative:   CSW spoke with pt and pt is agreeable to [facility]. Pt states she has never had it before and didn't have a agency preference. Pt is [facility] to [facility]. Pt states she lives with her three children. Pt needs shower chair and cane. DME ordered through [employer]. No additional needs at this time.                Expected Discharge Plan: Home w Home Health Services Barriers to Discharge: No Barriers Identified   Patient Goals and CMS Choice Patient states their goals for this hospitalization and ongoing recovery are:: to go home   Choice offered to / list presented to : Patient  Expected Discharge Plan and Services Expected Discharge Plan: Home w Home Health Services In-house Referral: Clinical Social Work   Post Acute Care Choice: Home Health Living arrangements for the past 2 months: Single Family Home Expected Discharge Date: 07/12/21                         HH Arranged: RN, PT, OT HH Agency: [facility] Date HH Agency Contacted: 07/12/21 Time HH Agency Contacted: [phone] Representative spoke with at [facility] Agency: Barbara Cower  Prior Living Arrangements/Services Living arrangements for the past 2 months: Single Family Home Lives with:: Adult Children Patient language and need for interpreter reviewed:: Yes Do you feel safe going back to the place where you live?: Yes      Need for Family Participation in Patient Care: Yes (Comment) Care giver support system in place?: Yes (comment)   Criminal Activity/Legal Involvement Pertinent to Current Situation/Hospitalization: No - Comment as needed  Activities of Daily Living Home Assistive Devices/Equipment: None ADL Screening (condition at time of admission) Patient's  cognitive ability adequate to safely complete daily activities?: Yes Is the patient deaf or have difficulty hearing?: No Does the patient have difficulty seeing, even when wearing glasses/contacts?: No Does the patient have difficulty concentrating, remembering, or making decisions?: No Patient able to express need for assistance with ADLs?: Yes Does the patient have difficulty dressing or bathing?: No Independently performs ADLs?: Yes (appropriate for developmental age) Does the patient have difficulty walking or climbing stairs?: No Weakness of Legs: None Weakness of Arms/Hands: None  Permission Sought/Granted Permission sought to share information with : Family Supports    Share Information with NAME: zatori     Permission granted to share info w Relationship: daughter     Emotional Assessment Appearance:: Appears stated age Attitude/Demeanor/Rapport: Engaged Affect (typically observed): Accepting Orientation: : Oriented to Self, Oriented to  Time, Oriented to Place, Oriented to Situation Alcohol / Substance Use: Not Applicable Psych Involvement: No (comment)  Admission diagnosis:  Acute pulmonary embolism (HCC) [I26.99] Acute pulmonary embolism without acute cor pulmonale, unspecified pulmonary embolism type (HCC) [I26.99] Patient Active Problem List   Diagnosis Date Noted   Cardiomyopathy [facility])    RVF (right ventricular failure) (HCC) 07/07/2021   Acute on chronic systolic CHF (congestive heart failure) (HCC) 07/06/2021   Acute on chronic combined systolic (congestive) and diastolic (congestive) heart failure (HCC)    Cardiogenic shock (HCC)    Acute on chronic diastolic CHF (congestive heart failure) (HCC) 07/05/2021   Uncontrolled type 2 diabetes mellitus  with hyperglycemia (HCC) 07/05/2021   Acute pulmonary embolism (HCC) 07/04/2021   PCP:  Darnelle Spangle, MD Pharmacy:   [id number] [place], [place] - [address] AT [facility] [address] [place] [address] Phone: [phone] Fax: [phone]     Social Determinants of Health (SDOH) Interventions    Readmission Risk Interventions No flowsheet data found.

## 2021-07-12 NOTE — Care Management Important Message (Signed)
Important Message  Patient Details  Name: Martha Ramos MRN: [id number] Date of Birth: June 20, 1971   Medicare Important Message Given:  Yes     Johnell Comings 07/12/2021, 11:25 AM

## 2021-07-12 NOTE — Progress Notes (Signed)
Progress Note  Patient Name: Martha Ramos Date of Encounter: 07/12/2021  [facility] Cardiologist: [facility]  Subjective   Again on rounds today, minimally conversant, goes back to sleep Does not want to participate, interact Telemetry, maintaining normal sinus rhythm  Echocardiogram reviewed severely reduced LV function, RV on four-chamber axial images moderately depressed RV function, appears moderately dilated  Inpatient Medications    Scheduled Meds:  amiodarone  200 mg Oral Daily   apixaban  10 mg Oral BID   Followed by   Melene Muller ON 07/18/2021] apixaban  5 mg Oral BID   atorvastatin  20 mg Oral Daily   Chlorhexidine Gluconate Cloth  6 each Topical Q0600   digoxin  0.25 mg Oral Daily   DULoxetine  60 mg Oral Daily   haloperidol  10 mg Oral QHS   insulin aspart  0-15 Units Subcutaneous TID AC & HS   insulin glargine  5 Units Subcutaneous Daily   ivabradine  5 mg Oral BID WC   mouth rinse  15 mL Mouth Rinse BID   midodrine  10 mg Oral TID WC   pantoprazole  40 mg Oral Daily   potassium chloride SA  20 mEq Oral Daily   potassium phosphate (monobasic)  500 mg Oral BID   Ensure Max Protein  11 oz Oral BID   sodium chloride flush  3 mL Intravenous Q12H   sodium chloride flush  3 mL Intravenous Q12H   torsemide  20 mg Oral Daily   Continuous Infusions:  sodium chloride     PRN Meds: sodium chloride, acetaminophen **OR** acetaminophen, albuterol, magnesium hydroxide, morphine injection, ondansetron **OR** ondansetron (ZOFRAN) IV, sodium chloride flush, traZODone   Vital Signs    Vitals:   07/12/21 0631 07/12/21 0807 07/12/21 0900 07/12/21 1204  BP:  120/87  120/77  Pulse:  (!) 112  74  Resp:  18 20 16   Temp:  98.1 F (36.7 C)  97.9 F (36.6 C)  TempSrc:  Oral    SpO2:  97%  99%  Weight: 96.7 kg     Height:        Intake/Output Summary (Last 24 hours) at 07/12/2021 1226 Last data filed at 07/11/2021 2233 Gross per 24 hour  Intake 240 ml  Output 2250 ml   Net -2010 ml   Last 3 Weights 07/12/2021 07/11/2021 07/10/2021  Weight (lbs) 213 lb 3 oz 214 lb 1.1 oz 211 lb 10.3 oz  Weight (kg) 96.7 kg 97.1 kg 96 kg      Telemetry    Normal sinus rhythm- Personally Reviewed  ECG    - Personally Reviewed  Physical Exam   On examination : alert , minimally communicative unable to estimate JVD, lungs clear to auscultation bilaterally, heart sounds regular normal S1-S2 no murmurs appreciated, abdomen soft nontender no significant lower extremity edema.  Musculoskeletal exam with good range of motion, neurologic exam grossly nonfocal  Labs    High Sensitivity Troponin:   Recent Labs  Lab 07/04/21 1942 07/04/21 2138  TROPONINIHS 101* 92*      Chemistry Recent Labs  Lab 07/10/21 0120 07/11/21 0846 07/12/21 0515  NA 136 138 138  K 4.0 3.7 3.9  CL 98 96* 99  CO2 28 28 30   GLUCOSE 237* 233* 126*  BUN 13 10 9   CREATININE 0.96 0.68 0.70  CALCIUM 8.5* 9.2 8.8*  GFRNONAA >60 >60 >60  ANIONGAP 10 14 9      Hematology Recent Labs  Lab 07/09/21 0412 07/10/21 0120  07/11/21 0846  WBC 10.1 18.0* 14.5*  RBC 3.55* 3.86* 3.69*  HGB 10.5* 11.5* 11.0*  HCT 31.9* 34.8* 33.3*  MCV 89.9 90.2 90.2  MCH 29.6 29.8 29.8  MCHC 32.9 33.0 33.0  RDW 14.2 14.5 14.4  PLT 350 354 340    BNP Recent Labs  Lab 07/06/21 0837  BNP 1,430.3*     DDimer No results for input(s): DDIMER in the last 168 hours.   Radiology    No results found.  Cardiac Studies   Echo  1. Severely dilated systolic cardiomyopathy.   2. Left ventricular ejection fraction, by estimation, is <20%. The left  ventricle has severely decreased function. The left ventricle has no  regional wall motion abnormalities. The left ventricular internal cavity  size was severely dilated. Left  ventricular diastolic parameters were normal.   3. Right ventricular systolic function is severely reduced. The right  ventricular size is moderately enlarged.   4. Left atrial size was  moderately dilated.   5. Right atrial size was moderately dilated.   6. The mitral valve is grossly normal. Moderate mitral valve  regurgitation.   7. Tricuspid valve regurgitation is moderate.   8. The aortic valve is grossly normal. Aortic valve regurgitation is not  visualized.   Patient Profile      Martha Ramos is a 50 y.o. 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   Assessment & Plan   # Acute pulmonary embolism:  # RV failure:  Sedentary lifestyle, has been most of her time in bed, high risk of recurrent DVT and PE Being treated with Eliquis 5 twice daily Encouraged to get out of bed, ambulate  # Acute systolic and diastolic heart failure: # Cardiogenic shock: new onset LVEF less than 20%.   Prior echo 03/2020 revealed normal systolic function.   Possible stress cardiomyopathy Cardiac catheterization July 09, 2021 normal coronary arteries continue digoxin, Corlanor, torsemide 20 daily Blood pressure is normalized, add losartan 25 daily, metoprolol succinate 12.5 daily Titration of medications as outpatient  Bipolar disorder Receiving Haldol   [facility] will sign off.   Medication Recommendations: Changes as above Other recommendations (labs, testing, etc): No further testing Follow up as an outpatient: We will arrange outpatient follow-up   Case discussed with medicine service  Total encounter time more than 35 minutes  Greater than 50% was spent in counseling and coordination of care with the patient    For questions or updates, please contact [facility] Please consult [email or web address] for contact info under        Signed, Julien Nordmann, MD  07/12/2021, 12:26 PM

## 2021-07-12 NOTE — Evaluation (Signed)
Physical Therapy Evaluation Patient Details Name: Martha Ramos MRN: [id number] DOB: [birth year] Today's Date: 07/12/2021   History of Present Illness  50 y.o. African American female with medical history significant for asthma, type 2 diabetes mellitus, hypertension and coronary artery disease, who presented to the emergency room with acute onset of midsternal chest pain (10/10 pain) described as tightness that started earlier today with associated dyspnea as well as cough occasionally productive of clear sputum and occasional wheezing.  Clinical Impression  Upon PT arrival, pt was mid-transfer to toilet; no staff present, not using AD. PT recommended use of RW for safety - pt uses RW at baseline. Upon completion of toileting, pt was pleasant and agreeable to therapy. She sat EOB during history with no LOB. Mild dizziness was reported with initial standing; symptoms cleared within 1 minute. Pt ambulated 29ft using RW with SOB throughout and fatigue reported. Pt demonstrates generalized BLE weakness, decreased stability, endurance, gait speed and increased time required to complete transfers and bed mobility. She seems to be functioning at baseline. Pt would benefit from skilled PT to address above deficits and promote optimal return to PLOF.     Follow Up Recommendations Home health PT    Equipment Recommendations  None recommended by PT    Recommendations for Other Services       Precautions / Restrictions Precautions Precautions: Fall Restrictions Weight Bearing Restrictions: No      Mobility  Bed Mobility Overal bed mobility: Modified Independent             General bed mobility comments: Using bed rails, increased time    Transfers Overall transfer level: Needs assistance Equipment used: Rolling walker (2 wheeled) Transfers: Sit to/from Stand Sit to Stand: Supervision         General transfer comment: for safety using HHA (limited  space)  Ambulation/Gait Ambulation/Gait assistance: Min guard Gait Distance (Feet): 60 Feet Assistive device: Rolling walker (2 wheeled) Gait Pattern/deviations: Step-through pattern;Narrow base of support;Trunk flexed;Decreased step length - right;Decreased step length - left;Decreased stride length Gait velocity: decreased   General Gait Details: Increased UE support through RW. Forward posture. CGA for safety.  Stairs            Wheelchair Mobility    Modified Rankin (Stroke Patients Only)       Balance Overall balance assessment: Mild deficits observed, not formally tested;Needs assistance Sitting-balance support: No upper extremity supported;Feet supported Sitting balance-Leahy Scale: Good     Standing balance support: Bilateral upper extremity supported;During functional activity Standing balance-Leahy Scale: Fair Standing balance comment: Pt reaching for furniture with HHA; improved stability and safety with RW                             Pertinent Vitals/Pain Pain Assessment: No/denies pain    Home Living Family/patient expects to be discharged to:: Private residence Living Arrangements: Children Available Help at Discharge: Family Type of Home: House Home Access: Stairs to enter Entrance Stairs-Rails: None Entrance Stairs-Number of Steps: 1 Home Layout: One level Home Equipment: Environmental consultant - 2 wheels;Bedside commode;Shower seat (pt states the shower chair does not fit in her shower; she would like an adjustable tub bench) Additional Comments: Uses RW at baseline.    Prior Function Level of Independence: Needs assistance   Gait / Transfers Assistance Needed: daughter present at all times  ADL's / Homemaking Assistance Needed: Daughter assists with dressing/bathing/toileting  Comments: Ambulates short distances. Lives with 3 children  between the ages of 22-27. Children are avaialble to assist with caregiving. Pt does not drive.     Hand  Dominance   Dominant Hand: Right    Extremity/Trunk Assessment   Upper Extremity Assessment Upper Extremity Assessment: Overall WFL for tasks assessed    Lower Extremity Assessment Lower Extremity Assessment: Generalized weakness    Cervical / Trunk Assessment Cervical / Trunk Assessment: Kyphotic  Communication   Communication: No difficulties  Cognition Arousal/Alertness: Awake/alert Behavior During Therapy: WFL for tasks assessed/performed Overall Cognitive Status: Within Functional Limits for tasks assessed                                 General Comments: A&Ox4      General Comments      Exercises Other Exercises Other Exercises: Pt transfered to toilet at beginning of session; SUP for safety due to decreased stability, pt not using AD. Education provided on safety with ambulation, rec to use RW in room and upon d/c.   Assessment/Plan    PT Assessment Patient needs continued PT services  PT Problem List Decreased strength;Decreased mobility;Decreased safety awareness;Decreased activity tolerance;Cardiopulmonary status limiting activity;Decreased balance;Decreased skin integrity       PT Treatment Interventions DME instruction;Therapeutic activities;Gait training;Therapeutic exercise;Patient/family education;Balance training;Functional mobility training;Neuromuscular re-education    PT Goals (Current goals can be found in the Care Plan section)  Acute Rehab PT Goals Patient Stated Goal: to go home PT Goal Formulation: With patient Time For Goal Achievement: 07/26/21 Potential to Achieve Goals: Fair    Frequency Min 2X/week   Barriers to discharge        Co-evaluation               AM-PAC PT "6 Clicks" Mobility  Outcome Measure Help needed turning from your back to your side while in a flat bed without using bedrails?: None Help needed moving from lying on your back to sitting on the side of a flat bed without using bedrails?: A  Little Help needed moving to and from a bed to a chair (including a wheelchair)?: A Little Help needed standing up from a chair using your arms (e.g., wheelchair or bedside chair)?: A Little Help needed to walk in hospital room?: A Little Help needed climbing 3-5 steps with a railing? : A Little 6 Click Score: 19    End of Session Equipment Utilized During Treatment: Gait belt Activity Tolerance: Patient tolerated treatment well;Patient limited by fatigue;Other (comment) (limited activity tolerance due to SOB, difficulty recovering) Patient left: in bed;with call bell/phone within reach Nurse Communication: Other (comment) (communicated that PT eval had been completed for today's d/c planning) PT Visit Diagnosis: Unsteadiness on feet (R26.81);Other abnormalities of gait and mobility (R26.89);Muscle weakness (generalized) (M62.81);Difficulty in walking, not elsewhere classified (R26.2)    Time: 1206-1222 PT Time Calculation (min) (ACUTE ONLY): 16 min   Charges:   PT Evaluation $PT Eval Moderate Complexity: 1 Mod         Casson Catena PT, DPT 07/12/21 1:15 PM [phone]   Lavenia Atlas 07/12/2021, 1:06 PM

## 2021-07-12 NOTE — Discharge Summary (Signed)
Fernanda Twaddell ZOX:[id number] DOB: [birth year] DOA: 07/04/2021  PCP: Darnelle Spangle, MD  Admit date: 07/04/2021 Discharge date: 07/12/2021  Admitted From: Home Disposition: Home  Recommendations for Outpatient Follow-up:  Follow up with PCP in 1 week Please obtain BMP/CBC in one week Please follow up cardiology in 1 week Follow-up with heart failure clinic in 1 week  Home Health: Yes   Discharge Condition:Stable CODE STATUS: Full Diet recommendation: Heart Healthy / Carb Modified  Brief/Interim Summary: Per WJX:[id number] Cara is a 50 y.o. African American female with medical history significant for asthma, type 2 diabetes mellitus, hypertension and coronary artery disease, who presented to the emergency room with acute onset of midsternal chest pain felt this tightness that started earlier today with associated dyspnea as well as cough occasionally productive of clear sputum as well as occasional wheezing.  She graded her pain was 10/10 in severity.  She was having mild palpitations.  CTA revealed right lower lobe pulmonary artery embolism, mild cardiomegaly with evidence of right heart dysfunction, please see full report below.  BNP was elevated so was her troponin.  She was started on IV Lasix and empiric antibiotics for possible pneumonia.   Patient was also found with acute new onset of CHF.  Echocardiogram was ordered and was found with EF less than 20% and it appeared that patient was in cardiogenic shock.  Cardiology was consulted.   #1.  Acute right lower lobe pulmonary emboli. Elevated troponin secondary to PE. Was on heparin gtt with transitioning to Eliquis. F/u with pcp for further management   2.  Acute on chronic systolic congestive heart failure. Right-sided congestive heart failure. Cardiogenic shock. Nonsustained ventricular tachycardia. Sinus tachycardia Echocardiogram showed ejection fraction less than 20%.   also received a total of 50 g albumin  for  hypotension. NSVT due to LV dysfunction Sinus tachy likely due to [facility] was following  Patient is status post cardiac cath with no evidence of CAD Started on amiodarone for NSVT Continue cardiac meds and titration as outpatient.  She is on ARB, beta-blocker, torsemide Continue Ivabradine and torsemide.     #3.  Uncontrolled type 2 diabetes with hyperglycemia. Will need to continue home regimen except for metformin F/u with pcp for further management   4.  Lactic acidosis. Likely secondary to metformin, no evidence of bacterial infection  Sepsis ruled out.            Discharge Diagnoses:  Active Problems:   Acute pulmonary embolism (HCC)   Uncontrolled type 2 diabetes mellitus with hyperglycemia (HCC)   Acute on chronic combined systolic (congestive) and diastolic (congestive) heart failure (HCC)   Cardiogenic shock (HCC)   RVF (right ventricular failure) (HCC)   Cardiomyopathy (HCC)    Discharge Instructions  Discharge Instructions     AMB referral to CHF clinic   Complete by: As directed    Diet - low sodium heart healthy   Complete by: As directed    Discharge instructions   Complete by: As directed    Follow up with pcp and cardiology in one week   Increase activity slowly   Complete by: As directed       Allergies as of 07/12/2021   No Known Allergies      Medication List     STOP taking these medications    metFORMIN 500 MG tablet Commonly known as: GLUCOPHAGE   potassium chloride SA 20 MEQ tablet Commonly known as: KLOR-CON       TAKE these medications  amiodarone 200 MG tablet Commonly known as: PACERONE Take 1 tablet (200 mg total) by mouth daily. Start taking on: July 13, 2021   apixaban 5 MG Tabs tablet Commonly known as: ELIQUIS Take 2 tablets (10 mg total) by mouth 2 (two) times daily for 11 doses. Specifically: Take 10mg  twice daily (7/22 PM-7/27 PM); then start 5mg  twice daily dosing (7/28 AM).   apixaban 5 MG  Tabs tablet Commonly known as: ELIQUIS Take 1 tablet (5 mg total) by mouth 2 (two) times daily for 23 days. Specifically: Take 10mg  twice daily (7/22 PM-7/27 PM); then start 5mg  twice daily dosing (7/28 AM). Start taking on: July 18, 2021   atorvastatin 20 MG tablet Commonly known as: LIPITOR Take 1 tablet by mouth daily.   digoxin 0.25 MG tablet Commonly known as: LANOXIN Take 1 tablet (0.25 mg total) by mouth daily. Start taking on: July 13, 2021   DULoxetine 60 MG capsule Commonly known as: CYMBALTA Take 60 mg by mouth daily.   glipiZIDE 5 MG tablet Commonly known as: GLUCOTROL Take 5 mg by mouth 2 (two) times daily.   haloperidol 10 MG tablet Commonly known as: HALDOL Take 10 mg by mouth at bedtime.   ivabradine 5 MG Tabs tablet Commonly known as: CORLANOR Take 1 tablet (5 mg total) by mouth 2 (two) times daily with a meal.   K-Phos 500 MG tablet Generic drug: potassium phosphate (monobasic) Take 500 mg by mouth 2 (two) times daily.   losartan 25 MG tablet Commonly known as: COZAAR Take 1 tablet (25 mg total) by mouth daily. Start taking on: July 13, 2021   metoprolol succinate 25 MG 24 hr tablet Commonly known as: TOPROL-XL Take 0.5 tablets (12.5 mg total) by mouth daily. Start taking on: July 13, 2021   midodrine 10 MG tablet Commonly known as: PROAMATINE Take 1 tablet (10 mg total) by mouth 3 (three) times daily with meals.   omeprazole 20 MG capsule Commonly known as: PRILOSEC Take 20 mg by mouth 2 (two) times daily.   torsemide 20 MG tablet Commonly known as: DEMADEX Take 1 tablet (20 mg total) by mouth daily. Start taking on: July 13, 2021   Ventolin HFA 108 (90 Base) MCG/ACT inhaler Generic drug: albuterol Inhale 1-2 puffs into the lungs every 4 (four) hours as needed.               Durable Medical Equipment  (From admission, onward)           Start     Ordered   07/12/21 1327  For home use only DME Cane  Once        07/12/21  1326   07/12/21 1327  For home use only DME Shower stool  Once        07/12/21 1326            Follow-up Information     Gollan, Tollie Pizza, MD Follow up in 1 week(s).   Specialty: Cardiology Contact information: [address] [place] [address] [phone]         Darnelle Spangle, MD Follow up in 1 week(s).   Specialty: Family Medicine Contact information: [address] [place] [address] [phone]                No Known Allergies  Consultations: cardiology   Procedures/Studies: CT Angio Chest PE W/Cm &/Or Wo Cm  Result Date: 07/04/2021 CLINICAL DATA:  50 year old female with abdominal pain and  chest pain. EXAM: CT ANGIOGRAPHY CHEST CT ABDOMEN AND PELVIS WITH CONTRAST TECHNIQUE: Multidetector CT imaging of the chest was performed using the standard protocol during bolus administration of intravenous contrast. Multiplanar CT image reconstructions and MIPs were obtained to evaluate the vascular anatomy. Multidetector CT imaging of the abdomen and pelvis was performed using the standard protocol during bolus administration of intravenous contrast. CONTRAST:  OMNIPAQUE IOHEXOL 350 MG/ML SOLN COMPARISON:  Chest radiograph dated 07/04/2021. FINDINGS: CTA CHEST FINDINGS Cardiovascular: There is mild cardiomegaly. There is retrograde flow of contrast from the right atrium into the IVC suggestive of right heart dysfunction. Correlation with echocardiogram recommended. No pericardial effusion. The thoracic aorta is unremarkable. Pulmonary artery embolus noted at the right lower lobe lobar branch point (145/5). No CT evidence of right heart straining. Mediastinum/Nodes: There is no hilar or mediastinal adenopathy. The esophagus and the thyroid gland are grossly unremarkable. No mediastinal fluid collection. Lungs/Pleura: Small bilateral pleural effusions with bibasilar atelectasis. Pneumonia is not excluded clinical correlation is  recommended. No pneumothorax. The central airways are patent. Musculoskeletal: Diffuse subcutaneous edema. No fluid collection. No acute osseous pathology. Review of the MIP images confirms the above findings. CT ABDOMEN and PELVIS FINDINGS No intra-abdominal free air. Small ascites. Hepatobiliary: No focal liver abnormality is seen. No gallstones, gallbladder wall thickening, or biliary dilatation. Pancreas: Unremarkable. No pancreatic ductal dilatation or surrounding inflammatory changes. Spleen: Normal in size without focal abnormality. Adrenals/Urinary Tract: The adrenal glands are unremarkable. There is no hydronephrosis and side. There is symmetric enhancement and excretion of contrast by both kidneys. The visualized ureters and the urinary bladder appear unremarkable. Stomach/Bowel: There is no bowel obstruction or active inflammation. The appendix is normal. Vascular/Lymphatic: Mild aortoiliac atherosclerotic disease. The IVC is unremarkable. No portal venous gas. There is no adenopathy. Reproductive: Hysterectomy. No adnexal masses. Other: Diffuse subcutaneous edema. Small fat containing umbilical hernia. Musculoskeletal: No acute or significant osseous findings. Review of the MIP images confirms the above findings. IMPRESSION: 1. Right lower lobe lobar pulmonary artery embolus. No CT evidence of right heart straining. 2. Mild cardiomegaly with evidence of right heart dysfunction. Correlation with echocardiogram recommended. 3. Small bilateral pleural effusions with bibasilar atelectasis. Pneumonia is not excluded. Clinical correlation is recommended. 4. Small ascites and diffuse subcutaneous edema and anasarca. 5. No bowel obstruction. Normal appendix. 6. Aortic Atherosclerosis (ICD10-I70.0). These results were called by telephone at the time of interpretation on 07/04/2021 at 9:31 pm to provider [facility] , who verbally acknowledged these results. Electronically Signed   By: Elgie Collard M.D.    On: 07/04/2021 21:34   CT Abdomen Pelvis W Contrast  Result Date: 07/04/2021 CLINICAL DATA:  50 year old female with abdominal pain and chest pain. EXAM: CT ANGIOGRAPHY CHEST CT ABDOMEN AND PELVIS WITH CONTRAST TECHNIQUE: Multidetector CT imaging of the chest was performed using the standard protocol during bolus administration of intravenous contrast. Multiplanar CT image reconstructions and MIPs were obtained to evaluate the vascular anatomy. Multidetector CT imaging of the abdomen and pelvis was performed using the standard protocol during bolus administration of intravenous contrast. CONTRAST:  OMNIPAQUE IOHEXOL 350 MG/ML SOLN COMPARISON:  Chest radiograph dated 07/04/2021. FINDINGS: CTA CHEST FINDINGS Cardiovascular: There is mild cardiomegaly. There is retrograde flow of contrast from the right atrium into the IVC suggestive of right heart dysfunction. Correlation with echocardiogram recommended. No pericardial effusion. The thoracic aorta is unremarkable. Pulmonary artery embolus noted at the right lower lobe lobar branch point (145/5). No CT evidence of right heart straining.  Mediastinum/Nodes: There is no hilar or mediastinal adenopathy. The esophagus and the thyroid gland are grossly unremarkable. No mediastinal fluid collection. Lungs/Pleura: Small bilateral pleural effusions with bibasilar atelectasis. Pneumonia is not excluded clinical correlation is recommended. No pneumothorax. The central airways are patent. Musculoskeletal: Diffuse subcutaneous edema. No fluid collection. No acute osseous pathology. Review of the MIP images confirms the above findings. CT ABDOMEN and PELVIS FINDINGS No intra-abdominal free air. Small ascites. Hepatobiliary: No focal liver abnormality is seen. No gallstones, gallbladder wall thickening, or biliary dilatation. Pancreas: Unremarkable. No pancreatic ductal dilatation or surrounding inflammatory changes. Spleen: Normal in size without focal abnormality.  Adrenals/Urinary Tract: The adrenal glands are unremarkable. There is no hydronephrosis and side. There is symmetric enhancement and excretion of contrast by both kidneys. The visualized ureters and the urinary bladder appear unremarkable. Stomach/Bowel: There is no bowel obstruction or active inflammation. The appendix is normal. Vascular/Lymphatic: Mild aortoiliac atherosclerotic disease. The IVC is unremarkable. No portal venous gas. There is no adenopathy. Reproductive: Hysterectomy. No adnexal masses. Other: Diffuse subcutaneous edema. Small fat containing umbilical hernia. Musculoskeletal: No acute or significant osseous findings. Review of the MIP images confirms the above findings. IMPRESSION: 1. Right lower lobe lobar pulmonary artery embolus. No CT evidence of right heart straining. 2. Mild cardiomegaly with evidence of right heart dysfunction. Correlation with echocardiogram recommended. 3. Small bilateral pleural effusions with bibasilar atelectasis. Pneumonia is not excluded. Clinical correlation is recommended. 4. Small ascites and diffuse subcutaneous edema and anasarca. 5. No bowel obstruction. Normal appendix. 6. Aortic Atherosclerosis (ICD10-I70.0). These results were called by telephone at the time of interpretation on 07/04/2021 at 9:31 pm to provider [facility] , who verbally acknowledged these results. Electronically Signed   By: Elgie Collard M.D.   On: 07/04/2021 21:34   CARDIAC CATHETERIZATION  Result Date: 07/09/2021 Formatting of this result is different from the original.   LV end diastolic pressure is normal. Angiographically normal coronary arteries Normal LVEDP Systemic hypotension Pt given 500 cc 0.9% NS in the cath lab for treatment of hypotension. Resume heparin for treatment of PE 2 hours after TR band off. OK to transition to DOAC tomorrow if no bleeding complications arise.   US Venous Img Lower Bilateral (DVT)  Result Date: 07/05/2021 CLINICAL DATA:  50 year old  female with PE. Evaluate for residual DVT. EXAM: BILATERAL LOWER EXTREMITY VENOUS DOPPLER ULTRASOUND TECHNIQUE: Gray-scale sonography with graded compression, as well as color Doppler and duplex ultrasound were performed to evaluate the lower extremity deep venous systems from the level of the common femoral vein and including the common femoral, femoral, profunda femoral, popliteal and calf veins including the posterior tibial, peroneal and gastrocnemius veins when visible. The superficial great saphenous vein was also interrogated. Spectral Doppler was utilized to evaluate flow at rest and with distal augmentation maneuvers in the common femoral, femoral and popliteal veins. COMPARISON:  None. FINDINGS: RIGHT LOWER EXTREMITY Common Femoral Vein: No evidence of thrombus. Normal compressibility, respiratory phasicity and response to augmentation. Saphenofemoral Junction: No evidence of thrombus. Normal compressibility and flow on color Doppler imaging. Profunda Femoral Vein: No evidence of thrombus. Normal compressibility and flow on color Doppler imaging. Femoral Vein: No evidence of thrombus. Normal compressibility, respiratory phasicity and response to augmentation. Popliteal Vein: No evidence of thrombus. Normal compressibility, respiratory phasicity and response to augmentation. Calf Veins: No evidence of thrombus. Normal compressibility and flow on color Doppler imaging. Superficial Great Saphenous Vein: No evidence of thrombus. Normal compressibility. Venous Reflux:  None. Other Findings:  None. LEFT LOWER EXTREMITY Common Femoral Vein: No evidence of thrombus. Normal compressibility, respiratory phasicity and response to augmentation. Saphenofemoral Junction: No evidence of thrombus. Normal compressibility and flow on color Doppler imaging. Profunda Femoral Vein: No evidence of thrombus. Normal compressibility and flow on color Doppler imaging. Femoral Vein: No evidence of thrombus. Normal compressibility,  respiratory phasicity and response to augmentation. Popliteal Vein: No evidence of thrombus. Normal compressibility, respiratory phasicity and response to augmentation. Calf Veins: No evidence of thrombus. Normal compressibility and flow on color Doppler imaging. Superficial Great Saphenous Vein: No evidence of thrombus. Normal compressibility. Venous Reflux:  None. Other Findings:  None. IMPRESSION: No evidence of deep venous thrombosis in either lower extremity. Electronically Signed   By: Malachy Moan M.D.   On: 07/05/2021 11:04   US Venous Img Upper Uni Right(DVT)  Result Date: 07/05/2021 CLINICAL DATA:  Pulmonary embolus, right upper extremity pain and edema EXAM: RIGHT UPPER EXTREMITY VENOUS DOPPLER ULTRASOUND TECHNIQUE: Gray-scale sonography with graded compression, as well as color Doppler and duplex ultrasound were performed to evaluate the upper extremity deep venous system from the level of the subclavian vein and including the jugular, axillary, basilic, radial, ulnar and upper cephalic vein. Spectral Doppler was utilized to evaluate flow at rest and with distal augmentation maneuvers. COMPARISON:  None. FINDINGS: Contralateral Subclavian Vein: Respiratory phasicity is normal and symmetric with the symptomatic side. No evidence of thrombus. Normal compressibility. Internal Jugular Vein: No evidence of thrombus. Normal compressibility, respiratory phasicity and response to augmentation. Subclavian Vein: No evidence of thrombus. Normal compressibility, respiratory phasicity and response to augmentation. Axillary Vein: No evidence of thrombus. Normal compressibility, respiratory phasicity and response to augmentation. Cephalic Vein: No evidence of thrombus. Normal compressibility, respiratory phasicity and response to augmentation. Basilic Vein: No evidence of thrombus. Normal compressibility, respiratory phasicity and response to augmentation. Brachial Veins: No evidence of thrombus. Normal  compressibility, respiratory phasicity and response to augmentation. Radial Veins: No evidence of thrombus. Normal compressibility, respiratory phasicity and response to augmentation. Ulnar Veins: No evidence of thrombus. Normal compressibility, respiratory phasicity and response to augmentation. IMPRESSION: No evidence of DVT within the right upper extremity. Electronically Signed   By: Judie Petit.  Shick M.D.   On: 07/05/2021 12:33   DG Chest Port 1 View  Result Date: 07/06/2021 CLINICAL DATA:  Check line placement EXAM: PORTABLE CHEST 1 VIEW COMPARISON:  July 04, 2021 FINDINGS: The new right central line terminates just in the right side of the atrium, approximately 2 cm above the caval atrial junction. No pneumothorax. No other changes. IMPRESSION: The new central line terminates 2 cm distal to the caval atrial junction. Recommend withdrawing 2 cm. No pneumothorax. No other change. These results will be called to the ordering clinician or representative by the Radiologist Assistant, and communication documented in the PACS or [employer]. Electronically Signed   By: Gerome Sam III M.D   On: 07/06/2021 16:00   DG Chest Portable 1 View  Result Date: 07/04/2021 CLINICAL DATA:  Chest pain EXAM: PORTABLE CHEST 1 VIEW COMPARISON:  None. FINDINGS: Cardiac shadow is enlarged but somewhat accentuated by the portable technique. Small bilateral pleural effusions are noted right greater than left. Likely right basilar atelectasis is present as well. No bony abnormality is seen. IMPRESSION: Small effusions bilaterally with likely right basilar atelectasis. Electronically Signed   By: Alcide Clever M.D.   On: 07/04/2021 20:29   ECHOCARDIOGRAM COMPLETE  Result Date: 07/05/2021    ECHOCARDIOGRAM REPORT   Patient Name:  Henrene Dodge Date of Exam: 07/05/2021 Medical Rec #:  [id number]         Height:       64.0 in Accession #:    [id number]        Weight:       288.6 lb Date of Birth:  1971/10/05         BSA:           2.286 m Patient Age:    49 years          BP:           102/77 mmHg Patient Gender: F                 HR:           108 bpm. Exam Location:  [facility] Procedure: 2D Echo, Color Doppler, Cardiac Doppler and Intracardiac            Opacification Agent Indications:     I26.09 Pulmonary Embolus  History:         Patient has no prior history of Echocardiogram examinations.                  CAD; Risk Factors:Hypertension and Diabetes. Asthma.  Sonographer:     Humphrey Rolls RDCS (AE) Referring Phys:  [id number] Vernetta Honey MANSY Diagnosing Phys: Alwyn Pea MD IMPRESSIONS  1. Severely dilated systolic cardiomyopathy.  2. Left ventricular ejection fraction, by estimation, is <20%. The left ventricle has severely decreased function. The left ventricle has no regional wall motion abnormalities. The left ventricular internal cavity size was severely dilated. Left ventricular diastolic parameters were normal.  3. Right ventricular systolic function is severely reduced. The right ventricular size is moderately enlarged.  4. Left atrial size was moderately dilated.  5. Right atrial size was moderately dilated.  6. The mitral valve is grossly normal. Moderate mitral valve regurgitation.  7. Tricuspid valve regurgitation is moderate.  8. The aortic valve is grossly normal. Aortic valve regurgitation is not visualized. FINDINGS  Left Ventricle: Left ventricular ejection fraction, by estimation, is <20%. The left ventricle has severely decreased function. The left ventricle has no regional wall motion abnormalities. Definity contrast agent was given IV to delineate the left ventricular endocardial borders. The left ventricular internal cavity size was severely dilated. There is no left ventricular hypertrophy. Left ventricular diastolic parameters were normal. Right Ventricle: The right ventricular size is moderately enlarged. No increase in right ventricular wall thickness. Right ventricular systolic function is severely reduced. Left  Atrium: Left atrial size was moderately dilated. Right Atrium: Right atrial size was moderately dilated. Pericardium: There is no evidence of pericardial effusion. Mitral Valve: The mitral valve is grossly normal. There is mild thickening of the mitral valve leaflet(s). Normal mobility of the mitral valve leaflets. Mild mitral annular calcification. Moderate mitral valve regurgitation. MV peak gradient, 4.0 mmHg. The mean mitral valve gradient is 2.0 mmHg. Tricuspid Valve: The tricuspid valve is normal in structure. Tricuspid valve regurgitation is moderate. Aortic Valve: The aortic valve is grossly normal. Aortic valve regurgitation is not visualized. Aortic valve mean gradient measures 1.0 mmHg. Aortic valve peak gradient measures 2.7 mmHg. Aortic valve area, by VTI measures 2.06 cm. Pulmonic Valve: The pulmonic valve was normal in structure. Pulmonic valve regurgitation is mild to moderate. Aorta: The ascending aorta was not well visualized. IAS/Shunts: No atrial level shunt detected by color flow Doppler. Additional Comments: Severely dilated systolic cardiomyopathy.  LEFT VENTRICLE PLAX 2D LVIDd:  6.10 cm      Diastology LVIDs:         5.50 cm      LV e' medial:    6.20 cm/s LV PW:         1.20 cm      LV E/e' medial:  15.0 LV IVS:        0.90 cm      LV e' lateral:   6.64 cm/s LVOT diam:     2.00 cm      LV E/e' lateral: 14.0 LV SV:         22 LV SV Index:   10 LVOT Area:     3.14 cm  LV Volumes (MOD) LV vol d, MOD A2C: 144.0 ml LV vol d, MOD A4C: 166.0 ml LV vol s, MOD A2C: 121.0 ml LV vol s, MOD A4C: 144.0 ml LV SV MOD A2C:     23.0 ml LV SV MOD A4C:     166.0 ml LV SV MOD BP:      25.0 ml RIGHT VENTRICLE RV Basal diam:  3.00 cm TAPSE (M-mode): 1.4 cm LEFT ATRIUM             Index       RIGHT ATRIUM           Index LA diam:        4.70 cm 2.06 cm/m  RA Area:     19.80 cm LA Vol (A2C):   49.4 ml 21.61 ml/m RA Volume:   64.80 ml  28.34 ml/m LA Vol (A4C):   59.9 ml 26.20 ml/m LA Biplane Vol: 57.7  ml 25.24 ml/m  AORTIC VALVE                   PULMONIC VALVE AV Area (Vmax):    2.02 cm    PV Vmax:       0.60 m/s AV Area (Vmean):   2.06 cm    PV Vmean:      42.700 cm/s AV Area (VTI):     2.06 cm    PV VTI:        0.094 m AV Vmax:           82.70 cm/s  PV Peak grad:  1.5 mmHg AV Vmean:          55.000 cm/s PV Mean grad:  1.0 mmHg AV VTI:            0.107 m AV Peak Grad:      2.7 mmHg AV Mean Grad:      1.0 mmHg LVOT Vmax:         53.30 cm/s LVOT Vmean:        36.000 cm/s LVOT VTI:          0.070 m LVOT/AV VTI ratio: 0.66  AORTA Ao Root diam: 3.10 cm MITRAL VALVE               TRICUSPID VALVE MV Area (PHT): 5.06 cm    TR Peak grad:   19.4 mmHg MV Area VTI:   2.33 cm    TR Vmax:        220.00 cm/s MV Peak grad:  4.0 mmHg MV Mean grad:  2.0 mmHg    SHUNTS MV Vmax:       1.00 m/s    Systemic VTI:  0.07 m MV Vmean:      62.9 cm/s   Systemic Diam: 2.00 cm MV Decel Time: 150  msec MV E velocity: 93.00 cm/s Alwyn Pea MD Electronically signed by Alwyn Pea MD Signature Date/Time: 07/05/2021/3:26:01 PM    Final    [place] EKG SITE RITE  Result Date: 07/06/2021 If Site Rite image not attached, placement could not be confirmed due to current cardiac rhythm.     Subjective: Has no complaints. Denies cp or sob  Discharge Exam: Vitals:   07/12/21 0900 07/12/21 1204  BP:  120/77  Pulse:  74  Resp: 20 16  Temp:  97.9 F (36.6 C)  SpO2:  99%   Vitals:   07/12/21 0631 07/12/21 0807 07/12/21 0900 07/12/21 1204  BP:  120/87  120/77  Pulse:  (!) 112  74  Resp:  18 20 16   Temp:  98.1 F (36.7 C)  97.9 F (36.6 C)  TempSrc:  Oral  Oral  SpO2:  97%  99%  Weight: 96.7 kg     Height:        General: Pt is alert, awake, not in acute distress Cardiovascular: RRR, S1/S2 +, no rubs, no gallops Respiratory: CTA bilaterally, no wheezing, no rhonchi Abdominal: Soft, NT, ND, bowel sounds + Extremities: no edema   The results of significant diagnostics from this hospitalization (including  imaging, microbiology, ancillary and laboratory) are listed below for reference.     Microbiology: Recent Results (from the past 240 hour(s))  Resp Panel by RT-PCR (Flu A&B, Covid) Nasopharyngeal Swab     Status: None   Collection Time: 07/04/21  9:38 PM   Specimen: Nasopharyngeal Swab; Nasopharyngeal(NP) swabs in vial transport medium  Result Value Ref Range Status   SARS Coronavirus 2 by RT PCR NEGATIVE NEGATIVE Final    Comment: (NOTE) SARS-CoV-2 target nucleic acids are NOT DETECTED.  The SARS-CoV-2 RNA is generally detectable in upper respiratory specimens during the acute phase of infection. The lowest concentration of SARS-CoV-2 viral copies this assay can detect is 138 copies/mL. A negative result does not preclude SARS-Cov-2 infection and should not be used as the sole basis for treatment or other patient management decisions. A negative result may occur with  improper specimen collection/handling, submission of specimen other than nasopharyngeal swab, presence of viral mutation(s) within the areas targeted by this assay, and inadequate number of viral copies(<138 copies/mL). A negative result must be combined with clinical observations, patient history, and epidemiological information. The expected result is Negative.  Fact Sheet for Patients:  BloggerCourse.com  Fact Sheet for Healthcare Providers:  SeriousBroker.it  This test is no t yet approved or cleared by the [place] FDA and  has been authorized for detection and/or diagnosis of SARS-CoV-2 by FDA under an Emergency Use Authorization (EUA). This EUA will remain  in effect (meaning this test can be used) for the duration of the COVID-19 declaration under Section 564(b)(1) of the Act, 21 U.S.C.section 360bbb-3(b)(1), unless the authorization is terminated  or revoked sooner.       Influenza A by PCR NEGATIVE NEGATIVE Final   Influenza B by PCR NEGATIVE  NEGATIVE Final    Comment: (NOTE) The Xpert Xpress SARS-CoV-2/FLU/RSV plus assay is intended as an aid in the diagnosis of influenza from Nasopharyngeal swab specimens and should not be used as a sole basis for treatment. Nasal washings and aspirates are unacceptable for Xpert Xpress SARS-CoV-2/FLU/RSV testing.  Fact Sheet for Patients: BloggerCourse.com  Fact Sheet for Healthcare Providers: SeriousBroker.it  This test is not yet approved or cleared by the [place] FDA and has been authorized for detection  and/or diagnosis of SARS-CoV-2 by FDA under an Emergency Use Authorization (EUA). This EUA will remain in effect (meaning this test can be used) for the duration of the COVID-19 declaration under Section 564(b)(1) of the Act, 21 U.S.C. section 360bbb-3(b)(1), unless the authorization is terminated or revoked.  Performed at [facility], [address] [place], [place] [address]   Culture, blood (routine x 2)     Status: None   Collection Time: 07/04/21 10:01 PM   Specimen: BLOOD  Result Value Ref Range Status   Specimen Description BLOOD BLOOD RIGHT HAND  Final   Special Requests   Final    BOTTLES DRAWN AEROBIC AND ANAEROBIC Blood Culture adequate volume   Culture   Final    NO GROWTH 5 DAYS Performed at [facility], [address] [place], [place] [address]    Report Status 07/09/2021 FINAL  Final  Culture, blood (routine x 2)     Status: None   Collection Time: 07/04/21 10:01 PM   Specimen: BLOOD  Result Value Ref Range Status   Specimen Description BLOOD RIGHT ANTECUBITAL  Final   Special Requests   Final    BOTTLES DRAWN AEROBIC AND ANAEROBIC Blood Culture adequate volume   Culture   Final    NO GROWTH 5 DAYS Performed at [facility], [address] [place], [place] [address]    Report Status 07/09/2021 FINAL  Final  Urine Culture     Status: None   Collection Time:  07/04/21 11:30 PM   Specimen: Urine, Catheterized  Result Value Ref Range Status   Specimen Description   Final    URINE, CATHETERIZED Performed at [facility], [address] [place], [place] [address]    Special Requests   Final    NONE Performed at [facility], [address] [place], [place] [address]    Culture   Final    NO GROWTH Performed at [facility], [address] [place], [place] [address]    Report Status 07/06/2021 FINAL  Final  MRSA Next Gen by PCR, Nasal     Status: None   Collection Time: 07/05/21  2:15 AM   Specimen: Nasal Mucosa; Nasal Swab  Result Value Ref Range Status   MRSA by PCR Next Gen NOT DETECTED NOT DETECTED Final    Comment: (NOTE) The GeneXpert MRSA Assay (FDA approved for NASAL specimens only), is one component of a comprehensive MRSA colonization surveillance program. It is not intended to diagnose MRSA infection nor to guide or monitor treatment for MRSA infections. Test performance is not FDA approved in patients less than 51 years old. Performed at [facility], [address] [place], [place] [address]      Labs: BNP (last 3 results) Recent Labs    07/04/21 1942 07/05/21 0546 07/06/21 0837  BNP 1,128.3* 1,356.8* 1,430.3*   Basic Metabolic Panel: Recent Labs  Lab 07/08/21 0505 07/09/21 0412 07/10/21 0120 07/11/21 0846 07/12/21 0515  NA 138 138 136 138 138  K 3.8 3.7 4.0 3.7 3.9  CL 103 99 98 96* 99  CO2 30 30 28 28 30   GLUCOSE 86 102* 237* 233* 126*  BUN 12 13 13 10 9   CREATININE 0.65 0.75 0.96 0.68 0.70  CALCIUM 8.7* 8.7* 8.5* 9.2 8.8*  MG 1.7 1.8 1.7 2.1 2.1  PHOS 3.8 4.0 3.8 3.7 4.3   Liver Function Tests: No results for input(s): AST, ALT, ALKPHOS, BILITOT, PROT, ALBUMIN in the  last 168 hours. No results for input(s): LIPASE, AMYLASE in the last 168 hours. No results for input(s): AMMONIA in the last 168 hours. CBC: Recent Labs  Lab 07/06/21 0725 07/07/21 0540  07/08/21 0505 07/09/21 0412 07/10/21 0120 07/11/21 0846  WBC 15.3* 13.7* 12.2* 10.1 18.0* 14.5*  NEUTROABS 11.8* 9.8*  --   --   --   --   HGB 12.0 11.1* 11.2* 10.5* 11.5* 11.0*  HCT 36.9 34.5* 33.7* 31.9* 34.8* 33.3*  MCV 91.1 92.7 89.2 89.9 90.2 90.2  PLT 320 338 347 350 354 340   Cardiac Enzymes: No results for input(s): CKTOTAL, CKMB, CKMBINDEX, TROPONINI in the last 168 hours. BNP: Invalid input(s): POCBNP CBG: Recent Labs  Lab 07/11/21 1931 07/11/21 2207 07/11/21 2312 07/12/21 0808 07/12/21 1203  GLUCAP 192* 176* 164* 141* 128*   D-Dimer No results for input(s): DDIMER in the last 72 hours. Hgb A1c No results for input(s): HGBA1C in the last 72 hours. Lipid Profile No results for input(s): CHOL, HDL, LDLCALC, TRIG, CHOLHDL, LDLDIRECT in the last 72 hours. Thyroid function studies No results for input(s): TSH, T4TOTAL, T3FREE, THYROIDAB in the last 72 hours.  Invalid input(s): FREET3 Anemia work up No results for input(s): VITAMINB12, FOLATE, FERRITIN, TIBC, IRON, RETICCTPCT in the last 72 hours. Urinalysis    Component Value Date/Time   COLORURINE YELLOW (A) 07/04/2021 2330   APPEARANCEUR HAZY (A) 07/04/2021 2330   LABSPEC >1.046 (H) 07/04/2021 2330   PHURINE 5.0 07/04/2021 2330   GLUCOSEU 50 (A) 07/04/2021 2330   HGBUR NEGATIVE 07/04/2021 2330   BILIRUBINUR NEGATIVE 07/04/2021 2330   KETONESUR NEGATIVE 07/04/2021 2330   PROTEINUR NEGATIVE 07/04/2021 2330   NITRITE NEGATIVE 07/04/2021 2330   LEUKOCYTESUR LARGE (A) 07/04/2021 2330   Sepsis Labs Invalid input(s): PROCALCITONIN,  WBC,  LACTICIDVEN Microbiology Recent Results (from the past 240 hour(s))  Resp Panel by RT-PCR (Flu A&B, Covid) Nasopharyngeal Swab     Status: None   Collection Time: 07/04/21  9:38 PM   Specimen: Nasopharyngeal Swab; Nasopharyngeal(NP) swabs in vial transport medium  Result Value Ref Range Status   SARS Coronavirus 2 by RT PCR NEGATIVE NEGATIVE Final    Comment:  (NOTE) SARS-CoV-2 target nucleic acids are NOT DETECTED.  The SARS-CoV-2 RNA is generally detectable in upper respiratory specimens during the acute phase of infection. The lowest concentration of SARS-CoV-2 viral copies this assay can detect is 138 copies/mL. A negative result does not preclude SARS-Cov-2 infection and should not be used as the sole basis for treatment or other patient management decisions. A negative result may occur with  improper specimen collection/handling, submission of specimen other than nasopharyngeal swab, presence of viral mutation(s) within the areas targeted by this assay, and inadequate number of viral copies(<138 copies/mL). A negative result must be combined with clinical observations, patient history, and epidemiological information. The expected result is Negative.  Fact Sheet for Patients:  BloggerCourse.com  Fact Sheet for Healthcare Providers:  SeriousBroker.it  This test is no t yet approved or cleared by the [place] FDA and  has been authorized for detection and/or diagnosis of SARS-CoV-2 by FDA under an Emergency Use Authorization (EUA). This EUA will remain  in effect (meaning this test can be used) for the duration of the COVID-19 declaration under Section 564(b)(1) of the Act, 21 U.S.C.section 360bbb-3(b)(1), unless the authorization is terminated  or revoked sooner.       Influenza A by PCR NEGATIVE NEGATIVE Final   Influenza B by PCR NEGATIVE NEGATIVE  Final    Comment: (NOTE) The Xpert Xpress SARS-CoV-2/FLU/RSV plus assay is intended as an aid in the diagnosis of influenza from Nasopharyngeal swab specimens and should not be used as a sole basis for treatment. Nasal washings and aspirates are unacceptable for Xpert Xpress SARS-CoV-2/FLU/RSV testing.  Fact Sheet for Patients: BloggerCourse.com  Fact Sheet for Healthcare  Providers: SeriousBroker.it  This test is not yet approved or cleared by the [place] FDA and has been authorized for detection and/or diagnosis of SARS-CoV-2 by FDA under an Emergency Use Authorization (EUA). This EUA will remain in effect (meaning this test can be used) for the duration of the COVID-19 declaration under Section 564(b)(1) of the Act, 21 U.S.C. section 360bbb-3(b)(1), unless the authorization is terminated or revoked.  Performed at [facility], [address] [place], [place] [address]   Culture, blood (routine x 2)     Status: None   Collection Time: 07/04/21 10:01 PM   Specimen: BLOOD  Result Value Ref Range Status   Specimen Description BLOOD BLOOD RIGHT HAND  Final   Special Requests   Final    BOTTLES DRAWN AEROBIC AND ANAEROBIC Blood Culture adequate volume   Culture   Final    NO GROWTH 5 DAYS Performed at [facility], [address] [place], [place] [address]    Report Status 07/09/2021 FINAL  Final  Culture, blood (routine x 2)     Status: None   Collection Time: 07/04/21 10:01 PM   Specimen: BLOOD  Result Value Ref Range Status   Specimen Description BLOOD RIGHT ANTECUBITAL  Final   Special Requests   Final    BOTTLES DRAWN AEROBIC AND ANAEROBIC Blood Culture adequate volume   Culture   Final    NO GROWTH 5 DAYS Performed at [facility], [address] [place], [place] [address]    Report Status 07/09/2021 FINAL  Final  Urine Culture     Status: None   Collection Time: 07/04/21 11:30 PM   Specimen: Urine, Catheterized  Result Value Ref Range Status   Specimen Description   Final    URINE, CATHETERIZED Performed at [facility], [address] [place], [place] [address]    Special Requests   Final    NONE Performed at [facility], [address], [place], [place] [address]    Culture   Final    NO GROWTH Performed at [facility], [address], [place], [place] [address]    Report Status 07/06/2021 FINAL  Final  MRSA Next Gen by PCR, Nasal     Status: None   Collection Time: 07/05/21  2:15 AM   Specimen: Nasal Mucosa; Nasal Swab  Result Value Ref Range Status   MRSA by PCR Next Gen NOT DETECTED NOT DETECTED Final    Comment: (NOTE) The GeneXpert MRSA Assay (FDA approved for NASAL specimens only), is one component of a comprehensive MRSA colonization surveillance program. It is not intended to diagnose MRSA infection nor to guide or monitor treatment for MRSA infections. Test performance is not FDA approved in patients less than 66 years old. Performed at [facility], [address], [place], [place] [address]      Time coordinating discharge: Over 30 minutes  SIGNED:   Lynn Ito, MD  [facility] 07/12/2021, 2:01 PM Pager   If 7PM-7AM, please contact night-coverage [email or web address] Password [email or web address]

## 2021-07-12 NOTE — Progress Notes (Signed)
PT Cancellation Note  Patient Details Name: Dynisha Due MRN: [id number] DOB: 1971-07-20   Cancelled Treatment:    Reason Eval/Treat Not Completed: Other (comment): Consult received, pt chart reviewed. Pt received side lying in bed, resting. Pt currently refusing to participate stating she is "too tired". PT explained that delaying PT evaluation could delay pt discharge. Pt remained unagreeable. Multiple attempts made while in room. Will attempt again this afternoon.   Basilia Jumbo PT, DPT 07/12/21 11:05 AM [phone]  Lavenia Atlas 07/12/2021, 11:02 AM

## 2021-07-12 NOTE — Evaluation (Addendum)
Occupational Therapy Evaluation Patient Details Name: Martha Ramos MRN: [id number] DOB: 07/15/1971 Today's Date: 07/12/2021    History of Present Illness Pt. is a 50 y.o. female who was admitted to [facility] with chest pain, tighness, dyspnea, and was diagnosed with a Pulmonary Embolism. PMHx includes: asthma, type 2 diabetes mellitus, hypertension and coronary artery disease.   Clinical Impression   Pt. presents with 7/10 pain, weakness, limited activity tolerance, and limited functional mobility which hinders his ability to complete basic ADL and IADL functioning. Pt. resides at home with  he children. Pt. required assist with ADL, and IADL tasks from her daughter. Pt. required assist with morning ADL care, meal preparation, medication management, and performing home management, and household tasks. Pt. Does not drive.  Pt. reports having 7/10 pain in her bilateral LEs/feet. Pt. reports planning to return home this afternoon. Pt. Could benefit from OT services for ADL training, A/E training, and pt. Education about energy conservation, work simplification techniques, home modification, and DME. Pt. Plans to return home upon discharge with family to assist pt. as needed. Pt. Could benefit from follow-up HHOT services upon discharge.     Follow Up Recommendations  Home health OT    Equipment Recommendations      None Recommended  Recommendations for Other Services       Precautions / Restrictions Precautions Precautions: Fall Restrictions Weight Bearing Restrictions: No      Mobility Bed Mobility Overal bed mobility: Modified Independent             General bed mobility comments: Using bed rails, increased time    Transfers  Deferred     Balance                            ADL either performed or assessed with clinical judgement   ADL Overall ADL's : Needs assistance/impaired                                       General ADL Comments:  Pt. requires assist with bathing, dressing, and toilting.     Vision         Perception     Praxis      Pertinent Vitals/Pain Pain Assessment: 0-10 Pain Score: 7  Pain Location: Bilateral feet Pain Descriptors / Indicators: Aching;Sore Pain Intervention(s): Limited activity within patient's tolerance;Monitored during session     Hand Dominance Right   Extremity/Trunk Assessment Upper Extremity Assessment Upper Extremity Assessment: Overall WFL for tasks assessed     Communication Communication Communication: No difficulties   Cognition Arousal/Alertness: Awake/alert (fatigued) Behavior During Therapy: WFL for tasks assessed/performed Overall Cognitive Status: Within Functional Limits for tasks assessed                                 General Comments: A&Ox4   General Comments       Exercises    Shoulder Instructions      Home Living Family/patient expects to be discharged to:: Private residence Living Arrangements: Children Available Help at Discharge: Family Type of Home: House Home Access: Stairs to enter Secretary/administrator of Steps: 1 Entrance Stairs-Rails: None Home Layout: One level     Bathroom Shower/Tub: Chief Strategy Officer: Standard     Home Equipment: Environmental consultant - 2 wheels;Bedside commode;Shower seat  Additional Comments: Uses RW at baseline.      Prior Functioning/Environment Level of Independence: Needs assistance  Gait / Transfers Assistance Needed: daughter present at all times ADL's / Homemaking Assistance Needed: Daughter assists pt. with dressing/bathing/toileting, meal preparation, and medication management.   Comments: Ambulates short distances. Lives with 3 children between the ages of 5-27. Children are avaialble to assist with caregiving. Pt does not drive.        OT Problem List: Decreased strength;Pain;Decreased activity tolerance;Impaired UE functional use;Decreased knowledge of use of DME  or AE      OT Treatment/Interventions: Self-care/ADL training    OT Goals(Current goals can be found in the care plan section) Acute Rehab OT Goals Patient Stated Goal: To go home OT Goal Formulation: With patient Time For Goal Achievement: 07/25/21 Potential to Achieve Goals: Good  OT Frequency: Min 2X/week   Barriers to D/C:            Co-evaluation              AM-PAC OT "6 Clicks" Daily Activity     Outcome Measure Help from another person eating meals?: None Help from another person taking care of personal grooming?: A Little Help from another person toileting, which includes using toliet, bedpan, or urinal?: A Little Help from another person bathing (including washing, rinsing, drying)?: A Lot Help from another person to put on and taking off regular upper body clothing?: A Little Help from another person to put on and taking off regular lower body clothing?: A Little 6 Click Score: 18   End of Session    Activity Tolerance: Patient limited by fatigue Patient left: in bed;with call bell/phone within reach  OT Visit Diagnosis: Muscle weakness (generalized) (M62.81)                Time: 8341-9622 OT Time Calculation (min): 5 min Charges:  OT General Charges $OT Visit: 1 Visit OT Evaluation $OT Eval Low Complexity: 1 Low  Olegario Messier, MS, OTR/L   Olegario Messier 07/12/2021, 3:02 PM

## 2021-07-12 NOTE — Plan of Care (Signed)
Pt has reported no further chest pain during the night.   Hilton Sinclair BSN RN CMSRN   Problem: Pain Managment: Goal: General experience of comfort will improve Outcome: Not Progressing

## 2021-07-12 NOTE — TOC Progression Note (Signed)
Transition of Care [facility]) - Progression Note    Patient Details  Name: Martha Ramos MRN: [id number] Date of Birth: Apr 20, 1971  Transition of Care [facility]) CM/SW Contact  Maree Krabbe, LCSW Phone Number: 07/12/2021, 2:52 PM  Clinical Narrative:  Scale provided to pt at bedside.     Expected Discharge Plan: Home w Home Health Services Barriers to Discharge: No Barriers Identified  Expected Discharge Plan and Services Expected Discharge Plan: Home w Home Health Services In-house Referral: Clinical Social Work   Post Acute Care Choice: Home Health Living arrangements for the past 2 months: Single Family Home Expected Discharge Date: 07/12/21                         HH Arranged: RN, PT, OT HH Agency: [facility] Date HH Agency Contacted: 07/12/21 Time HH Agency Contacted: [phone] Representative spoke with at [facility] Agency: Barbara Cower   Social Determinants of Health (SDOH) Interventions    Readmission Risk Interventions No flowsheet data found.

## 2021-07-12 NOTE — Progress Notes (Signed)
   Heart Failure Nurse Navigator Note  HFrEF   Met briefly with patient today, she is being discharged and states that she does not have any further questions.  Discussed, low-sodium diet, fluid restriction and weighing daily.  She states that she does not have a scale and will not be able to purchase one for a while.  I have contacted [facility] to see if they could supply her with a scale before discharge.  She has follow-up appointment in the heart failure clinic on August 2.  Pricilla Riffle RN CHFN

## 2021-07-12 NOTE — Progress Notes (Signed)
Discharge instructions given to and reviewed with patient and her daughter Manson Passey. Patient and daughter verbalized understanding of all discharge instructions including new prescriptions, changes to home medication list, follow up appointments and when to call the doctor. Discussed in length how and when to take eliquis including doses and for how long. Patient left room alert with no distress noted by wheelchair with volunteer. Scale and shower chair sent with patient. Daughter driving patient home.

## 2021-07-23 ENCOUNTER — Ambulatory Visit: Payer: Medicare Other | Admitting: Family

## 2021-07-29 NOTE — Progress Notes (Signed)
Patient ID: Martha Ramos, female    DOB: 1971-09-16, 50 y.o.   MRN: [id number]  HPI  [name] is a 50 y/o female with a history of asthma, CAD, DM, HTN, bipolar and chronic heart failure.   Echo report from 07/05/21 reviewed and showed an EF of <20% along with moderate MR.   LHC done 07/09/21 showed: Angiographically normal coronary arteries Normal LVEDP Systemic hypotension  Admitted 07/04/21 due to chest pain and shortness of breath. Cardiology consult obtained. CTA showed pulmonary embolus. Initially given IV lasix with transition to oral diuretics. Antibiotics given for possible pneumonia. Elevated troponin thought to be due to PE. Initially placed on heparin drip and then placed on eliquis. Cath completed with normal coronaries. Discharged after 8 days.   She presents today for her initial visit with a chief complaint of moderate shortness of breath with little exertion. She describes this as having been present for several months. She has associated fatigue, decreased appetite and light-headedness along with this. She denies any abdominal distention, palpitations, pedal edema, chest pain or cough.   Past Medical History:  Diagnosis Date   Asthma    CAD (coronary artery disease)    Diabetes (HCC)    Hypertension    Past Surgical History:  Procedure Laterality Date   LEFT HEART CATH AND CORONARY ANGIOGRAPHY N/A 07/09/2021   Procedure: LEFT HEART CATH AND CORONARY ANGIOGRAPHY;  Surgeon: Tonny Bollman, MD;  Location: [facility] INVASIVE CV LAB;  Service: Cardiovascular;  Laterality: N/A;   Family History  Problem Relation Age of Onset   Heart failure Mother    Alcohol abuse Father    Social History   Tobacco Use   Smoking status: Former    Types: Cigarettes   Smokeless tobacco: Never  Substance Use Topics   Alcohol use: Not Currently   No Known Allergies Prior to Admission medications   Medication Sig Start Date End Date Taking? Authorizing Provider  amiodarone  (PACERONE) 200 MG tablet Take 1 tablet (200 mg total) by mouth daily. 07/13/21 08/12/21 Yes Lynn Ito, MD  apixaban (ELIQUIS) 5 MG TABS tablet Take 1 tablet (5 mg total) by mouth 2 (two) times daily for 23 days. Specifically: Take 10mg  twice daily (7/22 PM-7/27 PM); then start 5mg  twice daily dosing (7/28 AM). 07/18/21 08/10/21 Yes 07/20/21, MD  digoxin (LANOXIN) 0.25 MG tablet Take 1 tablet (0.25 mg total) by mouth daily. 07/13/21 08/12/21 Yes 07/15/21, MD  glipiZIDE (GLUCOTROL) 5 MG tablet Take 5 mg by mouth 2 (two) times daily. 04/17/21  Yes [provider]  haloperidol (HALDOL) 10 MG tablet Take 10 mg by mouth at bedtime. 05/15/21  Yes [provider]  losartan (COZAAR) 25 MG tablet Take 1 tablet (25 mg total) by mouth daily. 07/13/21 08/12/21 Yes 07/15/21, MD  metoprolol succinate (TOPROL-XL) 25 MG 24 hr tablet Take 0.5 tablets (12.5 mg total) by mouth daily. 07/13/21 08/12/21 Yes 07/15/21, MD  midodrine (PROAMATINE) 10 MG tablet Take 1 tablet (10 mg total) by mouth 3 (three) times daily with meals. 07/12/21 08/11/21 Yes 07/14/21, MD  omeprazole (PRILOSEC) 20 MG capsule Take 20 mg by mouth 2 (two) times daily. 05/03/21  Yes [provider]  torsemide (DEMADEX) 20 MG tablet Take 1 tablet (20 mg total) by mouth daily. 07/13/21 08/12/21 Yes 07/15/21, MD  VENTOLIN HFA 108 (90 Base) MCG/ACT inhaler Inhale 1-2 puffs into the lungs every 4 (four) hours as needed. 04/17/21  Yes [provider]  atorvastatin (  LIPITOR) 20 MG tablet Take 1 tablet by mouth daily. Patient not taking: Reported on 07/30/2021    [provider]  DULoxetine (CYMBALTA) 60 MG capsule Take 60 mg by mouth daily. Patient not taking: Reported on 07/30/2021 05/21/21   [provider]  ivabradine (CORLANOR) 5 MG TABS tablet Take 1 tablet (5 mg total) by mouth 2 (two) times daily with a meal. 07/12/21 08/11/21  Lynn Ito, MD  K-PHOS 500 MG tablet Take 500 mg by mouth 2 (two)  times daily. Patient not taking: Reported on 07/30/2021 06/25/21   [provider]    Review of Systems  Constitutional:  Positive for appetite change (decreased) and fatigue (easily).  HENT:  Negative for congestion, postnasal drip and sore throat.   Eyes: Negative.   Respiratory:  Positive for shortness of breath (easily). Negative for cough.   Cardiovascular:  Negative for chest pain, palpitations and leg swelling.  Gastrointestinal:  Negative for abdominal distention and abdominal pain.  Endocrine: Negative.   Genitourinary: Negative.   Musculoskeletal:  Negative for back pain and neck pain.  Skin: Negative.   Allergic/Immunologic: Negative.   Neurological:  Positive for light-headedness. Negative for dizziness.  Hematological:  Negative for adenopathy. Does not bruise/bleed easily.  Psychiatric/Behavioral:  Positive for sleep disturbance (sleeping during the day). Negative for dysphoric mood. The patient is not nervous/anxious.    Vitals:   07/30/21 1354  BP: 103/74  Pulse: 99  Resp: 16  SpO2: 99%  Weight: 187 lb 8 oz (85 kg)  Height: 5\' 4"  (1.626 m)   Wt Readings from Last 3 Encounters:  07/30/21 187 lb 8 oz (85 kg)  07/12/21 213 lb 3 oz (96.7 kg)   Lab Results  Component Value Date   CREATININE 0.70 07/12/2021   CREATININE 0.68 07/11/2021   CREATININE 0.96 07/10/2021    Physical Exam Vitals and nursing note reviewed. Exam conducted with a chaperone present (daughter).  Constitutional:      Appearance: Normal appearance.  HENT:     Head: Normocephalic and atraumatic.  Cardiovascular:     Rate and Rhythm: Normal rate and regular rhythm.  Pulmonary:     Effort: Pulmonary effort is normal. No respiratory distress.     Breath sounds: No wheezing or rales.  Abdominal:     General: There is no distension.     Palpations: Abdomen is soft.  Musculoskeletal:        General: No tenderness.     Cervical back: Normal range of motion and neck supple.     Right  lower leg: No edema.     Left lower leg: No edema.  Skin:    General: Skin is warm and dry.  Neurological:     General: No focal deficit present.     Mental Status: She is alert and oriented to person, place, and time.  Psychiatric:        Mood and Affect: Affect is flat.        Thought Content: Thought content normal.     Assessment & Plan:  1: Chronic heart failure with reduced ejection fraction- - NYHA class III - euvolemic today - weighing daily; reminded to call for an overnight weight gain of > 2 pounds or a weekly weight gain of > 5 pounds - not adding salt although she says that her appetite has decreased; reviewed the importance of following a low sodium diet and low sodium cookbook was given to her - on GDMT of losartan and metoprolol -  unsure if BP could tolerate changing losartan to entresto or adding spironolactone; consider SGLT2 - will increase metoprolol to 25mg  daily due to HR in the 90's - sees cardiology ) 08/14/21 - BNP 07/06/21 was 1430.3  2: HTN- - BP looks good although on the low side (103/74) - saw PCP 07/08/21) 06/19/21 - BMP 07/12/21 reviewed and showed sodium 138, potassium 3.9, creatinine 0.7 and GFR >60  3: DM- - A1c 07/06/21 was 6.2%  4: Bipolar- - saw psychiatry 05/21/21   Patient did not bring her medications nor a list. Each medication was verbally reviewed with the patient and she was encouraged to bring the bottles to every visit to confirm accuracy of list.   Return in 6 weeks or sooner for any questions/problems before then.

## 2021-07-30 ENCOUNTER — Ambulatory Visit: Payer: Medicare Other | Attending: Family | Admitting: Family

## 2021-07-30 ENCOUNTER — Other Ambulatory Visit: Payer: Self-pay

## 2021-07-30 ENCOUNTER — Encounter: Payer: Self-pay | Admitting: Family

## 2021-07-30 VITALS — BP 103/74 | HR 99 | Resp 16 | Ht 64.0 in | Wt 187.5 lb

## 2021-07-30 DIAGNOSIS — R5383 Other fatigue: Secondary | ICD-10-CM | POA: Diagnosis not present

## 2021-07-30 DIAGNOSIS — I5022 Chronic systolic (congestive) heart failure: Secondary | ICD-10-CM | POA: Insufficient documentation

## 2021-07-30 DIAGNOSIS — Z8249 Family history of ischemic heart disease and other diseases of the circulatory system: Secondary | ICD-10-CM | POA: Insufficient documentation

## 2021-07-30 DIAGNOSIS — Z86711 Personal history of pulmonary embolism: Secondary | ICD-10-CM | POA: Diagnosis not present

## 2021-07-30 DIAGNOSIS — Z7901 Long term (current) use of anticoagulants: Secondary | ICD-10-CM | POA: Insufficient documentation

## 2021-07-30 DIAGNOSIS — I2510 Atherosclerotic heart disease of native coronary artery without angina pectoris: Secondary | ICD-10-CM | POA: Diagnosis not present

## 2021-07-30 DIAGNOSIS — I10 Essential (primary) hypertension: Secondary | ICD-10-CM

## 2021-07-30 DIAGNOSIS — E119 Type 2 diabetes mellitus without complications: Secondary | ICD-10-CM | POA: Diagnosis not present

## 2021-07-30 DIAGNOSIS — R42 Dizziness and giddiness: Secondary | ICD-10-CM | POA: Diagnosis not present

## 2021-07-30 DIAGNOSIS — J45909 Unspecified asthma, uncomplicated: Secondary | ICD-10-CM | POA: Insufficient documentation

## 2021-07-30 DIAGNOSIS — Z79899 Other long term (current) drug therapy: Secondary | ICD-10-CM | POA: Diagnosis not present

## 2021-07-30 DIAGNOSIS — F319 Bipolar disorder, unspecified: Secondary | ICD-10-CM

## 2021-07-30 DIAGNOSIS — R0602 Shortness of breath: Secondary | ICD-10-CM | POA: Insufficient documentation

## 2021-07-30 DIAGNOSIS — Z7984 Long term (current) use of oral hypoglycemic drugs: Secondary | ICD-10-CM | POA: Insufficient documentation

## 2021-07-30 DIAGNOSIS — Z87891 Personal history of nicotine dependence: Secondary | ICD-10-CM | POA: Diagnosis not present

## 2021-07-30 DIAGNOSIS — I110 Hypertensive heart disease with heart failure: Secondary | ICD-10-CM | POA: Diagnosis not present

## 2021-07-30 MED ORDER — METOPROLOL SUCCINATE ER 25 MG PO TB24: 25.0000 mg | ORAL_TABLET | Freq: Every day | ORAL | 5 refills | Status: DC

## 2021-07-30 NOTE — Patient Instructions (Addendum)
Continue weighing daily and call for an overnight weight gain of > 2 pounds or a weekly weight gain of >5 pounds.   Change your metoprolol to 1 whole tablet daily (equals to 25mg  daily)

## 2021-08-05 ENCOUNTER — Encounter: Payer: Self-pay | Admitting: Radiology

## 2021-08-05 ENCOUNTER — Emergency Department: Payer: Medicare Other

## 2021-08-05 ENCOUNTER — Other Ambulatory Visit: Payer: Self-pay

## 2021-08-05 ENCOUNTER — Inpatient Hospital Stay
Admission: EM | Admit: 2021-08-05 | Discharge: 2021-08-08 | DRG: 312 | Disposition: A | Discharge status: Home / Self Care | Payer: Medicare Other | Attending: Student | Admitting: Student

## 2021-08-05 DIAGNOSIS — I110 Hypertensive heart disease with heart failure: Secondary | ICD-10-CM | POA: Diagnosis present

## 2021-08-05 DIAGNOSIS — Z7984 Long term (current) use of oral hypoglycemic drugs: Secondary | ICD-10-CM

## 2021-08-05 DIAGNOSIS — R739 Hyperglycemia, unspecified: Secondary | ICD-10-CM

## 2021-08-05 DIAGNOSIS — Z86711 Personal history of pulmonary embolism: Secondary | ICD-10-CM

## 2021-08-05 DIAGNOSIS — I5082 Biventricular heart failure: Secondary | ICD-10-CM | POA: Diagnosis present

## 2021-08-05 DIAGNOSIS — I472 Ventricular tachycardia: Secondary | ICD-10-CM | POA: Diagnosis present

## 2021-08-05 DIAGNOSIS — N390 Urinary tract infection, site not specified: Secondary | ICD-10-CM

## 2021-08-05 DIAGNOSIS — I428 Other cardiomyopathies: Secondary | ICD-10-CM | POA: Diagnosis present

## 2021-08-05 DIAGNOSIS — I5022 Chronic systolic (congestive) heart failure: Secondary | ICD-10-CM

## 2021-08-05 DIAGNOSIS — R55 Syncope and collapse: Secondary | ICD-10-CM | POA: Diagnosis not present

## 2021-08-05 DIAGNOSIS — M25561 Pain in right knee: Secondary | ICD-10-CM | POA: Diagnosis present

## 2021-08-05 DIAGNOSIS — E1165 Type 2 diabetes mellitus with hyperglycemia: Secondary | ICD-10-CM | POA: Diagnosis present

## 2021-08-05 DIAGNOSIS — Z7901 Long term (current) use of anticoagulants: Secondary | ICD-10-CM

## 2021-08-05 DIAGNOSIS — Z20822 Contact with and (suspected) exposure to covid-19: Secondary | ICD-10-CM | POA: Diagnosis present

## 2021-08-05 DIAGNOSIS — Z8249 Family history of ischemic heart disease and other diseases of the circulatory system: Secondary | ICD-10-CM

## 2021-08-05 DIAGNOSIS — F259 Schizoaffective disorder, unspecified: Secondary | ICD-10-CM

## 2021-08-05 DIAGNOSIS — W19XXXA Unspecified fall, initial encounter: Secondary | ICD-10-CM | POA: Diagnosis present

## 2021-08-05 DIAGNOSIS — I5042 Chronic combined systolic (congestive) and diastolic (congestive) heart failure: Secondary | ICD-10-CM | POA: Diagnosis present

## 2021-08-05 DIAGNOSIS — Z79899 Other long term (current) drug therapy: Secondary | ICD-10-CM

## 2021-08-05 DIAGNOSIS — F319 Bipolar disorder, unspecified: Secondary | ICD-10-CM | POA: Diagnosis present

## 2021-08-05 DIAGNOSIS — I2510 Atherosclerotic heart disease of native coronary artery without angina pectoris: Secondary | ICD-10-CM | POA: Diagnosis present

## 2021-08-05 DIAGNOSIS — M25512 Pain in left shoulder: Secondary | ICD-10-CM | POA: Diagnosis present

## 2021-08-05 DIAGNOSIS — R4182 Altered mental status, unspecified: Secondary | ICD-10-CM

## 2021-08-05 DIAGNOSIS — Z87891 Personal history of nicotine dependence: Secondary | ICD-10-CM

## 2021-08-05 DIAGNOSIS — J45909 Unspecified asthma, uncomplicated: Secondary | ICD-10-CM

## 2021-08-05 LAB — URINALYSIS, COMPLETE (UACMP) WITH MICROSCOPIC
Bacteria, UA: NONE SEEN
Bilirubin Urine: NEGATIVE
Glucose, UA: >=500 mg/dL — AB
Hgb urine dipstick: NEGATIVE
Ketones, ur: NEGATIVE mg/dL
Nitrite: NEGATIVE
Protein, ur: NEGATIVE mg/dL
Specific Gravity, Urine: 1.014 (ref 1.005–1.030)
pH: 8 (ref 5.0–8.0)

## 2021-08-05 LAB — CBC WITH DIFFERENTIAL/PLATELET
Abs Immature Granulocytes: 0.04 10*3/uL (ref 0.00–0.07)
Basophils Absolute: 0.1 10*3/uL (ref 0.0–0.1)
Basophils Relative: 0 %
Eosinophils Absolute: 0.1 10*3/uL (ref 0.0–0.5)
Eosinophils Relative: 1 %
HCT: 40.3 % (ref 36.0–46.0)
Hemoglobin: 13.8 g/dL (ref 12.0–15.0)
Immature Granulocytes: 0 %
Lymphocytes Relative: 25 %
Lymphs Abs: 2.9 10*3/uL (ref 0.7–4.0)
MCH: 29.7 pg (ref 26.0–34.0)
MCHC: 34.2 g/dL (ref 30.0–36.0)
MCV: 86.9 fL (ref 80.0–100.0)
Monocytes Absolute: 0.9 10*3/uL (ref 0.1–1.0)
Monocytes Relative: 8 %
Neutro Abs: 7.6 10*3/uL (ref 1.7–7.7)
Neutrophils Relative %: 66 %
Platelets: 287 10*3/uL (ref 150–400)
RBC: 4.64 MIL/uL (ref 3.87–5.11)
RDW: 13 % (ref 11.5–15.5)
WBC: 11.5 10*3/uL — ABNORMAL HIGH (ref 4.0–10.5)
nRBC: 0 % (ref 0.0–0.2)

## 2021-08-05 LAB — COMPREHENSIVE METABOLIC PANEL
ALT: 53 U/L — ABNORMAL HIGH (ref 0–44)
AST: 47 U/L — ABNORMAL HIGH (ref 15–41)
Albumin: 3.6 g/dL (ref 3.5–5.0)
Alkaline Phosphatase: 92 U/L (ref 38–126)
Anion gap: 14 (ref 5–15)
BUN: 15 mg/dL (ref 6–20)
CO2: 28 mmol/L (ref 22–32)
Calcium: 9.4 mg/dL (ref 8.9–10.3)
Chloride: 89 mmol/L — ABNORMAL LOW (ref 98–111)
Creatinine, Ser: 0.99 mg/dL (ref 0.44–1.00)
GFR, Estimated: >60 mL/min (ref >60)
Glucose, Bld: 560 mg/dL (ref 70–99)
Potassium: 4.2 mmol/L (ref 3.5–5.1)
Sodium: 131 mmol/L — ABNORMAL LOW (ref 135–145)
Total Bilirubin: 0.7 mg/dL (ref 0.3–1.2)
Total Protein: 7.5 g/dL (ref 6.5–8.1)

## 2021-08-05 LAB — BLOOD GAS, VENOUS
Acid-Base Excess: 10.6 mmol/L — ABNORMAL HIGH (ref 0.0–2.0)
Bicarbonate: 34.2 mmol/L — ABNORMAL HIGH (ref 20.0–28.0)
O2 Saturation: 77.5 %
Patient temperature: 37
pCO2, Ven: 40 mmHg — ABNORMAL LOW (ref 44.0–60.0)
pH, Ven: 7.54 — ABNORMAL HIGH (ref 7.250–7.430)
pO2, Ven: 36 mmHg (ref 32.0–45.0)

## 2021-08-05 LAB — TROPONIN I (HIGH SENSITIVITY): Troponin I (High Sensitivity): 20 ng/L — ABNORMAL HIGH (ref <18)

## 2021-08-05 LAB — MAGNESIUM: Magnesium: 2.1 mg/dL (ref 1.7–2.4)

## 2021-08-05 MED ORDER — INSULIN ASPART 100 UNIT/ML IJ SOLN
0.0000 [IU] | INTRAMUSCULAR | Status: DC
  Administered 2021-08-06: 3 [IU] via SUBCUTANEOUS
  Administered 2021-08-06: 15 [IU] via SUBCUTANEOUS
  Administered 2021-08-06: 2 [IU] via SUBCUTANEOUS
  Administered 2021-08-06: 5 [IU] via SUBCUTANEOUS
  Administered 2021-08-06: 11 [IU] via SUBCUTANEOUS
  Administered 2021-08-06 – 2021-08-07 (×2): 5 [IU] via SUBCUTANEOUS
  Administered 2021-08-07: 11 [IU] via SUBCUTANEOUS
  Administered 2021-08-07: 3 [IU] via SUBCUTANEOUS
  Administered 2021-08-07: 11 [IU] via SUBCUTANEOUS
  Administered 2021-08-08: 3 [IU] via SUBCUTANEOUS
  Administered 2021-08-08: 11 [IU] via SUBCUTANEOUS
  Administered 2021-08-08: 5 [IU] via SUBCUTANEOUS
  Administered 2021-08-08: 8 [IU] via SUBCUTANEOUS
  Filled 2021-08-05 (×14): qty 1

## 2021-08-05 MED ORDER — LACTATED RINGERS IV BOLUS
1000.0000 mL | Freq: Once | INTRAVENOUS | Status: AC
  Administered 2021-08-05: 1000 mL via INTRAVENOUS

## 2021-08-05 MED ORDER — SODIUM CHLORIDE 0.9 % IV SOLN
1.0000 g | Freq: Once | INTRAVENOUS | Status: AC
  Administered 2021-08-06: 1 g via INTRAVENOUS
  Filled 2021-08-05: qty 10

## 2021-08-05 NOTE — ED Notes (Signed)
Selinda Flavin ( daughter) [phone]  ** call with any updates or changes **

## 2021-08-05 NOTE — ED Provider Notes (Signed)
[facility] Emergency Department Provider Note   ____________________________________________   Event Date/Time   First MD Initiated Contact with Patient 08/05/21 2026     (approximate)  I have reviewed the triage vital signs and the nursing notes.   HISTORY  Chief Complaint Altered Mental Status    HPI Martha Ramos is a 50 y.o. female with past medical history of hypertension, diabetes, CAD, asthma, CHF, PE on Eliquis, ventricular tachycardia, and bipolar disorder who presents to the ED for altered mental status.  History is limited as patient appears disoriented, EMS was called by patient's daughter due to confusion starting this evening.  Patient complains of pain all over, when asked to say where the pain is the worst, she complains of her tailbone, bilateral knees, and bilateral shoulders.  She denies any fevers, cough, chest pain, shortness of breath, vomiting, or diarrhea.  She does state that her abdomen hurts because she needs to go to the bathroom, does endorse dysuria but denies flank pain or hematuria.  EMS was told by daughter that patient was recently diagnosed with a UTI, they are unsure if she is taking antibiotics.  Patient noted to have glucose reading of "high" by EMS, patient reports being compliant with her diabetic regimen.        Past Medical History:  Diagnosis Date   Asthma    CAD (coronary artery disease)    CHF (congestive heart failure) (HCC)    Diabetes (HCC)    Hypertension     Patient Active Problem List   Diagnosis Date Noted   Cardiomyopathy [facility])    RVF (right ventricular failure) (HCC) 07/07/2021   Acute on chronic systolic CHF (congestive heart failure) (HCC) 07/06/2021   Acute on chronic combined systolic (congestive) and diastolic (congestive) heart failure (HCC)    Cardiogenic shock (HCC)    Acute on chronic diastolic CHF (congestive heart failure) (HCC) 07/05/2021   Uncontrolled type 2 diabetes mellitus  with hyperglycemia (HCC) 07/05/2021   Acute pulmonary embolism (HCC) 07/04/2021    Past Surgical History:  Procedure Laterality Date   LEFT HEART CATH AND CORONARY ANGIOGRAPHY N/A 07/09/2021   Procedure: LEFT HEART CATH AND CORONARY ANGIOGRAPHY;  Surgeon: Tonny Bollman, MD;  Location: [facility] INVASIVE CV LAB;  Service: Cardiovascular;  Laterality: N/A;    Prior to Admission medications   Medication Sig Start Date End Date Taking? Authorizing Provider  acetaminophen (TYLENOL) 650 MG CR tablet Take 650 mg by mouth every 8 (eight) hours as needed for pain.   Yes [provider]  amiodarone (PACERONE) 200 MG tablet Take 1 tablet (200 mg total) by mouth daily. 07/13/21 08/12/21 Yes Lynn Ito, MD  apixaban (ELIQUIS) 5 MG TABS tablet Take 1 tablet (5 mg total) by mouth 2 (two) times daily for 23 days. Specifically: Take 10mg  twice daily (7/22 PM-7/27 PM); then start 5mg  twice daily dosing (7/28 AM). 07/18/21 08/10/21 Yes 07/20/21, MD  cephALEXin (KEFLEX) 500 MG capsule Take 500 mg by mouth 4 (four) times daily.   Yes [provider]  digoxin (LANOXIN) 0.25 MG tablet Take 1 tablet (0.25 mg total) by mouth daily. 07/13/21 08/12/21 Yes 07/15/21, MD  DULoxetine (CYMBALTA) 60 MG capsule Take 60 mg by mouth daily. 05/21/21  Yes [provider]  glipiZIDE (GLUCOTROL) 5 MG tablet Take 5 mg by mouth 2 (two) times daily. 04/17/21  Yes [provider]  haloperidol (HALDOL) 10 MG tablet Take 10 mg by mouth at bedtime. 05/15/21  Yes [provider]  losartan (COZAAR) 25 MG tablet Take 1 tablet (25 mg total) by mouth daily. 07/13/21 08/12/21 Yes Lynn Ito, MD  metoprolol succinate (TOPROL-XL) 25 MG 24 hr tablet Take 1 tablet (25 mg total) by mouth daily. 07/30/21  Yes Clarisa Kindred A, FNP  midodrine (PROAMATINE) 10 MG tablet Take 1 tablet (10 mg total) by mouth 3 (three) times daily with meals. 07/12/21 08/11/21 Yes Lynn Ito, MD  omeprazole (PRILOSEC) 20 MG capsule  Take 20 mg by mouth 2 (two) times daily. 05/03/21  Yes [provider]  potassium chloride SA (KLOR-CON) 20 MEQ tablet Take 20 mEq by mouth daily.   Yes [provider]  torsemide (DEMADEX) 20 MG tablet Take 1 tablet (20 mg total) by mouth daily. 07/13/21 08/12/21 Yes Lynn Ito, MD  atorvastatin (LIPITOR) 20 MG tablet Take 1 tablet by mouth daily. Patient not taking: Reported on 08/05/2021    [provider]  ivabradine (CORLANOR) 5 MG TABS tablet Take 1 tablet (5 mg total) by mouth 2 (two) times daily with a meal. Patient not taking: Reported on 08/05/2021 07/12/21 08/11/21  Lynn Ito, MD  K-PHOS 500 MG tablet Take 500 mg by mouth 2 (two) times daily. Patient not taking: Reported on 08/05/2021 06/25/21   [provider]  VENTOLIN HFA 108 (90 Base) MCG/ACT inhaler Inhale 1-2 puffs into the lungs every 4 (four) hours as needed. Patient not taking: Reported on 08/05/2021 04/17/21   [provider]    Allergies Patient has no known allergies.  Family History  Problem Relation Age of Onset   Heart failure Mother    Alcohol abuse Father     Social History Social History   Tobacco Use   Smoking status: Former    Types: Cigarettes   Smokeless tobacco: Never  Vaping Use   Vaping Use: Never used  Substance Use Topics   Alcohol use: Not Currently   Drug use: Not Currently    Review of Systems  Constitutional: No fever/chills.  Positive for confusion. Eyes: No visual changes. ENT: No sore throat. Cardiovascular: Denies chest pain. Respiratory: Denies shortness of breath. Gastrointestinal: Positive for abdominal pain.  No nausea, no vomiting.  No diarrhea.  No constipation. Genitourinary: Positive for dysuria. Musculoskeletal: Negative for back pain.  Positive for arthralgias. Skin: Negative for rash. Neurological: Negative for headaches, focal weakness or numbness.  ____________________________________________   PHYSICAL EXAM:  VITAL  SIGNS: ED Triage Vitals  Enc Vitals Group     BP      Pulse      Resp      Temp      Temp src      SpO2      Weight      Height      Head Circumference      Peak Flow      Pain Score      Pain Loc      Pain Edu?      Excl. in GC?     Constitutional: Alert and oriented to person, place, time, but not situation. Eyes: Conjunctivae are normal.  Pupils equal, round, and reactive to light bilaterally. Head: Atraumatic. Nose: No congestion/rhinnorhea. Mouth/Throat: Mucous membranes are dry. Neck: Normal ROM Cardiovascular: Normal rate, regular rhythm. Grossly normal heart sounds.  2+ radial pulses bilaterally. Respiratory: Normal respiratory effort.  No retractions. Lungs CTAB. Gastrointestinal: Soft and nontender. No distention. Genitourinary: deferred Musculoskeletal: No lower extremity tenderness nor edema. Neurologic:  Normal speech and language. No  gross focal neurologic deficits are appreciated. Skin:  Skin is warm, dry and intact. No rash noted. Psychiatric: Mood and affect are normal. Speech and behavior are normal.  ____________________________________________   LABS (all labs ordered are listed, but only abnormal results are displayed)  Labs Reviewed  CBC WITH DIFFERENTIAL/PLATELET - Abnormal; Notable for the following components:      Result Value   WBC 11.5 (*)    All other components within normal limits  COMPREHENSIVE METABOLIC PANEL - Abnormal; Notable for the following components:   Sodium 131 (*)    Chloride 89 (*)    Glucose, Bld 560 (*)    AST 47 (*)    ALT 53 (*)    All other components within normal limits  URINALYSIS, COMPLETE (UACMP) WITH MICROSCOPIC - Abnormal; Notable for the following components:   Color, Urine STRAW (*)    APPearance CLEAR (*)    Glucose, UA >=500 (*)    Leukocytes,Ua LARGE (*)    All other components within normal limits  BLOOD GAS, VENOUS - Abnormal; Notable for the following components:   pH, Ven 7.54 (*)    pCO2, Ven  40 (*)    Bicarbonate 34.2 (*)    Acid-Base Excess 10.6 (*)    All other components within normal limits  TROPONIN I (HIGH SENSITIVITY) - Abnormal; Notable for the following components:   Troponin I (High Sensitivity) 20 (*)    All other components within normal limits  URINE CULTURE  RESP PANEL BY RT-PCR (FLU A&B, COVID) ARPGX2  MAGNESIUM  TROPONIN I (HIGH SENSITIVITY)   ____________________________________________  EKG  ED ECG REPORT I, Chesley Noon, the attending physician, personally viewed and interpreted this ECG.   Date: 08/05/2021  EKG Time: 20:59  Rate: 81  Rhythm: normal sinus rhythm  Axis: LAD  Intervals: Prolonged QT  ST&T Change: Nonspecific T wave changes   PROCEDURES  Procedure(s) performed (including Critical Care):  Procedures   ____________________________________________   INITIAL IMPRESSION / ASSESSMENT AND PLAN / ED COURSE      50 year old female with past medical history of hypertension, diabetes, CAD, CHF, ventricular tachycardia, PE on Eliquis, asthma, and bipolar disorder who presents to the ED due to concern for altered mental status, noted to have high blood glucose by EMS.  Patient is awake and alert but slightly disoriented, no focal neurologic deficits noted on exam.  She was recently started on blood thinners for new diagnosis of PE, also diagnosed with significant cardiomyopathy at that time.  We will check CT head, screen labs for DKA versus HHS.  Patient does appear dehydrated and we will start IV fluid hydration to address her hyperglycemia.  Chest x-ray and UA are pending to assess for infectious process.  CT head is negative for acute process, labs remarkable for hyperglycemia but no evidence of DKA.  Patient appears more alert and less confused on reassessment.  In further conversation with daughter, it appears patient had an episode 2 days ago where she passed out, then appeared very disoriented and like she was going to pass out  again today.  She is high risk for arrhythmia given her low EF and history of nonsustained V. tach and she would benefit from admission for observation as well as further management of her hyperglycemia.  She has received about 1 day of antibiotics for UTI, we will send urine for culture and give IV Rocephin at this time.  Plan to discuss with hospitalist for admission.  ____________________________________________   FINAL CLINICAL IMPRESSION(S) / ED DIAGNOSES  Final diagnoses:  Altered mental status, unspecified altered mental status type  Near syncope  Hyperglycemia     ED Discharge Orders     None        Note:  This document was prepared using Dragon voice recognition software and may include unintentional dictation errors.    Chesley Noon, MD 08/05/21 [phone]

## 2021-08-05 NOTE — ED Notes (Signed)
Critical blood glucose--560, MD notified and aware.

## 2021-08-05 NOTE — ED Triage Notes (Signed)
Patient arrived via EMS from home after her daughter called 911 following an episode of confusion. EMS states her blood sugar reading was >600. IV was started in route and patient received 515ml's of NS, however it infiltrated prior to arrival. Patient is AA&O on arrival. States that she fell the other day after "blacking out" and reports pain "all over". Per EMS, pt just finished treatment for a UTI.

## 2021-08-05 NOTE — ED Notes (Signed)
Unable to place IV, MD notified and aware. IV team consult placed.

## 2021-08-05 NOTE — ED Notes (Signed)
MD at the bedside  

## 2021-08-05 NOTE — ED Notes (Signed)
Patient in CT

## 2021-08-05 NOTE — ED Notes (Signed)
Port CXR performed 

## 2021-08-06 ENCOUNTER — Observation Stay: Payer: Medicare Other

## 2021-08-06 DIAGNOSIS — I5022 Chronic systolic (congestive) heart failure: Secondary | ICD-10-CM

## 2021-08-06 DIAGNOSIS — I5082 Biventricular heart failure: Secondary | ICD-10-CM | POA: Diagnosis present

## 2021-08-06 DIAGNOSIS — E1165 Type 2 diabetes mellitus with hyperglycemia: Secondary | ICD-10-CM

## 2021-08-06 DIAGNOSIS — F259 Schizoaffective disorder, unspecified: Secondary | ICD-10-CM

## 2021-08-06 DIAGNOSIS — R55 Syncope and collapse: Secondary | ICD-10-CM | POA: Diagnosis present

## 2021-08-06 DIAGNOSIS — I428 Other cardiomyopathies: Secondary | ICD-10-CM | POA: Diagnosis present

## 2021-08-06 DIAGNOSIS — Z8249 Family history of ischemic heart disease and other diseases of the circulatory system: Secondary | ICD-10-CM | POA: Diagnosis not present

## 2021-08-06 DIAGNOSIS — Z7984 Long term (current) use of oral hypoglycemic drugs: Secondary | ICD-10-CM | POA: Diagnosis not present

## 2021-08-06 DIAGNOSIS — F250 Schizoaffective disorder, bipolar type: Secondary | ICD-10-CM

## 2021-08-06 DIAGNOSIS — F319 Bipolar disorder, unspecified: Secondary | ICD-10-CM | POA: Diagnosis present

## 2021-08-06 DIAGNOSIS — Z86711 Personal history of pulmonary embolism: Secondary | ICD-10-CM

## 2021-08-06 DIAGNOSIS — Z20822 Contact with and (suspected) exposure to covid-19: Secondary | ICD-10-CM | POA: Diagnosis present

## 2021-08-06 DIAGNOSIS — Z79899 Other long term (current) drug therapy: Secondary | ICD-10-CM | POA: Diagnosis not present

## 2021-08-06 DIAGNOSIS — N390 Urinary tract infection, site not specified: Secondary | ICD-10-CM

## 2021-08-06 DIAGNOSIS — M25512 Pain in left shoulder: Secondary | ICD-10-CM | POA: Diagnosis present

## 2021-08-06 DIAGNOSIS — I472 Ventricular tachycardia: Secondary | ICD-10-CM | POA: Diagnosis present

## 2021-08-06 DIAGNOSIS — I5042 Chronic combined systolic (congestive) and diastolic (congestive) heart failure: Secondary | ICD-10-CM | POA: Diagnosis present

## 2021-08-06 DIAGNOSIS — J45909 Unspecified asthma, uncomplicated: Secondary | ICD-10-CM

## 2021-08-06 DIAGNOSIS — I2510 Atherosclerotic heart disease of native coronary artery without angina pectoris: Secondary | ICD-10-CM | POA: Diagnosis present

## 2021-08-06 DIAGNOSIS — I110 Hypertensive heart disease with heart failure: Secondary | ICD-10-CM | POA: Diagnosis present

## 2021-08-06 DIAGNOSIS — W19XXXA Unspecified fall, initial encounter: Secondary | ICD-10-CM | POA: Diagnosis present

## 2021-08-06 DIAGNOSIS — Z87891 Personal history of nicotine dependence: Secondary | ICD-10-CM | POA: Diagnosis not present

## 2021-08-06 DIAGNOSIS — M25561 Pain in right knee: Secondary | ICD-10-CM | POA: Diagnosis present

## 2021-08-06 DIAGNOSIS — Z7901 Long term (current) use of anticoagulants: Secondary | ICD-10-CM | POA: Diagnosis not present

## 2021-08-06 LAB — CBG MONITORING, ED
Glucose-Capillary: 105 mg/dL — ABNORMAL HIGH (ref 70–99)
Glucose-Capillary: 131 mg/dL — ABNORMAL HIGH (ref 70–99)
Glucose-Capillary: 194 mg/dL — ABNORMAL HIGH (ref 70–99)
Glucose-Capillary: 217 mg/dL — ABNORMAL HIGH (ref 70–99)
Glucose-Capillary: 236 mg/dL — ABNORMAL HIGH (ref 70–99)
Glucose-Capillary: 321 mg/dL — ABNORMAL HIGH (ref 70–99)
Glucose-Capillary: 447 mg/dL — ABNORMAL HIGH (ref 70–99)

## 2021-08-06 LAB — TROPONIN I (HIGH SENSITIVITY): Troponin I (High Sensitivity): 21 ng/L — ABNORMAL HIGH (ref <18)

## 2021-08-06 LAB — HEMOGLOBIN A1C
Hgb A1c MFr Bld: 9.9 % — ABNORMAL HIGH (ref 4.8–5.6)
Mean Plasma Glucose: 237.43 mg/dL

## 2021-08-06 LAB — LIPID PANEL
Cholesterol: 210 mg/dL — ABNORMAL HIGH (ref 0–200)
HDL: 39 mg/dL — ABNORMAL LOW (ref >40)
LDL Cholesterol: 137 mg/dL — ABNORMAL HIGH (ref 0–99)
Total CHOL/HDL Ratio: 5.4 RATIO
Triglycerides: 172 mg/dL — ABNORMAL HIGH (ref <150)
VLDL: 34 mg/dL (ref 0–40)

## 2021-08-06 LAB — RESP PANEL BY RT-PCR (FLU A&B, COVID) ARPGX2
Influenza A by PCR: NEGATIVE
Influenza B by PCR: NEGATIVE
SARS Coronavirus 2 by RT PCR: NEGATIVE

## 2021-08-06 LAB — TSH: TSH: 0.846 u[IU]/mL (ref 0.350–4.500)

## 2021-08-06 MED ORDER — IVABRADINE HCL 5 MG PO TABS
5.0000 mg | ORAL_TABLET | Freq: Two times a day (BID) | ORAL | Status: DC
  Administered 2021-08-06 – 2021-08-08 (×6): 5 mg via ORAL
  Filled 2021-08-06 (×9): qty 1

## 2021-08-06 MED ORDER — CEPHALEXIN 500 MG PO CAPS
500.0000 mg | ORAL_CAPSULE | Freq: Four times a day (QID) | ORAL | Status: DC
  Administered 2021-08-06 – 2021-08-08 (×11): 500 mg via ORAL
  Filled 2021-08-06 (×13): qty 1

## 2021-08-06 MED ORDER — POTASSIUM CHLORIDE CRYS ER 20 MEQ PO TBCR
20.0000 meq | EXTENDED_RELEASE_TABLET | Freq: Every day | ORAL | Status: DC
  Administered 2021-08-06 – 2021-08-08 (×3): 20 meq via ORAL
  Filled 2021-08-06 (×3): qty 1

## 2021-08-06 MED ORDER — METOPROLOL SUCCINATE ER 25 MG PO TB24
25.0000 mg | ORAL_TABLET | Freq: Every day | ORAL | Status: DC
  Administered 2021-08-06 – 2021-08-08 (×3): 25 mg via ORAL
  Filled 2021-08-06 (×3): qty 1

## 2021-08-06 MED ORDER — ATORVASTATIN CALCIUM 20 MG PO TABS
20.0000 mg | ORAL_TABLET | Freq: Every day | ORAL | Status: DC
  Administered 2021-08-06 – 2021-08-08 (×3): 20 mg via ORAL
  Filled 2021-08-06 (×3): qty 1

## 2021-08-06 MED ORDER — MIDODRINE HCL 5 MG PO TABS
10.0000 mg | ORAL_TABLET | Freq: Three times a day (TID) | ORAL | Status: DC
  Administered 2021-08-06 – 2021-08-08 (×9): 10 mg via ORAL
  Filled 2021-08-06 (×10): qty 2

## 2021-08-06 MED ORDER — HALOPERIDOL 5 MG PO TABS
10.0000 mg | ORAL_TABLET | Freq: Every day | ORAL | Status: DC
  Administered 2021-08-06 – 2021-08-07 (×3): 10 mg via ORAL
  Filled 2021-08-06 (×4): qty 2

## 2021-08-06 MED ORDER — SODIUM CHLORIDE 0.9% FLUSH
3.0000 mL | Freq: Two times a day (BID) | INTRAVENOUS | Status: DC
  Administered 2021-08-06 – 2021-08-08 (×6): 3 mL via INTRAVENOUS

## 2021-08-06 MED ORDER — ACETAMINOPHEN ER 650 MG PO TBCR: 650.0000 mg | EXTENDED_RELEASE_TABLET | Freq: Three times a day (TID) | ORAL | Status: DC | PRN

## 2021-08-06 MED ORDER — PANTOPRAZOLE SODIUM 40 MG PO TBEC
40.0000 mg | DELAYED_RELEASE_TABLET | Freq: Every day | ORAL | Status: DC
Start: 2021-08-06 — End: 2021-08-09
  Administered 2021-08-06 – 2021-08-08 (×3): 40 mg via ORAL
  Filled 2021-08-06 (×3): qty 1

## 2021-08-06 MED ORDER — AMIODARONE HCL 200 MG PO TABS
200.0000 mg | ORAL_TABLET | Freq: Every day | ORAL | Status: DC
  Administered 2021-08-06 – 2021-08-08 (×3): 200 mg via ORAL
  Filled 2021-08-06 (×3): qty 1

## 2021-08-06 MED ORDER — ACETAMINOPHEN 325 MG PO TABS
650.0000 mg | ORAL_TABLET | Freq: Three times a day (TID) | ORAL | Status: DC | PRN
  Administered 2021-08-06: 650 mg via ORAL
  Filled 2021-08-06: qty 2

## 2021-08-06 MED ORDER — DULOXETINE HCL 30 MG PO CPEP
60.0000 mg | ORAL_CAPSULE | Freq: Every day | ORAL | Status: DC
  Administered 2021-08-06 – 2021-08-08 (×3): 60 mg via ORAL
  Filled 2021-08-06: qty 2
  Filled 2021-08-06 (×2): qty 1

## 2021-08-06 MED ORDER — DIGOXIN 250 MCG PO TABS
0.2500 mg | ORAL_TABLET | Freq: Every day | ORAL | Status: DC
  Administered 2021-08-06 – 2021-08-08 (×3): 0.25 mg via ORAL
  Filled 2021-08-06 (×3): qty 1

## 2021-08-06 MED ORDER — APIXABAN 5 MG PO TABS
5.0000 mg | ORAL_TABLET | Freq: Two times a day (BID) | ORAL | Status: DC
  Administered 2021-08-06 – 2021-08-08 (×6): 5 mg via ORAL
  Filled 2021-08-06 (×6): qty 1

## 2021-08-06 NOTE — Progress Notes (Signed)
PROGRESS NOTE  Martha Ramos  DOB: January 31, 1971  PCP: Darnelle Spangle, MD IWP:[id number]  DOA: 08/05/2021  LOS: 0 days  Hospital Day: 2   Chief Complaint  Patient presents with   Altered Mental Status    Brief narrative: Martha Ramos is a 50 y.o. female with PMH significant for DM2, HTN, schizoaffective disorder, bipolar disorder.  One month ago in July 2022, patient was admitted with acute pulm embolism.  At the time she was in cardiogenic shock with severe systolic CHF (EF less than 20% in July 2022), cardiac cath showed normal coronaries, cardiomyopathy with dense thought to be stress-induced.  She was also having NSVTs at that time she was discharged on amiodarone, ivabradine, Eliquis and CHF regimen including Toprol, digoxin, losartan, midodrine, torsemide. Patient presented to the ED on 8/15 with after an episode of syncope and blackout. Patient passed out 2 days ago while she was standing in the kitchen cooking.  She regained consciousness soon.  On the day of admission, patient was getting into the car and she blacked out again.  She does not recall hitting hard against anything but later on felt soreness all over body especially in the left shoulder, right knee and her tailbone.  In the ED, patient was afebrile, heart rate in 80s, blood pressure 120/57 Labs with sodium 131, glucose elevated to 560, troponin slightly elevated at 20, creatinine normal, CBC unremarkable Urinalysis with clear straw-colored urine with large amount of leukocytes CT head unremarkable EKG showed normal sinus rhythm with QTC prolonged at 540 ms Admitted to hospital service.  Subjective: Patient was seen and examined this afternoon.  Middle-aged African-American female.  Lying on bed.  Not in distress.  No new symptoms..   Chart reviewed Heart rate stable, blood pressure in low normal range  Assessment/Plan: Syncope -Presented with syncope while standing and bending down in the setting of  new severe systolic CHF, NSVT, prolonged QTC.   -Continue to monitor hemodynamics, obtain orthostatic vital signs.  Continue to monitor on telemetry.  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 Cardiac cath showed normal coronaries.  Suspected to have stress-induced cardiomyopathy. -Discharge meds at that time included amiodarone, ivabradine, Toprol, digoxin, torsemide, midodrine, losartan, Eliquis -Continue to monitor heart rate, blood pressure  -Cardiology consulted.  Prolonged QTC -EKG with QTC prolonged to 540 ms. -Patient on QT prolonging medications including amiodarone, Cymbalta, Haldol -Potassium and magnesium level remain normal. Recent Labs  Lab 08/05/21 2101 08/05/21 2235  K 4.2  --   MG  --  2.1    Recently diagnosed pulmonary embolism -continue Eliquis  ?Hyperlipidemia -Update lipid panel.  Currently on statin.  Type 2 diabetes mellitus Hyperglycemia -A1c 6.1 on 07/06/2021.  But within a month, A1c seems to have worsened to 9.9. -Supposed to be on glipizide 5 mg twice daily at home.  Questionable compliance -Presented with blood sugar level elevated to 447, down to 217 this morning.   -Currently on sliding scale insulin with Accu-Cheks. Recent Labs  Lab 08/06/21 0018 08/06/21 0356 08/06/21 0804 08/06/21 1203 08/06/21 1629  GLUCAP 447* 105* 217* 321* 131*   Elevated LFTs -Mild elevation.  Patient is on amiodarone, statin.  Continue to monitor LFTs. Recent Labs  Lab 08/05/21 2101  AST 47*  ALT 53*  ALKPHOS 92  BILITOT 0.7  PROT 7.5  ALBUMIN 3.6   Pain from fall -No fracture noted in x-rays of right shoulder, left knee and pelvis.  Recently dx UTI -UA here with large leukocyte, negative -continue with Keflex    Schizoaffective/bipolar disorder Continue Cymbalta and Haldol   Hx of asthma - Continue as needed albuterol  Mobility: Encourage  ambulation Code Status:   Code Status: Full Code  Nutritional status: Body mass index is 32.17 kg/m.     Diet:  Diet Order             Diet Heart Room service appropriate? Yes; Fluid consistency: Thin  Diet effective now                  DVT prophylaxis:   apixaban (ELIQUIS) tablet 5 mg   Antimicrobials: None Fluid: None Consultants: Cardiology called. Family Communication: None at bedside  Status is: Observation  Remains inpatient appropriate because: Needs further testing and consultation  Dispo: The patient is from: Home              Anticipated d/c is to: Home in 1 to 2 days              Patient currently is not medically stable to d/c.   Difficult to place patient No     Infusions:    Scheduled Meds:  amiodarone  200 mg Oral Daily   apixaban  5 mg Oral BID   atorvastatin  20 mg Oral Daily   cephALEXin  500 mg Oral QID   digoxin  0.25 mg Oral Daily   DULoxetine  60 mg Oral Daily   haloperidol  10 mg Oral QHS   insulin aspart  0-15 Units Subcutaneous Q4H   ivabradine  5 mg Oral BID WC   metoprolol succinate  25 mg Oral Daily   midodrine  10 mg Oral TID WC   pantoprazole  40 mg Oral Daily   potassium chloride SA  20 mEq Oral Daily   sodium chloride flush  3 mL Intravenous Q12H    Antimicrobials: Anti-infectives (From admission, onward)    Start     Dose/Rate Route Frequency Ordered Stop   08/06/21 1000  cephALEXin (KEFLEX) capsule 500 mg        500 mg Oral 4 times daily 08/06/21 0138     08/05/21 2345  cefTRIAXone (ROCEPHIN) 1 g in sodium chloride 0.9 % 100 mL IVPB        1 g 200 mL/hr over 30 Minutes Intravenous  Once 08/05/21 2340 08/06/21 0057       PRN meds: acetaminophen   Objective: Vitals:   08/06/21 1430 08/06/21 1530  BP: 127/70 111/62  Pulse: 66 67  Resp: (!) 24 (!) 23  Temp:    SpO2: 99% 100%    Intake/Output Summary (Last 24 hours) at 08/06/2021 1644 Last data filed at 08/06/2021 0057 Gross per 24 hour  Intake 1152.3  ml  Output --  Net 1152.3 ml   Filed Weights   08/05/21 2043  Weight: 85 kg   Weight change:  Body mass index is 32.17 kg/m.   Physical Exam: General exam: Pleasant, middle-aged African-American female.  Not in distress Skin: No rashes, lesions or ulcers. HEENT: Atraumatic, normocephalic, no obvious bleeding Lungs: Clear to auscultation bilaterally CVS: Regular rate and rhythm, no murmur GI/Abd soft, nontender, nondistended, bowel sound present CNS: Alert, awake, oriented x3 Psychiatry: Mood appropriate Extremities: Trace bilateral pedal edema  Data Review: I have personally reviewed the laboratory data and studies available.  Recent Labs  Lab 08/05/21 2101  WBC 11.5*  NEUTROABS 7.6  HGB 13.8  HCT 40.3  MCV 86.9  PLT 287   Recent Labs  Lab 08/05/21 2101 08/05/21 2235  NA 131*  --   K 4.2  --   CL 89*  --   CO2 28  --   GLUCOSE 560*  --   BUN 15  --   CREATININE 0.99  --   CALCIUM 9.4  --   MG  --  2.1    F/u labs ordered Unresulted Labs (From admission, onward)     Start     Ordered   08/07/21 0500  CBC with Differential/Platelet  Daily,   STAT      08/06/21 1644   08/07/21 0500  Basic metabolic panel  Daily,   STAT      08/06/21 1644   08/05/21 2303  Urine Culture  Add-on,   AD       Question:  Indication  Answer:  Altered mental status (if no other cause identified)   08/05/21 2302            Signed, Lorin Glass, MD [facility] 08/06/2021

## 2021-08-06 NOTE — ED Notes (Signed)
Pt assisted via steadying assist to the bathroom and back.

## 2021-08-06 NOTE — ED Notes (Signed)
Hospitalist at the bedside 

## 2021-08-06 NOTE — ED Notes (Signed)
Port x-rays performed 

## 2021-08-06 NOTE — ED Notes (Signed)
CBG 321 

## 2021-08-06 NOTE — ED Notes (Signed)
Pt reports legs achy bilateral, repositioned.

## 2021-08-06 NOTE — ED Notes (Signed)
Pt eating dinner tray °

## 2021-08-06 NOTE — ED Notes (Signed)
Assisted pt to use the restroom

## 2021-08-06 NOTE — ED Notes (Signed)
Lying--102/60, 82  Sitting--110/70, 85  Standing--100/62, 88

## 2021-08-06 NOTE — ED Notes (Signed)
Full linen change done on this pt's bed. This RN offered to change pt into gown, they wanted to keep their own clothes on. Pt assisted back into bed, connected to all monitoring equipment, given fresh blanket, call bell and side table with belongings within reach.

## 2021-08-06 NOTE — ED Notes (Signed)
Pt requested all meds crushed with water.

## 2021-08-06 NOTE — H&P (Signed)
History and Physical    Martha Ramos PYP:[id number] DOB: 1971-08-01 DOA: 08/05/2021  PCP: Darnelle Spangle, MD  Patient coming from: Home  I have personally briefly reviewed patient's old medical records in [facility]  Chief Complaint: syncope with loss of consciousness  HPI: Martha Ramos is a 50 y.o. female with medical history significant for asthma, type 2 diabetes, hypertension, CAD, pulmonary embolism on Eliquis, chronic systolic CHF, nonsustained ventricular tachycardia on amiodarone, schizoaffective disorder, bipolar disorder who presents with concerns of syncope and blackout.  Patient reports that about 2 days ago she was standing in her kitchen cooking and lost consciousness.  Son was able to pick her up off of the ground.  Then today she was getting into the car passenger seat she again blacked out.  She is not sure if she hit her head but feels soreness all over especially around her left shoulder, right knee and her tailbone.  She states she has been feeling dizziness since April and had an episode of syncope with loss of consciousness then.  States she was hospitalized and was told she had elevated blood glucose at that time. She denies any chest pain or palpitation.  No nausea, vomiting or diarrhea. She reports being diagnosed sometime last week with UTI but just recently picked up Keflex several days ago since she cannot afford it.  She was recently admitted from 7/14-7/23 when she presented for chest pain and was found to have right lower lobe pulmonary embolism.  Subsequently also noted to be in acute on chronic systolic congestive heart failure with cardiogenic shock with echo showing EF of less than 20%. Also had nonsustained ventricular tachycardia started on amiodarone.   ED Course: She was afebrile and had occasional soft blood pressure of 90 over 60s.  Sodium of 131, potassium of 4.2, BG of 560 with no anion gap. AST of 47, ALT of 53  UA showed  large leukocyte, negative nitrite  CT head negative  Review of Systems: Constitutional: No Weight Change, No Fever ENT/Mouth: No sore throat, No Rhinorrhea Eyes: No Eye Pain, No Vision Changes Cardiovascular: No Chest Pain, no SOB, No Palpitations Respiratory: No Cough  Gastrointestinal: No Nausea, No Vomiting, No Diarrhea, No Constipation, No Pain Genitourinary: no Urinary Incontinence, No Urgency, No Flank Pain Musculoskeletal: + Arthralgias, No Myalgias Skin: No Skin Lesions, No Pruritus, Neuro: no Weakness, No Numbness,  + Loss of Consciousness, + Syncope Psych: No Anxiety/Panic, No Depression, + decrease appetite Heme/Lymph: No Bruising, No Bleeding   Past Medical History:  Diagnosis Date   Asthma    CAD (coronary artery disease)    CHF (congestive heart failure) (HCC)    Diabetes (HCC)    Hypertension     Past Surgical History:  Procedure Laterality Date   LEFT HEART CATH AND CORONARY ANGIOGRAPHY N/A 07/09/2021   Procedure: LEFT HEART CATH AND CORONARY ANGIOGRAPHY;  Surgeon: Tonny Bollman, MD;  Location: [facility] INVASIVE CV LAB;  Service: Cardiovascular;  Laterality: N/A;     reports that she has quit smoking. Her smoking use included cigarettes. She has never used smokeless tobacco. She reports that she does not currently use alcohol. She reports that she does not currently use drugs. Social History  No Known Allergies  Family History  Problem Relation Age of Onset   Heart failure Mother    Alcohol abuse Father      Prior to Admission medications   Medication Sig Start Date End Date Taking? Authorizing Provider  acetaminophen (TYLENOL) 650 MG  CR tablet Take 650 mg by mouth every 8 (eight) hours as needed for pain.   Yes [provider]  amiodarone (PACERONE) 200 MG tablet Take 1 tablet (200 mg total) by mouth daily. 07/13/21 08/12/21 Yes Lynn Ito, MD  apixaban (ELIQUIS) 5 MG TABS tablet Take 1 tablet (5 mg total) by mouth 2 (two) times daily for 23  days. Specifically: Take 10mg  twice daily (7/22 PM-7/27 PM); then start 5mg  twice daily dosing (7/28 AM). 07/18/21 08/10/21 Yes 07/20/21, MD  cephALEXin (KEFLEX) 500 MG capsule Take 500 mg by mouth 4 (four) times daily.   Yes [provider]  digoxin (LANOXIN) 0.25 MG tablet Take 1 tablet (0.25 mg total) by mouth daily. 07/13/21 08/12/21 Yes 07/15/21, MD  DULoxetine (CYMBALTA) 60 MG capsule Take 60 mg by mouth daily. 05/21/21  Yes [provider]  glipiZIDE (GLUCOTROL) 5 MG tablet Take 5 mg by mouth 2 (two) times daily. 04/17/21  Yes [provider]  haloperidol (HALDOL) 10 MG tablet Take 10 mg by mouth at bedtime. 05/15/21  Yes [provider]  losartan (COZAAR) 25 MG tablet Take 1 tablet (25 mg total) by mouth daily. 07/13/21 08/12/21 Yes 07/15/21, MD  metoprolol succinate (TOPROL-XL) 25 MG 24 hr tablet Take 1 tablet (25 mg total) by mouth daily. 07/30/21  Yes Lynn Ito A, FNP  midodrine (PROAMATINE) 10 MG tablet Take 1 tablet (10 mg total) by mouth 3 (three) times daily with meals. 07/12/21 08/11/21 Yes 07/14/21, MD  omeprazole (PRILOSEC) 20 MG capsule Take 20 mg by mouth 2 (two) times daily. 05/03/21  Yes [provider]  potassium chloride SA (KLOR-CON) 20 MEQ tablet Take 20 mEq by mouth daily.   Yes [provider]  torsemide (DEMADEX) 20 MG tablet Take 1 tablet (20 mg total) by mouth daily. 07/13/21 08/12/21 Yes 07/15/21, MD  atorvastatin (LIPITOR) 20 MG tablet Take 1 tablet by mouth daily. Patient not taking: Reported on 08/05/2021    [provider]  ivabradine (CORLANOR) 5 MG TABS tablet Take 1 tablet (5 mg total) by mouth 2 (two) times daily with a meal. Patient not taking: Reported on 08/05/2021 07/12/21 08/11/21  07/14/21, MD  K-PHOS 500 MG tablet Take 500 mg by mouth 2 (two) times daily. Patient not taking: Reported on 08/05/2021 06/25/21   [provider]  VENTOLIN HFA 108 (90 Base) MCG/ACT inhaler Inhale  1-2 puffs into the lungs every 4 (four) hours as needed. Patient not taking: Reported on 08/05/2021 04/17/21   [provider]    Physical Exam: Vitals:   08/05/21 2230 08/05/21 2315 08/06/21 0000 08/06/21 0030  BP: 111/74 118/69 125/72 111/69  Pulse: 80 77 84 88  Resp: (!) 21 15 17 16   Temp:      TempSrc:      SpO2: 99% 100% 100% 100%  Weight:      Height:        Constitutional: NAD, calm, comfortable, middle-aged female laying flat in bed asleep Vitals:   08/05/21 2230 08/05/21 2315 08/06/21 0000 08/06/21 0030  BP: 111/74 118/69 125/72 111/69  Pulse: 80 77 84 88  Resp: (!) 21 15 17 16   Temp:      TempSrc:      SpO2: 99% 100% 100% 100%  Weight:      Height:       Eyes: PERRL, lids and conjunctivae normal ENMT: Mucous membranes are moist.  Neck: normal, supple Respiratory: clear to auscultation bilaterally, no  wheezing, no crackles. Normal respiratory effort. No accessory muscle use.  Cardiovascular: Regular rate and rhythm, no murmurs / rubs / gallops.  Mild nonpitting edema of the left distal lower extremity.   Abdomen: no tenderness, no masses palpated.  Bowel sounds positive.  Musculoskeletal: no clubbing / cyanosis. No joint deformity upper and lower extremities. Good ROM, no contractures. Normal muscle tone.  Had pain with palpation of the posterior left shoulder, lower coccyx area. Skin: no rashes, lesions, ulcers. No induration Neurologic: CN 2-12 grossly intact. Sensation intact, Strength 5/5 in all 4.  Psychiatric: Alert and oriented x 3.  Flat affect.   Labs on Admission: I have personally reviewed following labs and imaging studies  CBC: Recent Labs  Lab 08/05/21 2101  WBC 11.5*  NEUTROABS 7.6  HGB 13.8  HCT 40.3  MCV 86.9  PLT 287   Basic Metabolic Panel: Recent Labs  Lab 08/05/21 2101 08/05/21 2235  NA 131*  --   K 4.2  --   CL 89*  --   CO2 28  --   GLUCOSE 560*  --   BUN 15  --   CREATININE 0.99  --   CALCIUM 9.4  --   MG  --   2.1   GFR: Estimated Creatinine Clearance: 71.7 mL/min (by C-G formula based on SCr of 0.99 mg/dL). Liver Function Tests: Recent Labs  Lab 08/05/21 2101  AST 47*  ALT 53*  ALKPHOS 92  BILITOT 0.7  PROT 7.5  ALBUMIN 3.6   No results for input(s): LIPASE, AMYLASE in the last 168 hours. No results for input(s): AMMONIA in the last 168 hours. Coagulation Profile: No results for input(s): INR, PROTIME in the last 168 hours. Cardiac Enzymes: No results for input(s): CKTOTAL, CKMB, CKMBINDEX, TROPONINI in the last 168 hours. BNP (last 3 results) No results for input(s): PROBNP in the last 8760 hours. HbA1C: No results for input(s): HGBA1C in the last 72 hours. CBG: Recent Labs  Lab 08/06/21 0018  GLUCAP 447*   Lipid Profile: No results for input(s): CHOL, HDL, LDLCALC, TRIG, CHOLHDL, LDLDIRECT in the last 72 hours. Thyroid Function Tests: No results for input(s): TSH, T4TOTAL, FREET4, T3FREE, THYROIDAB in the last 72 hours. Anemia Panel: No results for input(s): VITAMINB12, FOLATE, FERRITIN, TIBC, IRON, RETICCTPCT in the last 72 hours. Urine analysis:    Component Value Date/Time   COLORURINE STRAW (A) 08/05/2021 2129   APPEARANCEUR CLEAR (A) 08/05/2021 2129   LABSPEC 1.014 08/05/2021 2129   PHURINE 8.0 08/05/2021 2129   GLUCOSEU >=500 (A) 08/05/2021 2129   HGBUR NEGATIVE 08/05/2021 2129   BILIRUBINUR NEGATIVE 08/05/2021 2129   KETONESUR NEGATIVE 08/05/2021 2129   PROTEINUR NEGATIVE 08/05/2021 2129   NITRITE NEGATIVE 08/05/2021 2129   LEUKOCYTESUR LARGE (A) 08/05/2021 2129    Radiological Exams on Admission: CT Head Wo Contrast  Result Date: 08/05/2021 CLINICAL DATA:  Altered mental status. EXAM: CT HEAD WITHOUT CONTRAST TECHNIQUE: Contiguous axial images were obtained from the base of the skull through the vertex without intravenous contrast. COMPARISON:  None. FINDINGS: Brain: No evidence of acute infarction, hemorrhage, hydrocephalus, extra-axial collection or mass  lesion/mass effect. Vascular: No hyperdense vessel or unexpected calcification. Skull: Normal. Negative for fracture or focal lesion. Sinuses/Orbits: No acute finding. Other: None. IMPRESSION: No acute intracranial abnormality. Electronically Signed   By: Aram Candela M.D.   On: 08/05/2021 21:21   DG Chest Portable 1 View  Result Date: 08/05/2021 CLINICAL DATA:  Weakness EXAM: PORTABLE CHEST 1 VIEW COMPARISON:  07/06/2021 FINDINGS: The heart size and mediastinal contours are within normal limits. Both lungs are clear. No pleural effusion. The visualized skeletal structures are unremarkable. IMPRESSION: No acute process in chest. Electronically Signed   By: Guadlupe Spanish M.D.   On: 08/05/2021 20:50      Assessment/Plan  Syncope with loss of consciousness -pt w/ recent hx of NSVT started on amiodarone. Will keep on continuous telemetry  -last echo in July with EF of 20%. Will repeat.  -orthostatic vital signs -check AM TSH-recently started on amiodarone  Borderline low BP -soft BP systolic 90s in ED -hold antihpertensives  -continue midodrine  Pain from fall -check right shoulder, left knee and pelvic X-ray   Elevated LFTs -mildly elevated. unclear but just recently placed on amiodarone. Follow with repeat lab tomorrow  Recently dx UTI UA here with large leukocyte, negative continue with Keflex   Type 2 DM with hyperglycemia -BG of 560. Has received 15 units aspart in ED. Will continue with  q4hr correctional sliding scale   Recent PE -continue Eliquis  Chronic systolic heart failure -continue Ivabradine, metoprolol, digoxin but hold Losartan and Torsemide for now with soft BP  Hx of NSVT -continue amiodarone  Schizoaffective/bipolar disorder - Continue home Haldol  Hx of asthma - Continue as needed albuterol  DVT prophylaxis:.Lovenox Code Status: Full Family Communication: Plan discussed with patient at bedside  disposition Plan: Home with  observation Consults called:  Admission status: Observation  Level of care: Progressive Cardiac  Status is: Observation  The patient remains OBS appropriate and will d/c before 2 midnights.  Dispo: The patient is from: Home              Anticipated d/c is to: Home              Patient currently is not medically stable to d/c.   Difficult to place patient No         Anselm Jungling DO [facility]   If 7PM-7AM, please contact night-coverage [email or web address]   08/06/2021, 1:14 AM

## 2021-08-07 ENCOUNTER — Inpatient Hospital Stay (INDEPENDENT_AMBULATORY_CARE_PROVIDER_SITE_OTHER): Payer: Medicare Other

## 2021-08-07 ENCOUNTER — Other Ambulatory Visit: Payer: Self-pay | Admitting: Physician Assistant

## 2021-08-07 DIAGNOSIS — R55 Syncope and collapse: Secondary | ICD-10-CM

## 2021-08-07 LAB — CBG MONITORING, ED
Glucose-Capillary: 176 mg/dL — ABNORMAL HIGH (ref 70–99)
Glucose-Capillary: 213 mg/dL — ABNORMAL HIGH (ref 70–99)
Glucose-Capillary: 317 mg/dL — ABNORMAL HIGH (ref 70–99)

## 2021-08-07 LAB — CBC WITH DIFFERENTIAL/PLATELET
Abs Immature Granulocytes: 0.1 10*3/uL — ABNORMAL HIGH (ref 0.00–0.07)
Basophils Absolute: 0.1 10*3/uL (ref 0.0–0.1)
Basophils Relative: 1 %
Eosinophils Absolute: 0.1 10*3/uL (ref 0.0–0.5)
Eosinophils Relative: 1 %
HCT: 38.5 % (ref 36.0–46.0)
Hemoglobin: 13.2 g/dL (ref 12.0–15.0)
Immature Granulocytes: 1 %
Lymphocytes Relative: 24 %
Lymphs Abs: 3.2 10*3/uL (ref 0.7–4.0)
MCH: 29.5 pg (ref 26.0–34.0)
MCHC: 34.3 g/dL (ref 30.0–36.0)
MCV: 85.9 fL (ref 80.0–100.0)
Monocytes Absolute: 1.1 10*3/uL — ABNORMAL HIGH (ref 0.1–1.0)
Monocytes Relative: 8 %
Neutro Abs: 8.9 10*3/uL — ABNORMAL HIGH (ref 1.7–7.7)
Neutrophils Relative %: 65 %
Platelets: 250 10*3/uL (ref 150–400)
RBC: 4.48 MIL/uL (ref 3.87–5.11)
RDW: 13.1 % (ref 11.5–15.5)
WBC: 13.5 10*3/uL — ABNORMAL HIGH (ref 4.0–10.5)
nRBC: 0 % (ref 0.0–0.2)

## 2021-08-07 LAB — URINE CULTURE: Culture: <10000 — AB

## 2021-08-07 LAB — BASIC METABOLIC PANEL
Anion gap: 11 (ref 5–15)
BUN: 11 mg/dL (ref 6–20)
CO2: 28 mmol/L (ref 22–32)
Calcium: 9.5 mg/dL (ref 8.9–10.3)
Chloride: 97 mmol/L — ABNORMAL LOW (ref 98–111)
Creatinine, Ser: 0.85 mg/dL (ref 0.44–1.00)
GFR, Estimated: >60 mL/min (ref >60)
Glucose, Bld: 205 mg/dL — ABNORMAL HIGH (ref 70–99)
Potassium: 4.6 mmol/L (ref 3.5–5.1)
Sodium: 136 mmol/L (ref 135–145)

## 2021-08-07 LAB — GLUCOSE, CAPILLARY
Glucose-Capillary: 214 mg/dL — ABNORMAL HIGH (ref 70–99)
Glucose-Capillary: 302 mg/dL — ABNORMAL HIGH (ref 70–99)

## 2021-08-07 LAB — PHOSPHORUS: Phosphorus: 4.6 mg/dL (ref 2.5–4.6)

## 2021-08-07 MED ORDER — TORSEMIDE 20 MG PO TABS
20.0000 mg | ORAL_TABLET | Freq: Every day | ORAL | Status: DC
  Administered 2021-08-07 – 2021-08-08 (×2): 20 mg via ORAL
  Filled 2021-08-07 (×2): qty 1

## 2021-08-07 NOTE — ED Notes (Signed)
Transport paged

## 2021-08-07 NOTE — ED Notes (Signed)
Informed Rn bed assigned  

## 2021-08-07 NOTE — ED Notes (Signed)
This ED tech called dietary to replace an order for the pt. Pt stated, "I don't eat bread."

## 2021-08-07 NOTE — Consult Note (Signed)
Cardiology Consultation:   Patient ID: Martha Ramos MRN: [id number]; DOB: [birth year]  Admit date: 08/05/2021 Date of Consult: 08/07/2021  PCP:  Darnelle Spangle, MD   [facility]  Cardiologist:  Julien Nordmann, MD  Advanced Practice Provider:  No care team member to display Electrophysiologist:  None [id number]}    Patient Profile:   Martha Ramos is a 50 y.o. female with a hx of HFrEF, right lower lobe pulmonary embolism, WCT, NSVT, diabetes, hypertension, coronary artery disease, bipolar disorder, asthma, and who is being seen today for the evaluation of syncope at the request of Dr. Lucianne Muss.  History of Present Illness:   Martha Ramos is a 50 year old female with PMH as above.  She was last seen in the hospital 06/2021 after admission 7/14 with dyspnea and shock in the setting of right lower lobe pulmonary embolism.   She was started on Eliquis 5 mg twice daily.  Echo showed biventricular failure with LVEF less than 20%.  She had severely reduced RV function.  She was started on torsemide 20 mg daily.  Hospital course was complicated by NSVT / WCT with patient started on amiodarone and digoxin.  Beta-blocker discontinued in the setting of hypertension.  She was then started on ivabradine.  Left heart catheterization was without evidence of coronary artery disease and normal LVEDP.  Before discharge, losartan 25 mg daily was added.  On 08/05/2021, she presented to [facility] after a reported but unwitnessed episodes of syncope versus near syncope with events unclear due to pt AMS. Per documentation, she reported to the syncope events occurred 2 days before admission while standing in the kitchen and cooking with an additional syncopal episode while getting into the car.  At presentation, she was noted to have altered mental status, and it has been unclear if her syncopal episodes occurred.    ED labs consistent with severe hyperglycemia and UTI.  She reported taking 1  day of abx for a UTI prior to admission. WBC 11.5, sodium 131, glucose 560, AST 47, ALT 53.  UA showed leukocytes.  High-sensitivity troponin 20.  EKG with prolonged QT and no specific ST or T changes.  Past Medical History:  Diagnosis Date   Asthma    CAD (coronary artery disease)    CHF (congestive heart failure) (HCC)    Diabetes (HCC)    Hypertension     Past Surgical History:  Procedure Laterality Date   LEFT HEART CATH AND CORONARY ANGIOGRAPHY N/A 07/09/2021   Procedure: LEFT HEART CATH AND CORONARY ANGIOGRAPHY;  Surgeon: Tonny Bollman, MD;  Location: [facility] INVASIVE CV LAB;  Service: Cardiovascular;  Laterality: N/A;     Home Medications:  Prior to Admission medications   Medication Sig Start Date End Date Taking? Authorizing Provider  acetaminophen (TYLENOL) 650 MG CR tablet Take 650 mg by mouth every 8 (eight) hours as needed for pain.   Yes [provider]  amiodarone (PACERONE) 200 MG tablet Take 1 tablet (200 mg total) by mouth daily. 07/13/21 08/12/21 Yes Lynn Ito, MD  apixaban (ELIQUIS) 5 MG TABS tablet Take 1 tablet (5 mg total) by mouth 2 (two) times daily for 23 days. Specifically: Take 10mg  twice daily (7/22 PM-7/27 PM); then start 5mg  twice daily dosing (7/28 AM). 07/18/21 08/10/21 Yes 07/20/21, MD  cephALEXin (KEFLEX) 500 MG capsule Take 500 mg by mouth 4 (four) times daily.   Yes [provider]  digoxin (LANOXIN) 0.25 MG tablet Take 1 tablet (0.25 mg total) by mouth daily.  07/13/21 08/12/21 Yes Lynn ItoAmery, Sahar, MD  DULoxetine (CYMBALTA) 60 MG capsule Take 60 mg by mouth daily. 05/21/21  Yes [provider]  glipiZIDE (GLUCOTROL) 5 MG tablet Take 5 mg by mouth 2 (two) times daily. 04/17/21  Yes [provider]  haloperidol (HALDOL) 10 MG tablet Take 10 mg by mouth at bedtime. 05/15/21  Yes [provider]  losartan (COZAAR) 25 MG tablet Take 1 tablet (25 mg total) by mouth daily. 07/13/21 08/12/21 Yes Lynn ItoAmery, Sahar, MD  metoprolol  succinate (TOPROL-XL) 25 MG 24 hr tablet Take 1 tablet (25 mg total) by mouth daily. 07/30/21  Yes Clarisa KindredHackney, Tina A, FNP  midodrine (PROAMATINE) 10 MG tablet Take 1 tablet (10 mg total) by mouth 3 (three) times daily with meals. 07/12/21 08/11/21 Yes Lynn ItoAmery, Sahar, MD  omeprazole (PRILOSEC) 20 MG capsule Take 20 mg by mouth 2 (two) times daily. 05/03/21  Yes [provider]  potassium chloride SA (KLOR-CON) 20 MEQ tablet Take 20 mEq by mouth daily.   Yes [provider]  torsemide (DEMADEX) 20 MG tablet Take 1 tablet (20 mg total) by mouth daily. 07/13/21 08/12/21 Yes Lynn ItoAmery, Sahar, MD  atorvastatin (LIPITOR) 20 MG tablet Take 1 tablet by mouth daily. Patient not taking: Reported on 08/05/2021    [provider]  ivabradine (CORLANOR) 5 MG TABS tablet Take 1 tablet (5 mg total) by mouth 2 (two) times daily with a meal. Patient not taking: Reported on 08/05/2021 07/12/21 08/11/21  Lynn ItoAmery, Sahar, MD  K-PHOS 500 MG tablet Take 500 mg by mouth 2 (two) times daily. Patient not taking: Reported on 08/05/2021 06/25/21   [provider]  VENTOLIN HFA 108 (90 Base) MCG/ACT inhaler Inhale 1-2 puffs into the lungs every 4 (four) hours as needed. Patient not taking: Reported on 08/05/2021 04/17/21   [provider]    Inpatient Medications: Scheduled Meds:  amiodarone  200 mg Oral Daily   apixaban  5 mg Oral BID   atorvastatin  20 mg Oral Daily   cephALEXin  500 mg Oral QID   digoxin  0.25 mg Oral Daily   DULoxetine  60 mg Oral Daily   haloperidol  10 mg Oral QHS   insulin aspart  0-15 Units Subcutaneous Q4H   ivabradine  5 mg Oral BID WC   metoprolol succinate  25 mg Oral Daily   midodrine  10 mg Oral TID WC   pantoprazole  40 mg Oral Daily   potassium chloride SA  20 mEq Oral Daily   sodium chloride flush  3 mL Intravenous Q12H   Continuous Infusions:  PRN Meds: acetaminophen  Allergies:   No Known Allergies  Social History:   Social History   Socioeconomic  History   Marital status: Single    Spouse name: Not on file   Number of children: Not on file   Years of education: Not on file   Highest education level: Not on file  Occupational History   Not on file  Tobacco Use   Smoking status: Former    Types: Cigarettes   Smokeless tobacco: Never  Vaping Use   Vaping Use: Never used  Substance and Sexual Activity   Alcohol use: Not Currently   Drug use: Not Currently   Sexual activity: Not Currently  Other Topics Concern   Not on file  Social History Narrative   Not on file   Social Determinants of Health   Financial Resource Strain: Not on file  Food Insecurity: Not on  file  Transportation Needs: Not on file  Physical Activity: Not on file  Stress: Not on file  Social Connections: Not on file  Intimate Partner Violence: Not on file    Family History:    Family History  Problem Relation Age of Onset   Heart failure Mother    Alcohol abuse Father      ROS:  Please see the history of present illness.  As above in HPI All other ROS reviewed and negative.     Physical Exam/Data:   Vitals:   08/07/21 0000 08/07/21 0400 08/07/21 0700 08/07/21 0803  BP: 114/66 114/73 117/72   Pulse: 67 76 69   Resp: 19 17 (!) 26   Temp:  98.2 F (36.8 C)  98 F (36.7 C)  TempSrc:  Oral  Oral  SpO2: 98% 99% 99%   Weight:      Height:       No intake or output data in the 24 hours ending 08/07/21 0837 Last 3 Weights 08/05/2021 07/30/2021 07/12/2021  Weight (lbs) 187 lb 6.3 oz 187 lb 8 oz 213 lb 3 oz  Weight (kg) 85 kg 85.049 kg 96.7 kg     Body mass index is 32.17 kg/m.  General: Obese, NAD HEENT: normal Lymph: no adenopathy Neck: JVD difficult to assess due to body habitus Endocrine:  No thryomegaly Vascular: No carotid bruits; FA pulses 2+ bilaterally without bruits  Cardiac:  normal S1, S2; RRR; 2/6 systolic murmur LLSB Lungs:  clear to auscultation bilaterally, no wheezing, rhonchi or rales  Abd: soft, nontender, no  hepatomegaly  Ext: no edema Musculoskeletal:  No deformities, BUE and BLE strength normal and equal Skin: warm and dry  Neuro:  CNs 2-12 intact, no focal abnormalities noted Psych:  Normal affect   EKG:  The EKG was personally reviewed and demonstrates:  NSR, 81 bpm, borderline left axis deviation, T wave inversion as seen on prior lateral leads I, aVL, V5, V6, poor R wave progression throughout the precordial leads, Q waves noted in inferior leads, prolonged QTC at 540 ms  Relevant CV Studies: LHC 07/09/2021 LV end diastolic pressure is normal. Angiographically normal coronary arteries Normal LVEDP Systemic hypotension Pt given 500 cc 0.9% NS in the cath lab for treatment of hypotension. Resume heparin for treatment of PE 2 hours after TR band off. OK to transition to DOAC tomorrow if no bleeding complications arise.   Echo 07/05/2021  1. Severely dilated systolic cardiomyopathy.   2. Left ventricular ejection fraction, by estimation, is <20%. The left  ventricle has severely decreased function. The left ventricle has no  regional wall motion abnormalities. The left ventricular internal cavity  size was severely dilated. Left  ventricular diastolic parameters were normal.   3. Right ventricular systolic function is severely reduced. The right  ventricular size is moderately enlarged.   4. Left atrial size was moderately dilated.   5. Right atrial size was moderately dilated.   6. The mitral valve is grossly normal. Moderate mitral valve  regurgitation.   7. Tricuspid valve regurgitation is moderate.   8. The aortic valve is grossly normal. Aortic valve regurgitation is not  visualized.   Laboratory Data:  High Sensitivity Troponin:   Recent Labs  Lab 08/05/21 2101 08/06/21 0025  TROPONINIHS 20* 21*     Chemistry Recent Labs  Lab 08/05/21 2101 08/07/21 0639  NA 131* 136  K 4.2 4.6  CL 89* 97*  CO2 28 28  GLUCOSE 560* 205*  BUN  15 11  CREATININE 0.99 0.85   CALCIUM 9.4 9.5  GFRNONAA >60 >60  ANIONGAP 14 11    Recent Labs  Lab 08/05/21 2101  PROT 7.5  ALBUMIN 3.6  AST 47*  ALT 53*  ALKPHOS 92  BILITOT 0.7   Hematology Recent Labs  Lab 08/05/21 2101 08/07/21 0639  WBC 11.5* 13.5*  RBC 4.64 4.48  HGB 13.8 13.2  HCT 40.3 38.5  MCV 86.9 85.9  MCH 29.7 29.5  MCHC 34.2 34.3  RDW 13.0 13.1  PLT 287 250   BNPNo results for input(s): BNP, PROBNP in the last 168 hours.  DDimer No results for input(s): DDIMER in the last 168 hours.   Radiology/Studies:  DG Pelvis 1-2 Views  Result Date: 08/06/2021 CLINICAL DATA:  Recent fall with pelvic pain, initial encounter EXAM: PELVIS - 1 VIEW COMPARISON:  None. FINDINGS: Pelvic ring is intact. Degenerative changes of the hip joints are seen. No definitive fracture is noted. No soft tissue changes are seen. IMPRESSION: No acute abnormality noted. Electronically Signed   By: Alcide Clever M.D.   On: 08/06/2021 01:37   DG Knee 1-2 Views Right  Result Date: 08/06/2021 CLINICAL DATA:  Recent fall with right knee pain, initial encounter EXAM: RIGHT KNEE - -2 VIEW COMPARISON:  None. FINDINGS: No evidence of fracture, dislocation, or joint effusion. No evidence of arthropathy or other focal bone abnormality. Soft tissues are unremarkable. IMPRESSION: No acute abnormality noted. Electronically Signed   By: Alcide Clever M.D.   On: 08/06/2021 01:38   CT Head Wo Contrast  Result Date: 08/05/2021 CLINICAL DATA:  Altered mental status. EXAM: CT HEAD WITHOUT CONTRAST TECHNIQUE: Contiguous axial images were obtained from the base of the skull through the vertex without intravenous contrast. COMPARISON:  None. FINDINGS: Brain: No evidence of acute infarction, hemorrhage, hydrocephalus, extra-axial collection or mass lesion/mass effect. Vascular: No hyperdense vessel or unexpected calcification. Skull: Normal. Negative for fracture or focal lesion. Sinuses/Orbits: No acute finding. Other: None. IMPRESSION: No acute  intracranial abnormality. Electronically Signed   By: Aram Candela M.D.   On: 08/05/2021 21:21   DG Chest Portable 1 View  Result Date: 08/05/2021 CLINICAL DATA:  Weakness EXAM: PORTABLE CHEST 1 VIEW COMPARISON:  07/06/2021 FINDINGS: The heart size and mediastinal contours are within normal limits. Both lungs are clear. No pleural effusion. The visualized skeletal structures are unremarkable. IMPRESSION: No acute process in chest. Electronically Signed   By: Guadlupe Spanish M.D.   On: 08/05/2021 20:50   DG Shoulder Left  Result Date: 08/06/2021 CLINICAL DATA:  Recent fall with left shoulder pain, initial encounter EXAM: LEFT SHOULDER - 2+ VIEW COMPARISON:  None. FINDINGS: Degenerative changes of the acromioclavicular joint are seen. No acute fracture or dislocation is noted. No soft tissue changes are seen. Underlying bony thorax appears within normal limits. IMPRESSION: Degenerative change without acute abnormality. Electronically Signed   By: Alcide Clever M.D.   On: 08/06/2021 01:37     Assessment and Plan:   AMS / Near syncope v syncope --Suspect likely due to underlying UTI and hyperglycemia with tx per IM.  Recommend continue to monitor on telemetry.  Orthostatics.  Monitor vitals.  Avoid QT prolonging medications as below.  No indication for repeat echo.  Continue midodrine, ivabradine.  At discharge, could consider outpatient cardiac monitoring.  Prolonged QTC H/o WCT/NSVT --Avoid QT prolonging medications, which increases risk of arrhythmia.  She is on several QT prolonging medications including amiodarone, Cymbalta, haldol. Maintain electrolytes at goal.  Correct hyperglycemia, which influences electrolyte balance. Serial EKGs. Monitor on telemetry.  Recent PE, RV failure -- Recent diagnosis of pulmonary embolism as outlined in HPI above.  Continue OAC with Eliquis 5 mg twice daily.  Chronic combined systolic and diastolic heart failure --No reported symptoms consistent with  volume overload.  LVEF less than 20%.  Possible stress cardiomyopathy.  Catheterization 06/2021 with normal Cors.  As above, no indication for repeat echo.  Continue PTA medications.  For questions or updates, please contact [facility] Please consult [email or web address] for contact info under    Signed, Lennon Alstrom, PA-C  08/07/2021 8:37 AM

## 2021-08-07 NOTE — ED Notes (Signed)
Bed remains not ready.  Tru-D clean continues.

## 2021-08-07 NOTE — Progress Notes (Signed)
Live Zio AT to be placed for syncope before discharge.

## 2021-08-07 NOTE — Progress Notes (Signed)
Inpatient Diabetes Program Recommendations  AACE/ADA: New Consensus Statement on Inpatient Glycemic Control (2015)  Target Ranges:  Prepandial:   less than 140 mg/dL      Peak postprandial:   less than 180 mg/dL (1-2 hours)      Critically ill patients:  140 - 180 mg/dL   Results for PAYTEN, HOBIN (MRN [id number]) as of 08/07/2021 12:24  Ref. Range 08/06/2021 08:04 08/06/2021 12:03 08/06/2021 16:29 08/06/2021 19:55 08/06/2021 23:51 08/07/2021 03:58 08/07/2021 07:47 08/07/2021 11:42  Glucose-Capillary Latest Ref Range: 70 - 99 mg/dL 099 (H) 833 (H) 825 (H) 236 (H) 194 (H) 176 (H) 213 (H) 317 (H)  Results for VOLANDA, MANGINE (MRN [id number]) as of 08/07/2021 12:24  Ref. Range 07/06/2021 07:25 08/05/2021 21:01  Hemoglobin A1C Latest Ref Range: 4.8 - 5.6 % 6.2 (H) 9.9 (H)    Review of Glycemic Control  Diabetes history: DM2 Outpatient Diabetes medications: Glipizide 5 mg BID Current orders for Inpatient glycemic control: Novolog 0-15 units Q4H  Inpatient Diabetes Program Recommendations:    Insulin: Please consider ordering Semglee 8 units Q24H (based on 85 kg x 0.1 units).  HbgA1C:  A1C 9.9% on 08/05/21 and prior A1C 6.2% on 07/06/21. Noted in reviewing chart that patient was inpatient 07/04/21 to 07/12/21 and when patient was discharged on 07/12/21 she was instructed to stop Metformin (due to lactic acidosis) and continue Glipizide.  May need to consider discharging on additional oral DM medication and have patient follow up with PCP.  Thanks, Orlando Penner, RN, MSN, CDE Diabetes Coordinator Inpatient Diabetes Program [phone] (Team Pager from 8am to 5pm)

## 2021-08-07 NOTE — ED Notes (Signed)
Lunch tray delivered.

## 2021-08-07 NOTE — ED Notes (Signed)
Pt is ambulating w/ walker to the restroom. No other needs found at this moment.

## 2021-08-07 NOTE — Progress Notes (Signed)
[facility] Progress Note  Patient: Martha Ramos    OIZ:[id number]  DOA: 08/05/2021     Date of Service: the patient was seen and examined on 08/07/2021  Chief Complaint  Patient presents with   Altered Mental Status   Brief hospital course: Martha Ramos is a 50 y.o. female with medical history significant for asthma, type 2 diabetes, hypertension, CAD, pulmonary embolism on Eliquis, chronic systolic CHF, nonsustained ventricular tachycardia on amiodarone, schizoaffective disorder, bipolar disorder who presents with concerns of syncope and blackout. ED Course: She was afebrile and had occasional soft blood pressure of 90 over 60s.  Sodium of 131, potassium of 4.2, BG of 560 with no anion gap. AST of 47, ALT of 53   UA showed large leukocyte, negative nitrite  CT head negative   Assessment and Plan:  Syncope with loss of consciousness CT head negative for any acute finding -pt w/ recent hx of NSVT started on amiodarone.  Continue telemetry  -last echo in July with EF of 20%. Will repeat.  -orthostatic vital signs -TSH 0.8 wnl at lower end, patient is on amiodarone, continue to monitor thyroid functions as an outpatient Cardiology consulted, recommended Zio patch on discharge, monitoring telemetry during inpatient stay, no further work-up   Borderline low BP -soft BP systolic 90s in ED -hold antihpertensives  -continue midodrine   Pain from fall - right shoulder, left knee and pelvic X-ray negative for acute findings   Elevated LFTs -mildly elevated. unclear but just recently placed on amiodarone.  Follow with repeat lab tomorrow   Recently dx UTI UA here with large leukocyte, negative continue with Keflex  Ucx <10K colonies   Type 2 DM with hyperglycemia -BG of 560. Has received 15 units aspart in ED.  Will continue with  q4hr correctional sliding scale     Recent PE -continue Eliquis   Chronic systolic heart failure -continue Ivabradine,  metoprolol, digoxin but hold Losartan and Torsemide for now with soft BP   Hx of NSVT -continue amiodarone   Schizoaffective/bipolar disorder - Continue home Haldol   Hx of asthma - Continue as needed albuterol   Body mass index is 32.17 kg/m.  Interventions:      Diet: Heart healthy/carb modified DVT Prophylaxis: Therapeutic Anticoagulation with Eliquis    Advance goals of care discussion: Full code  Family Communication: family was not present at bedside, at the time of interview.  The pt provided permission to discuss medical plan with the family. Opportunity was given to ask question and all questions were answered satisfactorily.   Disposition:  Pt is from Home, admitted with fall, syncope, progress, still has high blood glucose, cardiology recommended to monitor on telemetry and Zio patch on discharge, which precludes a safe discharge. Discharge to home, when blood glucose stable, most likely in 1 to 2 days.  Subjective: No overnight issues, patient was admitted secondary to syncope.  Patient still feels generalized tiredness and weak, no any other specific symptoms, no chest pain or palpitation, no shortness of breath Patient just feels generalized body ache, soreness   Physical Exam: General:  alert oriented to time, place, and person.  Appear in no distress, affect appropriate Eyes: PERRLA ENT: Oral Mucosa Clear, moist  Neck: no JVD,  Cardiovascular: S1 and S2 Present, no Murmur,  Respiratory: good respiratory effort, Bilateral Air entry equal and Decreased, no Crackles, no wheezes Abdomen: Bowel Sound present, Soft and no tenderness,  Skin: no rashes Extremities: no Pedal edema, no calf tenderness Neurologic: without  any new focal findings Gait not checked due to patient safety concerns  Vitals:   08/07/21 1130 08/07/21 1317 08/07/21 1500 08/07/21 1701  BP: 132/67 122/72  138/78  Pulse: 69 63 68 (!) 47  Resp: (!) 25 20 (!) 21 14  Temp:  97.9 F (36.6 C)   97.8 F (36.6 C)  TempSrc:  Oral    SpO2: 98% 97% 96% 100%  Weight:      Height:        Intake/Output Summary (Last 24 hours) at 08/07/2021 1723 Last data filed at 08/07/2021 1709 Gross per 24 hour  Intake 240 ml  Output --  Net 240 ml   Filed Weights   08/05/21 2043  Weight: 85 kg    Data Reviewed: I have personally reviewed and interpreted daily labs, tele strips, imagings as discussed above. I reviewed all nursing notes, pharmacy notes, vitals, pertinent old records I have discussed plan of care as described above with RN and patient/family.  CBC: Recent Labs  Lab 08/05/21 2101 08/07/21 0639  WBC 11.5* 13.5*  NEUTROABS 7.6 8.9*  HGB 13.8 13.2  HCT 40.3 38.5  MCV 86.9 85.9  PLT 287 250   Basic Metabolic Panel: Recent Labs  Lab 08/05/21 2101 08/05/21 2235 08/07/21 0639  NA 131*  --  136  K 4.2  --  4.6  CL 89*  --  97*  CO2 28  --  28  GLUCOSE 560*  --  205*  BUN 15  --  11  CREATININE 0.99  --  0.85  CALCIUM 9.4  --  9.5  MG  --  2.1  --   PHOS  --   --  4.6    Studies: No results found.  Scheduled Meds:  amiodarone  200 mg Oral Daily   apixaban  5 mg Oral BID   atorvastatin  20 mg Oral Daily   cephALEXin  500 mg Oral QID   digoxin  0.25 mg Oral Daily   DULoxetine  60 mg Oral Daily   haloperidol  10 mg Oral QHS   insulin aspart  0-15 Units Subcutaneous Q4H   ivabradine  5 mg Oral BID WC   metoprolol succinate  25 mg Oral Daily   midodrine  10 mg Oral TID WC   pantoprazole  40 mg Oral Daily   potassium chloride SA  20 mEq Oral Daily   sodium chloride flush  3 mL Intravenous Q12H   torsemide  20 mg Oral Daily   Continuous Infusions: PRN Meds: acetaminophen  Time spent: 35 minutes  Author: Gillis Santa. MD [facility] 08/07/2021 5:23 PM  To reach On-call, see care teams to locate the attending and reach out to them via [email or web address]. If 7PM-7AM, please contact night-coverage If you still have difficulty reaching the attending  provider, please page the [facility] (Director on Call) for [facility] on amion for assistance.

## 2021-08-08 LAB — CBC WITH DIFFERENTIAL/PLATELET
Abs Immature Granulocytes: 0.08 10*3/uL — ABNORMAL HIGH (ref 0.00–0.07)
Basophils Absolute: 0.1 10*3/uL (ref 0.0–0.1)
Basophils Relative: 1 %
Eosinophils Absolute: 0.2 10*3/uL (ref 0.0–0.5)
Eosinophils Relative: 1 %
HCT: 39.8 % (ref 36.0–46.0)
Hemoglobin: 13.4 g/dL (ref 12.0–15.0)
Immature Granulocytes: 1 %
Lymphocytes Relative: 21 %
Lymphs Abs: 2.9 10*3/uL (ref 0.7–4.0)
MCH: 30.4 pg (ref 26.0–34.0)
MCHC: 33.7 g/dL (ref 30.0–36.0)
MCV: 90.2 fL (ref 80.0–100.0)
Monocytes Absolute: 1.3 10*3/uL — ABNORMAL HIGH (ref 0.1–1.0)
Monocytes Relative: 9 %
Neutro Abs: 9 10*3/uL — ABNORMAL HIGH (ref 1.7–7.7)
Neutrophils Relative %: 67 %
Platelets: 308 10*3/uL (ref 150–400)
RBC: 4.41 MIL/uL (ref 3.87–5.11)
RDW: 13.2 % (ref 11.5–15.5)
WBC: 13.4 10*3/uL — ABNORMAL HIGH (ref 4.0–10.5)
nRBC: 0 % (ref 0.0–0.2)

## 2021-08-08 LAB — BASIC METABOLIC PANEL
Anion gap: 11 (ref 5–15)
BUN: 11 mg/dL (ref 6–20)
CO2: 29 mmol/L (ref 22–32)
Calcium: 9.2 mg/dL (ref 8.9–10.3)
Chloride: 96 mmol/L — ABNORMAL LOW (ref 98–111)
Creatinine, Ser: 0.83 mg/dL (ref 0.44–1.00)
GFR, Estimated: >60 mL/min (ref >60)
Glucose, Bld: 159 mg/dL — ABNORMAL HIGH (ref 70–99)
Potassium: 4 mmol/L (ref 3.5–5.1)
Sodium: 136 mmol/L (ref 135–145)

## 2021-08-08 LAB — GLUCOSE, CAPILLARY
Glucose-Capillary: 132 mg/dL — ABNORMAL HIGH (ref 70–99)
Glucose-Capillary: 155 mg/dL — ABNORMAL HIGH (ref 70–99)
Glucose-Capillary: 231 mg/dL — ABNORMAL HIGH (ref 70–99)
Glucose-Capillary: 295 mg/dL — ABNORMAL HIGH (ref 70–99)
Glucose-Capillary: 313 mg/dL — ABNORMAL HIGH (ref 70–99)

## 2021-08-08 LAB — HEPATIC FUNCTION PANEL
ALT: 36 U/L (ref 0–44)
AST: 28 U/L (ref 15–41)
Albumin: 3.5 g/dL (ref 3.5–5.0)
Alkaline Phosphatase: 83 U/L (ref 38–126)
Bilirubin, Direct: 0.2 mg/dL (ref 0.0–0.2)
Indirect Bilirubin: 0.4 mg/dL (ref 0.3–0.9)
Total Bilirubin: 0.6 mg/dL (ref 0.3–1.2)
Total Protein: 7 g/dL (ref 6.5–8.1)

## 2021-08-08 LAB — MAGNESIUM: Magnesium: 2 mg/dL (ref 1.7–2.4)

## 2021-08-08 LAB — PHOSPHORUS: Phosphorus: 4.4 mg/dL (ref 2.5–4.6)

## 2021-08-08 NOTE — Progress Notes (Signed)
Progress Note  Patient Name: Martha Ramos Date of Encounter: 08/08/2021  Primary Cardiologist: Julien Nordmann, MD   Subjective   Contacted prior to discharge given prolonged QTC at admission.  Rounded on patient.  No chest pain or shortness of breath reported.  She is quite somnolent on exam.  Recommendations as below.  Inpatient Medications    Scheduled Meds:  amiodarone  200 mg Oral Daily   apixaban  5 mg Oral BID   atorvastatin  20 mg Oral Daily   cephALEXin  500 mg Oral QID   digoxin  0.25 mg Oral Daily   DULoxetine  60 mg Oral Daily   haloperidol  10 mg Oral QHS   insulin aspart  0-15 Units Subcutaneous Q4H   ivabradine  5 mg Oral BID WC   metoprolol succinate  25 mg Oral Daily   midodrine  10 mg Oral TID WC   pantoprazole  40 mg Oral Daily   potassium chloride SA  20 mEq Oral Daily   sodium chloride flush  3 mL Intravenous Q12H   torsemide  20 mg Oral Daily   Continuous Infusions:  PRN Meds: acetaminophen   Vital Signs    Vitals:   08/07/21 1701 08/08/21 0414 08/08/21 0734 08/08/21 1209  BP: 138/78 112/72 108/69 114/64  Pulse: (!) 47 88 79 70  Resp: 14 16 18 16   Temp: 97.8 F (36.6 C) 97.7 F (36.5 C) 98.1 F (36.7 C) 98.5 F (36.9 C)  TempSrc:  Oral    SpO2: 100% 100% 96% 99%  Weight:  87.9 kg    Height:        Intake/Output Summary (Last 24 hours) at 08/08/2021 1255 Last data filed at 08/08/2021 1148 Gross per 24 hour  Intake 1080 ml  Output 600 ml  Net 480 ml   Last 3 Weights 08/08/2021 08/05/2021 07/30/2021  Weight (lbs) 193 lb 12.8 oz 187 lb 6.3 oz 187 lb 8 oz  Weight (kg) 87.907 kg 85 kg 85.049 kg      Telemetry    Sinus rhythm, earlier bigeminy- Personally Reviewed  ECG    Pending repeat EKG- Personally Reviewed  Physical Exam   GEN: No acute distress.  Somnolent Neck: No JVD Cardiac: RRR, 1/6 systolic murmur LLSB, rubs, or gallops.  Live Zio in place Respiratory: Clear to auscultation bilaterally. GI: Soft, nontender,  non-distended  MS: No edema; No deformity. Neuro:  Nonfocal  Psych: Normal affect   Labs    High Sensitivity Troponin:   Recent Labs  Lab 08/05/21 2101 08/06/21 0025  TROPONINIHS 20* 21*      Chemistry Recent Labs  Lab 08/05/21 2101 08/07/21 0639 08/08/21 0508  NA 131* 136 136  K 4.2 4.6 4.0  CL 89* 97* 96*  CO2 28 28 29   GLUCOSE 560* 205* 159*  BUN 15 11 11   CREATININE 0.99 0.85 0.83  CALCIUM 9.4 9.5 9.2  PROT 7.5  --  7.0  ALBUMIN 3.6  --  3.5  AST 47*  --  28  ALT 53*  --  36  ALKPHOS 92  --  83  BILITOT 0.7  --  0.6  GFRNONAA >60 >60 >60  ANIONGAP 14 11 11      Hematology Recent Labs  Lab 08/05/21 2101 08/07/21 0639 08/08/21 0508  WBC 11.5* 13.5* 13.4*  RBC 4.64 4.48 4.41  HGB 13.8 13.2 13.4  HCT 40.3 38.5 39.8  MCV 86.9 85.9 90.2  MCH 29.7 29.5 30.4  MCHC 34.2 34.3  33.7  RDW 13.0 13.1 13.2  PLT 287 250 308    BNPNo results for input(s): BNP, PROBNP in the last 168 hours.   DDimer No results for input(s): DDIMER in the last 168 hours.   Radiology    No results found.  Cardiac Studies   LHC 07/09/2021 LV end diastolic pressure is normal. Angiographically normal coronary arteries Normal LVEDP Systemic hypotension Pt given 500 cc 0.9% NS in the cath lab for treatment of hypotension. Resume heparin for treatment of PE 2 hours after TR band off. OK to transition to DOAC tomorrow if no bleeding complications arise.    Echo 07/05/2021  1. Severely dilated systolic cardiomyopathy.   2. Left ventricular ejection fraction, by estimation, is <20%. The left  ventricle has severely decreased function. The left ventricle has no  regional wall motion abnormalities. The left ventricular internal cavity  size was severely dilated. Left  ventricular diastolic parameters were normal.   3. Right ventricular systolic function is severely reduced. The right  ventricular size is moderately enlarged.   4. Left atrial size was moderately dilated.   5.  Right atrial size was moderately dilated.   6. The mitral valve is grossly normal. Moderate mitral valve  regurgitation.   7. Tricuspid valve regurgitation is moderate.   8. The aortic valve is grossly normal. Aortic valve regurgitation is not  visualized.   Patient Profile     50 y.o. female  hx of HFrEF, right lower lobe pulmonary embolism, WCT, NSVT, diabetes, hypertension, coronary artery disease, bipolar disorder, asthma, and who is being seen today for the evaluation of syncope.  Assessment & Plan    AMS / Syncope --Suspect likely due to underlying UTI and hyperglycemia with tx per IM.  Considered also is vasovagal / hypotension. TSH wnl. Bigeminy noted on telemetry with patient continued on amiodarone and further recommendations as below regarding Qtc. Live Zio in place -cardiac monitoring to be completed for 2 weeks to exclude arrhythmia not captured on telemetry. Previous echo above.  Recommend follow-up after completion of monitoring in the [facility] office.    Prolonged QTC H/o WCT/NSVT -- Per telemetry measurement, QTC improved.  Avoid QT prolonging medications, which increases risk of arrhythmia.  She is on several QT prolonging medications including amiodarone, Cymbalta, haldol. Given improved Qtc, we will continue amiodarone. Repeat EKG pending. Maintain electrolytes at goal.  Hyperglycemia now corrected, which can also influence QTc.   Recent PE, RV failure -- Recent diagnosis of pulmonary embolism as outlined in HPI above.  Continue OAC with Eliquis 5 mg twice daily.   Chronic combined systolic and diastolic heart failure --No reported symptoms consistent with volume overload.  LVEF less than 20%.  Possible stress cardiomyopathy.  Catheterization 06/2021 with normal Cors.  As above, no indication for repeat echo.  Continue PTA medications including ivabradine, digoxin, torsemide. Toprol and Losartan held due to hypotension.  For questions or updates, please contact [facility] Please consult [email or web address] for contact info under        Signed, Lennon Alstrom, PA-C  08/08/2021, 12:55 PM

## 2021-08-08 NOTE — Progress Notes (Signed)
Discharge instructions explained/pt verbalized understanding. IV and tele removed. Will transport off unit via wheelchair when ride arrives.  

## 2021-08-08 NOTE — Progress Notes (Addendum)
Inpatient Diabetes Program Recommendations  AACE/ADA: New Consensus Statement on Inpatient Glycemic Control (2015)  Target Ranges:  Prepandial:   less than 140 mg/dL      Peak postprandial:   less than 180 mg/dL (1-2 hours)      Critically ill patients:  140 - 180 mg/dL   Lab Results  Component Value Date   GLUCAP 231 (H) 08/08/2021   HGBA1C 9.9 (H) 08/05/2021    Review of Glycemic Control Results for BLAKLEE, SHORES (MRN [id number]) as of 08/08/2021 11:26  Ref. Range 08/07/2021 03:58 08/07/2021 07:47 08/07/2021 11:42 08/07/2021 17:07 08/07/2021 20:56 08/08/2021 00:42 08/08/2021 04:29 08/08/2021 07:35  Glucose-Capillary Latest Ref Range: 70 - 99 mg/dL 891 (H) 694 (H) 503 (H) 214 (H) 302 (H) 313 (H) 132 (H) 231 (H)  Diabetes history: DM2 Outpatient Diabetes medications: Glipizide 5 mg BID Current orders for Inpatient glycemic control: Novolog 0-15 units Q4H  Inpatient Diabetes Program Recommendations:    Please consider adding Semglee 12 units daily.  Also please change Novolog correction to tid with meals and HS scale (instead of q 4 hours).  May consider also adding Novolog meal coverage 3 units tid with meals.   Thanks,  Beryl Meager, RN, BC-ADM Inpatient Diabetes Coordinator Pager [phone]  (8a-5p)  Addendum:  Spoke with patient and patient's daughter by phone.  Daughter-Tyja states that she has a difficult time getting her mother to take her medications and some days she refuses.  She also states that patient complains of numbness and weakness in her legs preventing her from walking and getting around very well.  They need a glucose meter at home for monitoring as well. Both patient and daughter do not want patient to be on insulin at home.  Discussed importance of taking medications at home and reducing CHO intake including sugar in patient's beverages.  Will follow.

## 2021-08-08 NOTE — Discharge Summary (Signed)
[facility] Discharge Summary   Patient: Martha Ramos KZL:[id number]  PCP: Darnelle Spangle, MD  Date of admission: 08/05/2021   Date of discharge:  08/08/2021     Discharge Diagnoses:   Principal Problem:   Syncope Active Problems:   Uncontrolled type 2 diabetes mellitus with hyperglycemia (HCC)   Acute lower UTI   History of pulmonary embolism   Chronic systolic CHF (congestive heart failure) (HCC)   Schizoaffective disorder (HCC)   Asthma   Admitted From:  Home Disposition:  Home   Recommendations for Outpatient Follow-up:  PCP: in 1 wk F/u cardio in 1 wk Psych in 1wk  Follow up LABS/TEST:     Diet recommendation: Cardiac diet and diabetic diet  Activity: The patient is advised to gradually reintroduce usual activities, as tolerated  Discharge Condition: stable  Code Status: Full code   History of present illness: As per the H and P dictated on admission Hospital Course:  Martha Ramos is a 50 y.o. female with medical history significant for asthma, type 2 diabetes, hypertension, CAD, pulmonary embolism on Eliquis, chronic systolic CHF, nonsustained ventricular tachycardia on amiodarone, schizoaffective disorder, bipolar disorder who presents with concerns of syncope and blackout. ED Course: She was afebrile and had occasional soft blood pressure of 90 over 60s.  Sodium of 131, potassium of 4.2, BG of 560 with no anion gap. AST of 47, ALT of 53 UA showed large leukocyte, negative nitrite CT head negative  Assessment and Plan: # Syncope with loss of consciousness. CT head negative for any acute finding pt w/ recent hx of NSVT and she was started on amiodarone. Patient was monitored on telemetry   last echo in July with EF of 20%, cardiology was consulted, recommended Zio patch for rhythm monitoring, no further work-up.  Patient ambulated without any issues as per RN. TSH 0.8 wnl at lower end, patient is on amiodarone, continue to monitor thyroid  functions as an outpatient.  Patient was cleared by cardiology to follow-up as an outpatient in 1 week. # Borderline low BP, -soft BP systolic 90s in ED, antihpertensive medications were held, midodrine was continued.  Blood pressure improved and home medications were resumed.  Patient was advised to monitor BP at home. # Pain from fall, right shoulder, left knee and pelvic X-ray negative for acute findings.  Continue Tylenol as needed for pain control # Elevated LFTs, -mildly elevated, unknown cause.  Repeated LFTs which were within normal limits.    # Recently dx UTI, UA here with large leukocyte, negative, continue with Keflex. Ucx <10K colonies  # Type 2 DM with hyperglycemia, BG of 560, patient was given insulin and sliding scale, blood glucose improved.  Patient was advised to monitor blood glucose closely at home and continue home medications.  Follow with PCP and endocrine for further management.  Hemoglobin A1c 9.9, seems to be poorly controlled.  Continue diabetic diet # Recent PE, continue Eliquis # Chronic systolic heart failure, continue Ivabradine, metoprolol, digoxin but hold Losartan and Torsemide for now with soft BP # Hx of NSVT, continue amiodarone # Schizoaffective/bipolar disorder, Continue home Haldol # Hx of asthma, Continue as needed albuterol Body mass index is 33.27 kg/m.  Nutrition Interventions:   - Patient was instructed, not to drive, operate heavy machinery, perform activities at heights, swimming or participation in water activities or provide baby sitting services while on Pain, Sleep and Anxiety Medications; until her outpatient Physician has advised to do so again.  - Also recommended to not to  take more than prescribed Pain, Sleep and Anxiety Medications.  Patient was ambulatory without any assistance. On the day of the discharge the patient's vitals were stable, and no other acute medical condition were reported by patient. the patient was felt safe to be  discharge at Home.  Consultants: Cardio and got opinion from psych regarding Haldol which was continued because of concern of QT prolongation, but repeat EKG did not show QT prolongation. Procedures: None  Discharge Exam: General: Appear in no distress, no Rash; Oral Mucosa Clear, moist. Cardiovascular: S1 and S2 Present, no Murmur, Respiratory: normal respiratory effort, Bilateral Air entry present and no Crackles, no wheezes Abdomen: Bowel Sound present, Soft and no tenderness, no hernia Extremities: no Pedal edema, no calf tenderness Neurology: alert and oriented to time, place, and person affect appropriate.  Filed Weights   08/05/21 2043 08/08/21 0414  Weight: 85 kg 87.9 kg   Vitals:   08/08/21 0734 08/08/21 1209  BP: 108/69 114/64  Pulse: 79 70  Resp: 18 16  Temp: 98.1 F (36.7 C) 98.5 F (36.9 C)  SpO2: 96% 99%    DISCHARGE MEDICATION: Allergies as of 08/08/2021   No Known Allergies      Medication List     TAKE these medications    acetaminophen 650 MG CR tablet Commonly known as: TYLENOL Take 650 mg by mouth every 8 (eight) hours as needed for pain.   amiodarone 200 MG tablet Commonly known as: PACERONE Take 1 tablet (200 mg total) by mouth daily.   apixaban 5 MG Tabs tablet Commonly known as: ELIQUIS Take 1 tablet (5 mg total) by mouth 2 (two) times daily for 23 days. Specifically: Take 10mg  twice daily (7/22 PM-7/27 PM); then start 5mg  twice daily dosing (7/28 AM).   atorvastatin 20 MG tablet Commonly known as: LIPITOR Take 1 tablet by mouth daily.   cephALEXin 500 MG capsule Commonly known as: KEFLEX Take 500 mg by mouth 4 (four) times daily.   digoxin 0.25 MG tablet Commonly known as: LANOXIN Take 1 tablet (0.25 mg total) by mouth daily.   DULoxetine 60 MG capsule Commonly known as: CYMBALTA Take 60 mg by mouth daily.   glipiZIDE 5 MG tablet Commonly known as: GLUCOTROL Take 5 mg by mouth 2 (two) times daily.   haloperidol 10 MG  tablet Commonly known as: HALDOL Take 10 mg by mouth at bedtime.   ivabradine 5 MG Tabs tablet Commonly known as: CORLANOR Take 1 tablet (5 mg total) by mouth 2 (two) times daily with a meal.   K-Phos 500 MG tablet Generic drug: potassium phosphate (monobasic) Take 500 mg by mouth 2 (two) times daily.   losartan 25 MG tablet Commonly known as: COZAAR Take 1 tablet (25 mg total) by mouth daily. Skip dose if SBP <120 mmHg What changed: additional instructions   metoprolol succinate 25 MG 24 hr tablet Commonly known as: TOPROL-XL Take 1 tablet (25 mg total) by mouth daily.   midodrine 10 MG tablet Commonly known as: PROAMATINE Take 1 tablet (10 mg total) by mouth 3 (three) times daily with meals.   omeprazole 20 MG capsule Commonly known as: PRILOSEC Take 20 mg by mouth 2 (two) times daily.   potassium chloride SA 20 MEQ tablet Commonly known as: KLOR-CON Take 20 mEq by mouth daily.   torsemide 20 MG tablet Commonly known as: DEMADEX Take 1 tablet (20 mg total) by mouth daily.   Ventolin HFA 108 (90 Base) MCG/ACT inhaler Generic drug: albuterol Inhale  1-2 puffs into the lungs every 4 (four) hours as needed.       No Known Allergies Discharge Instructions     Call MD for:  difficulty breathing, headache or visual disturbances   Complete by: As directed    Call MD for:  extreme fatigue   Complete by: As directed    Call MD for:  persistant dizziness or light-headedness   Complete by: As directed    Call MD for:  persistant nausea and vomiting   Complete by: As directed    Call MD for:  severe uncontrolled pain   Complete by: As directed    Call MD for:  temperature >100.4   Complete by: As directed    Diet - low sodium heart healthy   Complete by: As directed    Diet Carb Modified   Complete by: As directed    Discharge instructions   Complete by: As directed    Follow-up with PCP in 1 week, continue to monitor BP and follow with PCP to titrate medications  accordingly. Follow with cardiology in 1 to 2 weeks Follow with psychiatrist in 1 to 2 weeks. Follow With endocrinologist, continue monitor blood glucose trend continue home medications, continue diabetic diet.   Increase activity slowly   Complete by: As directed        The results of significant diagnostics from this hospitalization (including imaging, microbiology, ancillary and laboratory) are listed below for reference.    Significant Diagnostic Studies: DG Pelvis 1-2 Views  Result Date: 08/06/2021 CLINICAL DATA:  Recent fall with pelvic pain, initial encounter EXAM: PELVIS - 1 VIEW COMPARISON:  None. FINDINGS: Pelvic ring is intact. Degenerative changes of the hip joints are seen. No definitive fracture is noted. No soft tissue changes are seen. IMPRESSION: No acute abnormality noted. Electronically Signed   By: Alcide Clever M.D.   On: 08/06/2021 01:37   DG Knee 1-2 Views Right  Result Date: 08/06/2021 CLINICAL DATA:  Recent fall with right knee pain, initial encounter EXAM: RIGHT KNEE - -2 VIEW COMPARISON:  None. FINDINGS: No evidence of fracture, dislocation, or joint effusion. No evidence of arthropathy or other focal bone abnormality. Soft tissues are unremarkable. IMPRESSION: No acute abnormality noted. Electronically Signed   By: Alcide Clever M.D.   On: 08/06/2021 01:38   CT Head Wo Contrast  Result Date: 08/05/2021 CLINICAL DATA:  Altered mental status. EXAM: CT HEAD WITHOUT CONTRAST TECHNIQUE: Contiguous axial images were obtained from the base of the skull through the vertex without intravenous contrast. COMPARISON:  None. FINDINGS: Brain: No evidence of acute infarction, hemorrhage, hydrocephalus, extra-axial collection or mass lesion/mass effect. Vascular: No hyperdense vessel or unexpected calcification. Skull: Normal. Negative for fracture or focal lesion. Sinuses/Orbits: No acute finding. Other: None. IMPRESSION: No acute intracranial abnormality. Electronically Signed    By: Aram Candela M.D.   On: 08/05/2021 21:21   DG Chest Portable 1 View  Result Date: 08/05/2021 CLINICAL DATA:  Weakness EXAM: PORTABLE CHEST 1 VIEW COMPARISON:  07/06/2021 FINDINGS: The heart size and mediastinal contours are within normal limits. Both lungs are clear. No pleural effusion. The visualized skeletal structures are unremarkable. IMPRESSION: No acute process in chest. Electronically Signed   By: Guadlupe Spanish M.D.   On: 08/05/2021 20:50   DG Shoulder Left  Result Date: 08/06/2021 CLINICAL DATA:  Recent fall with left shoulder pain, initial encounter EXAM: LEFT SHOULDER - 2+ VIEW COMPARISON:  None. FINDINGS: Degenerative changes of the acromioclavicular joint are seen. No  acute fracture or dislocation is noted. No soft tissue changes are seen. Underlying bony thorax appears within normal limits. IMPRESSION: Degenerative change without acute abnormality. Electronically Signed   By: Alcide CleverMark  Lukens M.D.   On: 08/06/2021 01:37    Microbiology: Recent Results (from the past 240 hour(s))  Urine Culture     Status: Abnormal   Collection Time: 08/05/21  9:29 PM   Specimen: Urine, Clean Catch  Result Value Ref Range Status   Specimen Description   Final    URINE, CLEAN CATCH Performed at [facility], [address] [place], [place] [address]    Special Requests   Final    NONE Performed at [facility], [address] [place], [place] [address]    Culture (A)  Final    <10,000 COLONIES/mL INSIGNIFICANT GROWTH Performed at [facility], [address] [place], [place] [address]    Report Status 08/07/2021 FINAL  Final  Resp Panel by RT-PCR (Flu A&B, Covid) Nasopharyngeal Swab     Status: None   Collection Time: 08/06/21 12:25 AM   Specimen: Nasopharyngeal Swab; Nasopharyngeal(NP) swabs in vial transport medium  Result Value Ref Range Status   SARS Coronavirus 2 by RT PCR NEGATIVE NEGATIVE Final    Comment: (NOTE) SARS-CoV-2 target nucleic acids  are NOT DETECTED.  The SARS-CoV-2 RNA is generally detectable in upper respiratory specimens during the acute phase of infection. The lowest concentration of SARS-CoV-2 viral copies this assay can detect is 138 copies/mL. A negative result does not preclude SARS-Cov-2 infection and should not be used as the sole basis for treatment or other patient management decisions. A negative result may occur with  improper specimen collection/handling, submission of specimen other than nasopharyngeal swab, presence of viral mutation(s) within the areas targeted by this assay, and inadequate number of viral copies(<138 copies/mL). A negative result must be combined with clinical observations, patient history, and epidemiological information. The expected result is Negative.  Fact Sheet for Patients:  BloggerCourse.comhttps://www.fda.gov/media/152166/download  Fact Sheet for Healthcare Providers:  SeriousBroker.ithttps://www.fda.gov/media/152162/download  This test is no t yet approved or cleared by the [place] FDA and  has been authorized for detection and/or diagnosis of SARS-CoV-2 by FDA under an Emergency Use Authorization (EUA). This EUA will remain  in effect (meaning this test can be used) for the duration of the COVID-19 declaration under Section 564(b)(1) of the Act, 21 U.S.C.section 360bbb-3(b)(1), unless the authorization is terminated  or revoked sooner.       Influenza A by PCR NEGATIVE NEGATIVE Final   Influenza B by PCR NEGATIVE NEGATIVE Final    Comment: (NOTE) The Xpert Xpress SARS-CoV-2/FLU/RSV plus assay is intended as an aid in the diagnosis of influenza from Nasopharyngeal swab specimens and should not be used as a sole basis for treatment. Nasal washings and aspirates are unacceptable for Xpert Xpress SARS-CoV-2/FLU/RSV testing.  Fact Sheet for Patients: BloggerCourse.comhttps://www.fda.gov/media/152166/download  Fact Sheet for Healthcare Providers: SeriousBroker.ithttps://www.fda.gov/media/152162/download  This test is  not yet approved or cleared by the [place] FDA and has been authorized for detection and/or diagnosis of SARS-CoV-2 by FDA under an Emergency Use Authorization (EUA). This EUA will remain in effect (meaning this test can be used) for the duration of the COVID-19 declaration under Section 564(b)(1) of the Act, 21 U.S.C. section 360bbb-3(b)(1), unless the authorization is terminated or revoked.  Performed at [facility], [address] [place], [place] [address]      Labs: CBC: Recent Labs  Lab 08/05/21  2101 08/07/21 0639 08/08/21 0508  WBC 11.5* 13.5* 13.4*  NEUTROABS 7.6 8.9* 9.0*  HGB 13.8 13.2 13.4  HCT 40.3 38.5 39.8  MCV 86.9 85.9 90.2  PLT 287 250 308   Basic Metabolic Panel: Recent Labs  Lab 08/05/21 2101 08/05/21 2235 08/07/21 0639 08/08/21 0508  NA 131*  --  136 136  K 4.2  --  4.6 4.0  CL 89*  --  97* 96*  CO2 28  --  28 29  GLUCOSE 560*  --  205* 159*  BUN 15  --  11 11  CREATININE 0.99  --  0.85 0.83  CALCIUM 9.4  --  9.5 9.2  MG  --  2.1  --  2.0  PHOS  --   --  4.6 4.4   Liver Function Tests: Recent Labs  Lab 08/05/21 2101 08/08/21 0508  AST 47* 28  ALT 53* 36  ALKPHOS 92 83  BILITOT 0.7 0.6  PROT 7.5 7.0  ALBUMIN 3.6 3.5   No results for input(s): LIPASE, AMYLASE in the last 168 hours. No results for input(s): AMMONIA in the last 168 hours. Cardiac Enzymes: No results for input(s): CKTOTAL, CKMB, CKMBINDEX, TROPONINI in the last 168 hours. BNP (last 3 results) Recent Labs    07/04/21 1942 07/05/21 0546 07/06/21 0837  BNP 1,128.3* 1,356.8* 1,430.3*   CBG: Recent Labs  Lab 08/07/21 2056 08/08/21 0042 08/08/21 0429 08/08/21 0735 08/08/21 1150  GLUCAP 302* 313* 132* 231* 295*    Time spent: 35 minutes  Signed:  Gillis Santa  [facility]  08/08/2021 3:32 PM

## 2021-08-14 ENCOUNTER — Ambulatory Visit: Payer: Medicare Other | Admitting: Nurse Practitioner

## 2021-08-14 ENCOUNTER — Encounter: Payer: Self-pay | Admitting: Nurse Practitioner

## 2021-08-14 DIAGNOSIS — E119 Type 2 diabetes mellitus without complications: Secondary | ICD-10-CM | POA: Insufficient documentation

## 2021-08-14 DIAGNOSIS — I071 Rheumatic tricuspid insufficiency: Secondary | ICD-10-CM | POA: Insufficient documentation

## 2021-08-14 DIAGNOSIS — I10 Essential (primary) hypertension: Secondary | ICD-10-CM | POA: Insufficient documentation

## 2021-08-14 DIAGNOSIS — I340 Nonrheumatic mitral (valve) insufficiency: Secondary | ICD-10-CM | POA: Insufficient documentation

## 2021-08-14 DIAGNOSIS — I5020 Unspecified systolic (congestive) heart failure: Secondary | ICD-10-CM | POA: Insufficient documentation

## 2021-08-14 NOTE — Progress Notes (Deleted)
Office Visit    Patient Name: Martha Ramos Date of Encounter: 08/14/2021  Primary Care Provider:  Darnelle Spangle, MD Primary Cardiologist:  Julien Nordmann, MD  Chief Complaint    50 year old female with a history of diabetes, hypertension, schizoaffective disorder, obesity, and asthma, who was recently admitted in the setting of dyspnea, shock, and right lower lobe pulmonary embolism with finding of HFrEF and nonischemic cardiomyopathy (EF less than 20%), as well as nonsustained VT requiring initiation of amiodarone.  She presents today for follow-up after hospitalization.  She was previously noted to have normal LV function by echocardiogram in 2021.  Past Medical History    Past Medical History:  Diagnosis Date   Asthma    Diabetes (HCC)    Essential hypertension    a. 06/2021 hypotensive in the setting of PE.   HFrEF (heart failure with reduced ejection fraction) (HCC)    a. 06/2021 Echo: EF <20%, no rwma, sev red RV fxn, mod BAE, mod MR/TR.   Hypotension    a. 06/2021 midodrine started.   Moderate mitral regurgitation    Moderate tricuspid regurgitation    Morbid obesity (HCC)    NICM (nonischemic cardiomyopathy) (HCC)    a. 06/2021 Echo: EF <20%; b. 06/2021 Cath: Nl cors.   NSVT (nonsustained ventricular tachycardia) (HCC)    a. 06/2021 placed on amio.   Schizoaffective disorder [facility])    Past Surgical History:  Procedure Laterality Date   LEFT HEART CATH AND CORONARY ANGIOGRAPHY N/A 07/09/2021   Procedure: LEFT HEART CATH AND CORONARY ANGIOGRAPHY;  Surgeon: Tonny Bollman, MD;  Location: [facility] INVASIVE CV LAB;  Service: Cardiovascular;  Laterality: N/A;    Allergies  No Known Allergies  History of Present Illness    50 year old female with the above complex past medical history including diabetes, hypertension, schizoaffective disorder, obesity, and asthma.  In July, she was admitted with acute right lower lobe PE with associated dyspnea and shock.   Echocardiogram revealed an EF of less than 20% with severely reduced RV function, moderate biatrial enlargement, and moderate MR/TR.  She required intensive care and intravenous milrinone therapy which was eventually weaned.  She had persistent hypotension requiring initiation of midodrine.  She also required intravenous diuresis of approximately -7 L.  Diagnostic catheterization on July 19 showed normal coronary arteries and normal LVEDP.  In the setting of hypotension she was given IV fluids.  Prior to discharge, blood pressures improved.  Midodrine was discontinued and low-dose metoprolol and losartan were able to be added.  She was also treated with digoxin and ivabradine in the setting of ongoing sinus tachycardia.  Amiodarone therapy was added in the setting of recurrent nonsustained VT.  Post catheterization, she was transitioned from heparin to Eliquis and subsequently discharged.  She was seen in heart failure clinic on August 9, she was overall stable.  Blood pressure was 103/74 heart rates were in the 90s.  Her metoprolol was increased to 25 mg daily.  Unfortunately, she required readmission on August 17 secondary to syncope which was felt to be secondary to underlying UTI and hyperglycemia.  QT was also prolonged on admission though this subsequently improved and therefore, amiodarone was continued.  A Zio AT was placed @ discharge on 8/17.  To date, there have been no significant arrhythmias.  Home Medications    Current Outpatient Medications  Medication Sig Dispense Refill   acetaminophen (TYLENOL) 650 MG CR tablet Take 650 mg by mouth every 8 (eight) hours as needed for pain.  amiodarone (PACERONE) 200 MG tablet Take 1 tablet (200 mg total) by mouth daily. 30 tablet 0   apixaban (ELIQUIS) 5 MG TABS tablet Take 1 tablet (5 mg total) by mouth 2 (two) times daily for 23 days. Specifically: Take 10mg  twice daily (7/22 PM-7/27 PM); then start 5mg  twice daily dosing (7/28 AM). 46 tablet 0    atorvastatin (LIPITOR) 20 MG tablet Take 1 tablet by mouth daily. (Patient not taking: Reported on 08/05/2021)     cephALEXin (KEFLEX) 500 MG capsule Take 500 mg by mouth 4 (four) times daily.     digoxin (LANOXIN) 0.25 MG tablet Take 1 tablet (0.25 mg total) by mouth daily. 30 tablet 0   DULoxetine (CYMBALTA) 60 MG capsule Take 60 mg by mouth daily.     glipiZIDE (GLUCOTROL) 5 MG tablet Take 5 mg by mouth 2 (two) times daily.     haloperidol (HALDOL) 10 MG tablet Take 10 mg by mouth at bedtime.     K-PHOS 500 MG tablet Take 500 mg by mouth 2 (two) times daily. (Patient not taking: Reported on 08/05/2021)     losartan (COZAAR) 25 MG tablet Take 1 tablet (25 mg total) by mouth daily. Skip dose if SBP <120 mmHg 30 tablet 0   metoprolol succinate (TOPROL-XL) 25 MG 24 hr tablet Take 1 tablet (25 mg total) by mouth daily. 30 tablet 5   omeprazole (PRILOSEC) 20 MG capsule Take 20 mg by mouth 2 (two) times daily.     potassium chloride SA (KLOR-CON) 20 MEQ tablet Take 20 mEq by mouth daily.     torsemide (DEMADEX) 20 MG tablet Take 1 tablet (20 mg total) by mouth daily. 30 tablet 0   VENTOLIN HFA 108 (90 Base) MCG/ACT inhaler Inhale 1-2 puffs into the lungs every 4 (four) hours as needed. (Patient not taking: Reported on 08/05/2021)     No current facility-administered medications for this visit.     Review of Systems    ***.  All other systems reviewed and are otherwise negative except as noted above.  Physical Exam    VS:  There were no vitals taken for this visit. , BMI There is no height or weight on file to calculate BMI.     GEN: Well nourished, well developed, in no acute distress. HEENT: normal. Neck: Supple, no JVD, carotid bruits, or masses. Cardiac: RRR, no murmurs, rubs, or gallops. No clubbing, cyanosis, edema.  Radials/DP/PT 2+ and equal bilaterally.  Respiratory:  Respirations regular and unlabored, clear to auscultation bilaterally. GI: Soft, nontender, nondistended, BS + x  4. MS: no deformity or atrophy. Skin: warm and dry, no rash. Neuro:  Strength and sensation are intact. Psych: Normal affect.  Accessory Clinical Findings    ECG personally reviewed by me today - *** - no acute changes.  Lab Results  Component Value Date   WBC 13.4 (H) 08/08/2021   HGB 13.4 08/08/2021   HCT 39.8 08/08/2021   MCV 90.2 08/08/2021   PLT 308 08/08/2021   Lab Results  Component Value Date   CREATININE 0.83 08/08/2021   BUN 11 08/08/2021   NA 136 08/08/2021   K 4.0 08/08/2021   CL 96 (L) 08/08/2021   CO2 29 08/08/2021   Lab Results  Component Value Date   ALT 36 08/08/2021   AST 28 08/08/2021   ALKPHOS 83 08/08/2021   BILITOT 0.6 08/08/2021   Lab Results  Component Value Date   CHOL 210 (H) 08/06/2021   HDL 39 (  L) 08/06/2021   LDLCALC 137 (H) 08/06/2021   TRIG 172 (H) 08/06/2021   CHOLHDL 5.4 08/06/2021    Lab Results  Component Value Date   HGBA1C 9.9 (H) 08/05/2021    Assessment & Plan    1.  ***   Nicolasa Ducking, NP 08/14/2021, 8:52 AM

## 2021-09-13 ENCOUNTER — Ambulatory Visit: Payer: Medicare Other | Admitting: Nurse Practitioner

## 2021-09-16 ENCOUNTER — Ambulatory Visit: Payer: Medicare Other | Attending: Family | Admitting: Family

## 2021-09-16 ENCOUNTER — Other Ambulatory Visit: Payer: Self-pay

## 2021-09-16 ENCOUNTER — Encounter: Payer: Self-pay | Admitting: Family

## 2021-09-16 VITALS — BP 108/86 | HR 77 | Resp 20 | Ht 64.0 in | Wt 192.4 lb

## 2021-09-16 DIAGNOSIS — F319 Bipolar disorder, unspecified: Secondary | ICD-10-CM

## 2021-09-16 DIAGNOSIS — I5022 Chronic systolic (congestive) heart failure: Secondary | ICD-10-CM | POA: Diagnosis not present

## 2021-09-16 DIAGNOSIS — E119 Type 2 diabetes mellitus without complications: Secondary | ICD-10-CM

## 2021-09-16 DIAGNOSIS — Z09 Encounter for follow-up examination after completed treatment for conditions other than malignant neoplasm: Secondary | ICD-10-CM | POA: Diagnosis not present

## 2021-09-16 DIAGNOSIS — R0602 Shortness of breath: Secondary | ICD-10-CM | POA: Insufficient documentation

## 2021-09-16 DIAGNOSIS — J45909 Unspecified asthma, uncomplicated: Secondary | ICD-10-CM | POA: Diagnosis not present

## 2021-09-16 DIAGNOSIS — Z7901 Long term (current) use of anticoagulants: Secondary | ICD-10-CM | POA: Insufficient documentation

## 2021-09-16 DIAGNOSIS — I10 Essential (primary) hypertension: Secondary | ICD-10-CM | POA: Diagnosis not present

## 2021-09-16 DIAGNOSIS — Z87891 Personal history of nicotine dependence: Secondary | ICD-10-CM | POA: Insufficient documentation

## 2021-09-16 DIAGNOSIS — N898 Other specified noninflammatory disorders of vagina: Secondary | ICD-10-CM

## 2021-09-16 DIAGNOSIS — R5383 Other fatigue: Secondary | ICD-10-CM | POA: Insufficient documentation

## 2021-09-16 DIAGNOSIS — I110 Hypertensive heart disease with heart failure: Secondary | ICD-10-CM | POA: Insufficient documentation

## 2021-09-16 DIAGNOSIS — I2510 Atherosclerotic heart disease of native coronary artery without angina pectoris: Secondary | ICD-10-CM | POA: Insufficient documentation

## 2021-09-16 DIAGNOSIS — Z79899 Other long term (current) drug therapy: Secondary | ICD-10-CM | POA: Diagnosis not present

## 2021-09-16 DIAGNOSIS — R300 Dysuria: Secondary | ICD-10-CM | POA: Insufficient documentation

## 2021-09-16 DIAGNOSIS — Z7984 Long term (current) use of oral hypoglycemic drugs: Secondary | ICD-10-CM | POA: Diagnosis not present

## 2021-09-16 DIAGNOSIS — L292 Pruritus vulvae: Secondary | ICD-10-CM | POA: Insufficient documentation

## 2021-09-16 DIAGNOSIS — Z8249 Family history of ischemic heart disease and other diseases of the circulatory system: Secondary | ICD-10-CM | POA: Insufficient documentation

## 2021-09-16 NOTE — Progress Notes (Signed)
Patient ID: Martha Ramos, female    DOB: 1971/09/15, 50 y.o.   MRN: [id number]  Ms Martha Ramos is a 50 y/o female with a history of asthma, CAD, DM, HTN, bipolar and chronic heart failure.   Echo report from 07/05/21 reviewed and showed an EF of <20% along with moderate MR.   LHC done 07/09/21 showed: Angiographically normal coronary arteries Normal LVEDP Systemic hypotension  Admitted 07/04/21 due to chest pain and shortness of breath. Cardiology consult obtained. CTA showed pulmonary embolus. Initially given IV lasix with transition to oral diuretics. Antibiotics given for possible pneumonia. Elevated troponin thought to be due to PE. Initially placed on heparin drip and then placed on eliquis. Cath completed with normal coronaries. Discharged after 8 days.   She presents today for follow up of her heart failure symptoms with a chief complaint of moderate shortness of breath with little exertion. She describes this as having been present for several months. She has associated fatigue along with this. She is no longer able to work. She denies any abdominal distention, palpitations, pedal edema, chest pain or cough.   Past Medical History:  Diagnosis Date   Asthma    Diabetes (HCC)    Essential hypertension    a. 06/2021 hypotensive in the setting of PE.   HFrEF (heart failure with reduced ejection fraction) (HCC)    a. 06/2021 Echo: EF <20%, no rwma, sev red RV fxn, mod BAE, mod MR/TR.   Moderate mitral regurgitation    Moderate tricuspid regurgitation    Morbid obesity (HCC)    NICM (nonischemic cardiomyopathy) (HCC)    a. 06/2021 Echo: EF <20%; b. 06/2021 Cath: Nl cors.   NSVT (nonsustained ventricular tachycardia) (HCC)    a. 06/2021 placed on amio.   Schizoaffective disorder [facility])    Past Surgical History:  Procedure Laterality Date   LEFT HEART CATH AND CORONARY ANGIOGRAPHY N/A 07/09/2021   Procedure: LEFT HEART CATH AND CORONARY ANGIOGRAPHY;  Surgeon: Tonny Bollman, MD;   Location: [facility] INVASIVE CV LAB;  Service: Cardiovascular;  Laterality: N/A;   Family History  Problem Relation Age of Onset   Heart failure Mother    Alcohol abuse Father    Social History   Tobacco Use   Smoking status: Former    Types: Cigarettes   Smokeless tobacco: Never  Substance Use Topics   Alcohol use: Not Currently   No Known Allergies Prior to Admission medications   Medication Sig Start Date End Date Taking? Authorizing Provider  amiodarone (PACERONE) 200 MG tablet Take 1 tablet (200 mg total) by mouth daily. 07/13/21 08/12/21 Yes Lynn Ito, MD  apixaban (ELIQUIS) 5 MG TABS tablet Take 1 tablet (5 mg total) by mouth 2 (two) times daily for 23 days. Specifically: Take 10mg  twice daily (7/22 PM-7/27 PM); then start 5mg  twice daily dosing (7/28 AM). 07/18/21 08/10/21 Yes 07/20/21, MD  digoxin (LANOXIN) 0.25 MG tablet Take 1 tablet (0.25 mg total) by mouth daily. 07/13/21 08/12/21 Yes 07/15/21, MD  glipiZIDE (GLUCOTROL) 5 MG tablet Take 5 mg by mouth 2 (two) times daily. 04/17/21  Yes [provider]  haloperidol (HALDOL) 10 MG tablet Take 10 mg by mouth at bedtime. 05/15/21  Yes [provider]  losartan (COZAAR) 25 MG tablet Take 1 tablet (25 mg total) by mouth daily. 07/13/21 08/12/21 Yes 07/15/21, MD  metoprolol succinate (TOPROL-XL) 25 MG 24 hr tablet Take 0.5 tablets (12.5 mg total) by mouth daily. 07/13/21 08/12/21 Yes 07/15/21, MD  midodrine (PROAMATINE) 10 MG tablet Take 1 tablet (10 mg total) by mouth 3 (three) times daily with meals. 07/12/21 08/11/21 Yes Lynn Ito, MD  omeprazole (PRILOSEC) 20 MG capsule Take 20 mg by mouth 2 (two) times daily. 05/03/21  Yes [provider]  torsemide (DEMADEX) 20 MG tablet Take 1 tablet (20 mg total) by mouth daily. 07/13/21 08/12/21 Yes Lynn Ito, MD  VENTOLIN HFA 108 (90 Base) MCG/ACT inhaler Inhale 1-2 puffs into the lungs every 4 (four) hours as needed. 04/17/21  Yes [provider]   atorvastatin (LIPITOR) 20 MG tablet Take 1 tablet by mouth daily. Patient not taking: Reported on 07/30/2021    [provider]  DULoxetine (CYMBALTA) 60 MG capsule Take 60 mg by mouth daily. Patient not taking: Reported on 07/30/2021 05/21/21   [provider]  ivabradine (CORLANOR) 5 MG TABS tablet Take 1 tablet (5 mg total) by mouth 2 (two) times daily with a meal. 07/12/21 08/11/21  Lynn Ito, MD  K-PHOS 500 MG tablet Take 500 mg by mouth 2 (two) times daily. Patient not taking: Reported on 07/30/2021 06/25/21   [provider]    Review of Systems  Constitutional:  Positive for fatigue (easily). Negative for appetite change.  HENT:  Negative for congestion, postnasal drip and sore throat.   Eyes: Negative.   Respiratory:  Positive for shortness of breath (easily). Negative for cough and chest tightness.   Cardiovascular:  Negative for chest pain, palpitations and leg swelling.  Gastrointestinal:  Negative for abdominal distention and abdominal pain.  Endocrine: Negative.   Genitourinary:  Positive for urgency (recently tx for UTI) and vaginal discharge (using monistat OTC and diflucan).  Musculoskeletal:  Negative for back pain and neck pain.  Skin: Negative.   Allergic/Immunologic: Negative.   Neurological:  Negative for dizziness, syncope and light-headedness.  Hematological:  Negative for adenopathy. Does not bruise/bleed easily.  Psychiatric/Behavioral:  Positive for sleep disturbance (sleeps on 2 pillows). Negative for dysphoric mood. The patient is not nervous/anxious.    Vitals:   09/16/21 1119  BP: 108/86  Pulse: 77  Resp: 20  SpO2: 100%  Weight: 192 lb 6 oz (87.3 kg)  Height: 5\' 4"  (1.626 m)   Wt Readings from Last 3 Encounters:  09/16/21 192 lb 6 oz (87.3 kg)  08/08/21 193 lb 12.8 oz (87.9 kg)  07/30/21 187 lb 8 oz (85 kg)   Lab Results  Component Value Date   CREATININE 0.83 08/08/2021   CREATININE 0.85 08/07/2021   CREATININE 0.99  08/05/2021    Physical Exam Vitals and nursing note reviewed. Exam conducted with a chaperone present (daughter).  Constitutional:      Appearance: Normal appearance.  HENT:     Head: Normocephalic and atraumatic.  Cardiovascular:     Rate and Rhythm: Normal rate and regular rhythm.     Heart sounds: Normal heart sounds. No murmur heard.   No gallop.  Pulmonary:     Effort: Pulmonary effort is normal. No respiratory distress.     Breath sounds: No wheezing or rales.  Abdominal:     General: There is no distension.     Palpations: Abdomen is soft.  Musculoskeletal:        General: No tenderness.     Cervical back: Normal range of motion and neck supple.     Right lower leg: No edema.     Left lower leg: No edema.  Skin:    General: Skin is warm and dry.  Neurological:     General: No focal deficit present.     Mental Status: She is alert and oriented to person, place, and time.  Psychiatric:        Mood and Affect: Affect is flat.        Thought Content: Thought content normal.     Assessment & Plan:  1: Chronic heart failure with reduced ejection fraction- - NYHA class III - euvolemic today - weighing daily; reminded to call for an overnight weight gain of > 2 pounds or a weekly weight gain of > 5 pounds - not adding salt to foods - on GDMT of losartan and metoprolol - BP remains on the low side, change torsemide to PRN to see if we can then start entresto - while changing from torsemide QD to PRN, reviewed when take torsemide in detail and provided printed instructions on when to take - sees cardiology Brion Aliment) 09/23/21 - BNP 07/06/21 was 1430.3  2: HTN- - BP looks good although on the low side (108/86) - saw PCP Sherlon Handing) 06/19/21, but needing a new PCP due to availability of current PCP - BMP 08/08/21 reviewed and showed sodium 136, potassium 4.0, creatinine 0.83 and GFR >60  3: DM- - A1c 08/05/21 was 9.9% - checked BS today at home, 115  4: Bipolar- - saw  psychiatry 05/21/21  5: Vaginal itching/dysuria - went to urgent care several weeks ago, treated for UTI with Macrobid, diflucan, and pyridium - states symptoms are somewhat better, but continues to have burning - to call Urgent Care today or return for follow up   Patient brought her medications for review. Each medication was verbally reviewed with the patient and she was encouraged to bring the bottles to every visit to confirm accuracy of list.   Return in four weeks or sooner for any questions/problems before then.

## 2021-09-16 NOTE — Patient Instructions (Signed)
Contact new PCP office to set up appt.  Return to Urgent Care, or call their office, to discuss urinary symptoms  Continue to weigh daily and record weights.   Start taking your torsemide as needed and NOT everyday. As needed means: gain of 2 lbs overnight OR 5 lbs over a week OR if you start swelling OR have increased SOB.  Return in 1 month.

## 2021-09-23 ENCOUNTER — Ambulatory Visit: Payer: Medicare Other | Admitting: Nurse Practitioner

## 2021-09-23 ENCOUNTER — Encounter: Payer: Self-pay | Admitting: Nurse Practitioner

## 2021-09-23 NOTE — Progress Notes (Deleted)
Office Visit    Patient Name: Martha Ramos Date of Encounter: 09/23/2021  Primary Care Provider:  Darnelle Spangle, MD Primary Cardiologist:  Martha Nordmann, MD  Chief Complaint    50 year old female with a history has, hypertension, schizoaffective disorder, VCD, and asthma, who presents for follow-up after hospitalization in August for dyspnea, shock, right lower lobe, HFrEF/nonischemic cardiomyopathy (EF less than 20%), and nonsustained VT requiring amiodarone.  Past Medical History    Past Medical History:  Diagnosis Date   Asthma    Diabetes (HCC)    Essential hypertension    a. 06/2021 hypotensive in the setting of PE.   HFrEF (heart failure with reduced ejection fraction) (HCC)    a. 06/2021 Echo: EF <20%, no rwma, sev red RV fxn, mod BAE, mod MR/TR.   Moderate mitral regurgitation    Moderate tricuspid regurgitation    Morbid obesity (HCC)    NICM (nonischemic cardiomyopathy) (HCC)    a. 06/2021 Echo: EF <20%; b. 06/2021 Cath: Nl cors.   NSVT (nonsustained ventricular tachycardia)    a. 06/2021 placed on amio; b. 07/2021 Zio: RSR, avg HR 60. NSVT x 6 beats, SVT x 10 beats. Isolated PACs/PVCs. Some vent bigeminy. Triggered events = Sinus rhythm.   Schizoaffective disorder Martha Ramos)    Past Surgical History:  Procedure Laterality Date   LEFT HEART CATH AND CORONARY ANGIOGRAPHY N/A 07/09/2021   Procedure: LEFT HEART CATH AND CORONARY ANGIOGRAPHY;  Surgeon: Martha Bollman, MD;  Location: [facility] INVASIVE CV LAB;  Service: Cardiovascular;  Laterality: N/A;    Allergies  No Known Allergies  History of Present Illness    50 year old female with above complex past medical history including diabetes, hypertension, schizoaffective disorder, obesity, and asthma.  In July 2022, she was admitted with acute right lower lobe PE with associated dyspnea and shock.  Echo showed EF of less than 20% with severely reduced RV function, moderate biatrial enlargement, and moderate MR/TR.   She required intensive care and intravenous milrinone therapy which was eventually weaned.  She had persistent hypotension requiring initiation of midodrine.  She also required intravenous diuresis of approximately -7 L.  Diagnostic catheterization on July 09, 2021 showed normal coronary arteries and normal LVEDP.  Prior to discharge, blood pressures improved and midodrine was discontinued.  Low-dose metoprolol and losartan were initiated, as well as digoxin and ivabradine in the setting of ongoing sinus tachycardia.  Amiodarone was also added in the setting of recurrent nonsustained VT.  Finally, she was anticoagulated with Eliquis in the setting of PE.  She required readmission on the 17th secondary to syncope which was felt to be due to underlying UTI and hyperglycemia.  QT was also prolonged on admission though it subsequently improved and amiodarone was continued.  A Zio AT was placed at discharge on August 17, and this showed predominantly sinus rhythm with an average rate of 60 bpm with 1 6 beat run of nonsustained VT (max rate 182 bpm), and 110 beat run of SVT (max rate 120 bpm).  She had occasional PACs and PVCs.  Triggered events associate with sinus rhythm.  Martha Ramos followed up in heart failure clinic on September 26, at which time she was felt to be euvolemic.  Blood pressure is soft.  Torsemide was reduced from daily dosing to as needed in hopes of creating some blood pressure.  To allow for smooth changing losartan to Entresto.  Since her last visit ***  Home Medications    Current Outpatient Medications  Medication  Sig Dispense Refill   acetaminophen (TYLENOL) 650 MG CR tablet Take 650 mg by mouth every 8 (eight) hours as needed for pain. (Patient not taking: Reported on 09/16/2021)     amiodarone (PACERONE) 200 MG tablet Take 1 tablet (200 mg total) by mouth daily. 30 tablet 0   apixaban (ELIQUIS) 5 MG TABS tablet Take 1 tablet (5 mg total) by mouth 2 (two) times daily for 23 days.  Specifically: Take 10mg  twice daily (7/22 PM-7/27 PM); then start 5mg  twice daily dosing (7/28 AM). 46 tablet 0   atorvastatin (LIPITOR) 20 MG tablet Take 1 tablet by mouth daily. (Patient not taking: No sig reported)     cephALEXin (KEFLEX) 500 MG capsule Take 500 mg by mouth 4 (four) times daily. (Patient not taking: Reported on 09/16/2021)     digoxin (LANOXIN) 0.25 MG tablet Take 1 tablet (0.25 mg total) by mouth daily. 30 tablet 0   DULoxetine (CYMBALTA) 60 MG capsule Take 60 mg by mouth daily.     glipiZIDE (GLUCOTROL) 5 MG tablet Take 5 mg by mouth 2 (two) times daily.     haloperidol (HALDOL) 10 MG tablet Take 10 mg by mouth at bedtime.     K-PHOS 500 MG tablet Take 500 mg by mouth 2 (two) times daily. (Patient not taking: No sig reported)     losartan (COZAAR) 25 MG tablet Take 1 tablet (25 mg total) by mouth daily. Skip dose if SBP <120 mmHg (Patient not taking: Reported on 09/16/2021) 30 tablet 0   metoprolol succinate (TOPROL-XL) 25 MG 24 hr tablet Take 1 tablet (25 mg total) by mouth daily. 30 tablet 5   omeprazole (PRILOSEC) 20 MG capsule Take 20 mg by mouth 2 (two) times daily.     potassium chloride SA (KLOR-CON) 20 MEQ tablet Take 20 mEq by mouth daily.     torsemide (DEMADEX) 20 MG tablet Take 1 tablet (20 mg total) by mouth daily. (Patient taking differently: Take 20 mg by mouth as needed.) 30 tablet 0   VENTOLIN HFA 108 (90 Base) MCG/ACT inhaler Inhale 1-2 puffs into the lungs every 4 (four) hours as needed.     No current facility-administered medications for this visit.     Review of Systems    ***.  All other systems reviewed and are otherwise negative except as noted above.  Physical Exam    VS:  There were no vitals taken for this visit. , BMI There is no height or weight on file to calculate BMI.     GEN: Well nourished, well developed, in no acute distress. HEENT: normal. Neck: Supple, no JVD, carotid bruits, or masses. Cardiac: RRR, no murmurs, rubs, or gallops.  No clubbing, cyanosis, edema.  Radials/DP/PT 2+ and equal bilaterally.  Respiratory:  Respirations regular and unlabored, clear to auscultation bilaterally. GI: Soft, nontender, nondistended, BS + x 4. MS: no deformity or atrophy. Skin: warm and dry, no rash. Neuro:  Strength and sensation are intact. Psych: Normal affect.  Accessory Clinical Findings    ECG personally reviewed by me today - *** - no acute changes.  Lab Results  Component Value Date   WBC 13.4 (H) 08/08/2021   HGB 13.4 08/08/2021   HCT 39.8 08/08/2021   MCV 90.2 08/08/2021   PLT 308 08/08/2021   Lab Results  Component Value Date   CREATININE 0.83 08/08/2021   BUN 11 08/08/2021   NA 136 08/08/2021   K 4.0 08/08/2021   CL 96 (L) 08/08/2021  CO2 29 08/08/2021   Lab Results  Component Value Date   ALT 36 08/08/2021   AST 28 08/08/2021   ALKPHOS 83 08/08/2021   BILITOT 0.6 08/08/2021   Lab Results  Component Value Date   CHOL 210 (H) 08/06/2021   HDL 39 (L) 08/06/2021   LDLCALC 137 (H) 08/06/2021   TRIG 172 (H) 08/06/2021   CHOLHDL 5.4 08/06/2021    Lab Results  Component Value Date   HGBA1C 9.9 (H) 08/05/2021    Assessment & Plan    1.  ***   Nicolasa Ducking, NP 09/23/2021, 11:25 AM

## 2021-10-14 ENCOUNTER — Ambulatory Visit: Payer: Medicare Other | Admitting: Family

## 2021-10-20 NOTE — Progress Notes (Signed)
Patient ID: Martha Ramos, female    DOB: 1971/07/05, 50 y.o.   MRN: [id number]  Martha Ramos is a 50 y/o female with a history of asthma, CAD, DM, HTN, bipolar and chronic heart failure.   Echo report from 07/05/21 reviewed and showed an EF of <20% along with moderate MR.   LHC done 07/09/21 showed: Angiographically normal coronary arteries Normal LVEDP Systemic hypotension  Admitted 08/05/21 due to syncope with LOC. Head CT negative. Cardiology consult obtained. Had soft BP's in the ED. Given insulin for glucose of 560. Discharged after 3 days. Admitted 07/04/21 due to chest pain and shortness of breath. Cardiology consult obtained. CTA showed pulmonary embolus. Initially given IV lasix with transition to oral diuretics. Antibiotics given for possible pneumonia. Elevated troponin thought to be due to PE. Initially placed on heparin drip and then placed on eliquis. Cath completed with normal coronaries. Discharged after 8 days.   She presents today for a follow up visit with a chief complaint of minimal fatigue upon moderate exertion. She says that this has been present for several years. She has a gradual weight gain along with this. She denies any other symptoms and specifically denies any difficulty sleeping, dizziness, abdominal distention, palpitations, pedal edema, chest pain, shortness of breath or cough.   Past Medical History:  Diagnosis Date   Asthma    Diabetes (HCC)    Essential hypertension    a. 06/2021 hypotensive in the setting of PE.   HFrEF (heart failure with reduced ejection fraction) (HCC)    a. 06/2021 Echo: EF <20%, no rwma, sev red RV fxn, mod BAE, mod MR/TR.   Moderate mitral regurgitation    Moderate tricuspid regurgitation    Morbid obesity (HCC)    NICM (nonischemic cardiomyopathy) (HCC)    a. 06/2021 Echo: EF <20%; b. 06/2021 Cath: Nl cors.   NSVT (nonsustained ventricular tachycardia)    a. 06/2021 placed on amio; b. 07/2021 Zio: RSR, avg HR 60. NSVT x 6 beats,  SVT x 10 beats. Isolated PACs/PVCs. Some vent bigeminy. Triggered events = Sinus rhythm.   Schizoaffective disorder [facility])    Past Surgical History:  Procedure Laterality Date   LEFT HEART CATH AND CORONARY ANGIOGRAPHY N/A 07/09/2021   Procedure: LEFT HEART CATH AND CORONARY ANGIOGRAPHY;  Surgeon: Tonny Bollman, MD;  Location: [facility] INVASIVE CV LAB;  Service: Cardiovascular;  Laterality: N/A;   Family History  Problem Relation Age of Onset   Heart failure Mother    Alcohol abuse Father    Social History   Tobacco Use   Smoking status: Former    Types: Cigarettes   Smokeless tobacco: Never  Substance Use Topics   Alcohol use: Not Currently   No Known Allergies  Prior to Admission medications   Medication Sig Start Date End Date Taking? Authorizing Provider  apixaban (ELIQUIS) 5 MG TABS tablet Take 1 tablet (5 mg total) by mouth 2 (two) times daily for 23 days. Specifically: Take 10mg  twice daily (7/22 PM-7/27 PM); then start 5mg  twice daily dosing (7/28 AM). 07/18/21  Yes 06-22-2001, MD  glipiZIDE (GLUCOTROL) 5 MG tablet Take 5 mg by mouth daily before breakfast. 04/17/21  Yes [provider]  haloperidol (HALDOL) 10 MG tablet Take 10 mg by mouth at bedtime. 05/15/21  Yes [provider]  metoprolol succinate (TOPROL-XL) 25 MG 24 hr tablet Take 1 tablet (25 mg total) by mouth daily. 07/30/21  Yes 05/17/21 A, FNP  omeprazole (PRILOSEC) 20 MG capsule Take 20  mg by mouth 2 (two) times daily. 05/03/21  Yes [provider]  potassium chloride SA (KLOR-CON) 20 MEQ tablet Take 20 mEq by mouth daily.   Yes [provider]  VENTOLIN HFA 108 (90 Base) MCG/ACT inhaler Inhale 1-2 puffs into the lungs every 4 (four) hours as needed. 04/17/21  Yes [provider]  acetaminophen (TYLENOL) 650 MG CR tablet Take 650 mg by mouth every 8 (eight) hours as needed for pain. Patient not taking: No sig reported    [provider]  amiodarone (PACERONE)  200 MG tablet Take 1 tablet (200 mg total) by mouth daily. Patient not taking: Reported on 10/22/2021 07/13/21 08/12/21  Lynn Ito, MD  atorvastatin (LIPITOR) 20 MG tablet Take 1 tablet by mouth daily. Patient not taking: No sig reported    [provider]  digoxin (LANOXIN) 0.25 MG tablet Take 1 tablet (0.25 mg total) by mouth daily. Patient not taking: Reported on 10/22/2021 07/13/21 08/12/21  Lynn Ito, MD  DULoxetine (CYMBALTA) 60 MG capsule Take 60 mg by mouth daily. Patient not taking: Reported on 10/22/2021 05/21/21   [provider]  K-PHOS 500 MG tablet Take 500 mg by mouth 2 (two) times daily. Patient not taking: No sig reported 06/25/21   [provider]  losartan (COZAAR) 25 MG tablet Take 1 tablet (25 mg total) by mouth daily. Skip dose if SBP <120 mmHg Patient not taking: No sig reported 08/08/21   Gillis Santa, MD  torsemide (DEMADEX) 20 MG tablet Take 1 tablet (20 mg total) by mouth daily. Patient not taking: Reported on 10/22/2021 07/13/21   Lynn Ito, MD   Review of Systems  Constitutional:  Positive for fatigue (improving). Negative for appetite change.  HENT:  Negative for congestion, postnasal drip and sore throat.   Eyes: Negative.   Respiratory:  Negative for cough, chest tightness and shortness of breath.   Cardiovascular:  Negative for chest pain, palpitations and leg swelling.  Gastrointestinal:  Negative for abdominal distention and abdominal pain.  Endocrine: Negative.   Genitourinary: Negative.   Musculoskeletal:  Negative for back pain and neck pain.  Skin: Negative.   Allergic/Immunologic: Negative.   Neurological:  Negative for dizziness, syncope and light-headedness.  Hematological:  Negative for adenopathy. Does not bruise/bleed easily.  Psychiatric/Behavioral:  Negative for dysphoric mood and sleep disturbance (sleeps on 1 pillow). The patient is not nervous/anxious.    Vitals:   10/22/21 1122  BP: 100/64  Pulse: 85  Resp:  18  SpO2: 100%  Weight: 207 lb 4 oz (94 kg)  Height: 5\' 4"  (1.626 m)   Wt Readings from Last 3 Encounters:  10/22/21 207 lb 4 oz (94 kg)  09/16/21 192 lb 6 oz (87.3 kg)  08/08/21 193 lb 12.8 oz (87.9 kg)   Lab Results  Component Value Date   CREATININE 0.83 08/08/2021   CREATININE 0.85 08/07/2021   CREATININE 0.99 08/05/2021   Physical Exam Vitals and nursing note reviewed.  Constitutional:      Appearance: Normal appearance.  HENT:     Head: Normocephalic and atraumatic.  Cardiovascular:     Rate and Rhythm: Normal rate and regular rhythm.     Heart sounds: Normal heart sounds. No murmur heard.   No gallop.  Pulmonary:     Effort: Pulmonary effort is normal. No respiratory distress.     Breath sounds: No wheezing or rales.  Abdominal:     General: There is no distension.     Palpations: Abdomen  is soft.  Musculoskeletal:        General: No tenderness.     Cervical back: Normal range of motion and neck supple.     Right lower leg: No edema.     Left lower leg: No edema.  Skin:    General: Skin is warm and dry.  Neurological:     General: No focal deficit present.     Mental Status: She is alert and oriented to person, place, and time.  Psychiatric:        Mood and Affect: Affect is flat.        Thought Content: Thought content normal.    Assessment & Plan:  1: Chronic heart failure with reduced ejection fraction- - NYHA class II - euvolemic today - weighing daily; reminded to call for an overnight weight gain of > 2 pounds or a weekly weight gain of > 5 pounds - weight up 14.8 pounds from last visit here 1 month ago - she says that she's been eating more because she wanted to pick up some weight - not adding salt to foods - on GDMT of metoprolol; was taking losartan at last visit but she isn't sure why she isn't taking it anymore - quite a bit of confusion about meds so will not adjust anything today especially with BP being low - NS for cardiology Brion Aliment)  09/23/21; this will be rescheduled - BNP 07/06/21 was 1430.3  2: HTN- - BP looks good (100/64) although she hasn't taken meds yet - saw PCP Sherlon Handing) 06/19/21 [facility]) - BMP 08/08/21 reviewed and showed sodium 136, potassium 4.0, creatinine 0.83 and GFR >60  3: DM- - A1c 08/05/21 was 9.9% - glucose in the office was 228  4: Bipolar- - had telemedicine visit with psychiatry 09/10/21  5: Tobacco use- - smoking 1 cigar every 2-3 days - complete cessation discussed    Medication bottles reviewed.   Return in 1 month or sooner for any questions/problems before then.

## 2021-10-22 ENCOUNTER — Encounter: Payer: Self-pay | Admitting: Family

## 2021-10-22 ENCOUNTER — Ambulatory Visit: Payer: Medicare Other | Attending: Family | Admitting: Family

## 2021-10-22 ENCOUNTER — Other Ambulatory Visit: Payer: Self-pay

## 2021-10-22 VITALS — BP 100/64 | HR 85 | Resp 18 | Ht 64.0 in | Wt 207.2 lb

## 2021-10-22 DIAGNOSIS — I110 Hypertensive heart disease with heart failure: Secondary | ICD-10-CM | POA: Insufficient documentation

## 2021-10-22 DIAGNOSIS — Z720 Tobacco use: Secondary | ICD-10-CM | POA: Diagnosis not present

## 2021-10-22 DIAGNOSIS — R5383 Other fatigue: Secondary | ICD-10-CM | POA: Insufficient documentation

## 2021-10-22 DIAGNOSIS — I2510 Atherosclerotic heart disease of native coronary artery without angina pectoris: Secondary | ICD-10-CM | POA: Diagnosis not present

## 2021-10-22 DIAGNOSIS — I472 Ventricular tachycardia, unspecified: Secondary | ICD-10-CM | POA: Insufficient documentation

## 2021-10-22 DIAGNOSIS — Z8249 Family history of ischemic heart disease and other diseases of the circulatory system: Secondary | ICD-10-CM | POA: Diagnosis not present

## 2021-10-22 DIAGNOSIS — F319 Bipolar disorder, unspecified: Secondary | ICD-10-CM | POA: Diagnosis not present

## 2021-10-22 DIAGNOSIS — Z79899 Other long term (current) drug therapy: Secondary | ICD-10-CM | POA: Diagnosis not present

## 2021-10-22 DIAGNOSIS — I5022 Chronic systolic (congestive) heart failure: Secondary | ICD-10-CM

## 2021-10-22 DIAGNOSIS — F259 Schizoaffective disorder, unspecified: Secondary | ICD-10-CM | POA: Insufficient documentation

## 2021-10-22 DIAGNOSIS — I428 Other cardiomyopathies: Secondary | ICD-10-CM | POA: Diagnosis not present

## 2021-10-22 DIAGNOSIS — J45909 Unspecified asthma, uncomplicated: Secondary | ICD-10-CM | POA: Diagnosis not present

## 2021-10-22 DIAGNOSIS — Z7901 Long term (current) use of anticoagulants: Secondary | ICD-10-CM | POA: Diagnosis not present

## 2021-10-22 DIAGNOSIS — Z9114 Patient's other noncompliance with medication regimen: Secondary | ICD-10-CM | POA: Insufficient documentation

## 2021-10-22 DIAGNOSIS — E119 Type 2 diabetes mellitus without complications: Secondary | ICD-10-CM

## 2021-10-22 DIAGNOSIS — Z7984 Long term (current) use of oral hypoglycemic drugs: Secondary | ICD-10-CM | POA: Insufficient documentation

## 2021-10-22 DIAGNOSIS — I10 Essential (primary) hypertension: Secondary | ICD-10-CM | POA: Diagnosis not present

## 2021-10-22 LAB — GLUCOSE, CAPILLARY: Glucose-Capillary: 228 mg/dL — ABNORMAL HIGH (ref 70–99)

## 2021-10-22 NOTE — Patient Instructions (Signed)
Continue weighing daily and call for an overnight weight gain of > 2 pounds or a weekly weight gain of >5 pounds. 

## 2021-11-21 ENCOUNTER — Ambulatory Visit: Payer: Medicare Other | Admitting: Family

## 2021-11-27 NOTE — Progress Notes (Signed)
Patient ID: Martha Ramos, female    DOB: Jul 28, 1971, 50 y.o.   MRN: [id number]  Martha Ramos is a 50 y/o female with a history of asthma, CAD, DM, HTN, bipolar and chronic heart failure.   Echo report from 07/05/21 reviewed and showed an EF of <20% along with moderate MR.   LHC done 07/09/21 showed: Angiographically normal coronary arteries Normal LVEDP Systemic hypotension  Admitted 08/05/21 due to syncope with LOC. Head CT negative. Cardiology consult obtained. Had soft BP's in the ED. Given insulin for glucose of 560. Discharged after 3 days. Admitted 07/04/21 due to chest pain and shortness of breath. Cardiology consult obtained. CTA showed pulmonary embolus. Initially given IV lasix with transition to oral diuretics. Antibiotics given for possible pneumonia. Elevated troponin thought to be due to PE. Initially placed on heparin drip and then placed on eliquis. Cath completed with normal coronaries. Discharged after 8 days.   She presents today for a follow up visit with a chief complaint of minimal fatigue upon moderate exertion. She says that this has been chronic in nature having been present for several years. She has associated dizziness, gradual weight gain and 1 syncopal episode along with this. She denies any abdominal distention, palpitations, pedal edema, chest pain, shortness of breath or cough.   Missed her cardiology appointment last month. Is out of numerous medications because she ran out of refills and wasn't sure if she needed to take them.   Past Medical History:  Diagnosis Date   Asthma    Diabetes (HCC)    Essential hypertension    a. 06/2021 hypotensive in the setting of PE.   HFrEF (heart failure with reduced ejection fraction) (HCC)    a. 06/2021 Echo: EF <20%, no rwma, sev red RV fxn, mod BAE, mod MR/TR.   Moderate mitral regurgitation    Moderate tricuspid regurgitation    Morbid obesity (HCC)    NICM (nonischemic cardiomyopathy) (HCC)    a. 06/2021 Echo: EF  <20%; b. 06/2021 Cath: Nl cors.   NSVT (nonsustained ventricular tachycardia)    a. 06/2021 placed on amio; b. 07/2021 Zio: RSR, avg HR 60. NSVT x 6 beats, SVT x 10 beats. Isolated PACs/PVCs. Some vent bigeminy. Triggered events = Sinus rhythm.   Schizoaffective disorder [facility])    Past Surgical History:  Procedure Laterality Date   LEFT HEART CATH AND CORONARY ANGIOGRAPHY N/A 07/09/2021   Procedure: LEFT HEART CATH AND CORONARY ANGIOGRAPHY;  Surgeon: Tonny Bollman, MD;  Location: [facility] INVASIVE CV LAB;  Service: Cardiovascular;  Laterality: N/A;   Family History  Problem Relation Age of Onset   Heart failure Mother    Alcohol abuse Father    Social History   Tobacco Use   Smoking status: Former    Types: Cigarettes, Cigars   Smokeless tobacco: Never  Substance Use Topics   Alcohol use: Not Currently   No Known Allergies  Prior to Admission medications   Medication Sig Start Date End Date Taking? Authorizing Provider  acetaminophen (TYLENOL) 650 MG CR tablet Take 650 mg by mouth every 8 (eight) hours as needed for pain.   Yes [provider]  apixaban (ELIQUIS) 5 MG TABS tablet Take 1 tablet (5 mg total) by mouth 2 (two) times daily for 23 days. Specifically: Take 10mg  twice daily (7/22 PM-7/27 PM); then start 5mg  twice daily dosing (7/28 AM). 07/18/21  Yes 06-22-2001, MD  glipiZIDE (GLUCOTROL) 5 MG tablet Take 5 mg by mouth daily before breakfast. 04/17/21  Yes [provider]  haloperidol (HALDOL) 10 MG tablet Take 10 mg by mouth at bedtime. 05/15/21  Yes [provider]  metoprolol succinate (TOPROL-XL) 25 MG 24 hr tablet Take 1 tablet (25 mg total) by mouth daily. 07/30/21  Yes Melizza Kanode, Inetta Fermo A, FNP  potassium chloride SA (KLOR-CON) 20 MEQ tablet Take 20 mEq by mouth daily.   Yes [provider]  VENTOLIN HFA 108 (90 Base) MCG/ACT inhaler Inhale 1-2 puffs into the lungs every 4 (four) hours as needed. 04/17/21  Yes [provider]  amiodarone  (PACERONE) 200 MG tablet Take 1 tablet (200 mg total) by mouth daily. Patient not taking: Reported on 10/22/2021 07/13/21 08/12/21  Lynn Ito, MD  atorvastatin (LIPITOR) 20 MG tablet Take 1 tablet by mouth daily. Patient not taking: Reported on 08/05/2021    [provider]  digoxin (LANOXIN) 0.25 MG tablet Take 1 tablet (0.25 mg total) by mouth daily. Patient not taking: Reported on 10/22/2021 07/13/21 08/12/21  Lynn Ito, MD  DULoxetine (CYMBALTA) 60 MG capsule Take 60 mg by mouth daily. Patient not taking: Reported on 10/22/2021 05/21/21   [provider]  K-PHOS 500 MG tablet Take 500 mg by mouth 2 (two) times daily. Patient not taking: Reported on 08/05/2021 06/25/21   [provider]  losartan (COZAAR) 25 MG tablet Take 1 tablet (25 mg total) by mouth daily. Skip dose if SBP <120 mmHg Patient not taking: Reported on 09/16/2021 08/08/21   Gillis Santa, MD  omeprazole (PRILOSEC) 20 MG capsule Take 20 mg by mouth 2 (two) times daily. Patient not taking: Reported on 11/28/2021 05/03/21   [provider]  torsemide (DEMADEX) 20 MG tablet Take 1 tablet (20 mg total) by mouth daily. Patient not taking: Reported on 10/22/2021 07/13/21   Lynn Ito, MD   Review of Systems  Constitutional:  Positive for fatigue (minimal). Negative for appetite change.  HENT:  Negative for congestion, postnasal drip and sore throat.   Eyes: Negative.   Respiratory:  Negative for cough, chest tightness and shortness of breath.   Cardiovascular:  Negative for chest pain, palpitations and leg swelling.  Gastrointestinal:  Negative for abdominal distention and abdominal pain.  Endocrine: Negative.   Genitourinary: Negative.   Musculoskeletal:  Negative for back pain and neck pain.  Skin: Negative.   Allergic/Immunologic: Negative.   Neurological:  Positive for dizziness (last week with brief LOC). Negative for syncope and light-headedness.  Hematological:  Negative for adenopathy. Does  not bruise/bleed easily.  Psychiatric/Behavioral:  Negative for dysphoric mood and sleep disturbance (sleeps on 1 pillow). The patient is not nervous/anxious.    Vitals:   11/28/21 1143  BP: (!) 132/58  Pulse: 73  Resp: 20  SpO2: 100%  Weight: 221 lb 6 oz (100.4 kg)  Height: 5\' 4"  (1.626 m)   Wt Readings from Last 3 Encounters:  11/28/21 221 lb 6 oz (100.4 kg)  10/22/21 207 lb 4 oz (94 kg)  09/16/21 192 lb 6 oz (87.3 kg)   Lab Results  Component Value Date   CREATININE 0.83 08/08/2021   CREATININE 0.85 08/07/2021   CREATININE 0.99 08/05/2021   Physical Exam Vitals and nursing note reviewed.  Constitutional:      Appearance: Normal appearance.  HENT:     Head: Normocephalic and atraumatic.  Cardiovascular:     Rate and Rhythm: Normal rate and regular rhythm.     Heart sounds: Normal heart sounds. No murmur heard.   No gallop.  Pulmonary:  Effort: Pulmonary effort is normal. No respiratory distress.     Breath sounds: No wheezing or rales.  Abdominal:     General: There is no distension.     Palpations: Abdomen is soft.  Musculoskeletal:        General: No tenderness.     Cervical back: Normal range of motion and neck supple.     Right lower leg: No edema.     Left lower leg: No edema.  Skin:    General: Skin is warm and dry.  Neurological:     General: No focal deficit present.     Mental Status: She is alert and oriented to person, place, and time.  Psychiatric:        Mood and Affect: Affect is flat.        Thought Content: Thought content normal.    Assessment & Plan:  1: Chronic heart failure with reduced ejection fraction- - NYHA class II - euvolemic today - not weighing daily but does have new batteries to put in her scales; reminded to call for an overnight weight gain of > 2 pounds or a weekly weight gain of > 5 pounds - weight up 14 pounds from last visit here 5 weeks ago - she says that she continues to eat more than she should - not adding  salt to foods - on GDMT of metoprolol - will add entresto 242/6mg  BID; 30 day voucher provided; patient confirms that she will have no problem remembering to take it twice daily - will check labs next visit - NS for cardiology Brion Aliment) 09/23/21; this was r/s for 12/30/21 and explained that she MUST keep this appointment as she has either cancelled or NS for numerous appointments with them - BNP 07/06/21 was 1430.3  2: HTN- - BP looks good (132/58) - saw PCP Sherlon Handing) 06/19/21 ([facility]) - BMP 08/08/21 reviewed and showed sodium 136, potassium 4.0, creatinine 0.83 and GFR >60  3: DM- - A1c 08/05/21 was 9.9% - glucose at home today was 176  4: Bipolar- - had telemedicine visit with psychiatry 09/10/21  5: Tobacco use- - smoking 1 cigar every 2-3 days - complete cessation discussed   6: Syncope- - she says that prior to this episode (1 week ago) she was feeling a little dizzy and had just finished smoking a cigar - felt herself fall and feels like she had LOC but can't be for sure - must f/u with cardiology   Medication bottles reviewed.   Return here in 1 month on the same day she sees cardiology so hopefully she won't miss this one.

## 2021-11-28 ENCOUNTER — Other Ambulatory Visit: Payer: Self-pay

## 2021-11-28 ENCOUNTER — Encounter: Payer: Self-pay | Admitting: Family

## 2021-11-28 ENCOUNTER — Ambulatory Visit: Payer: Medicare Other | Attending: Family | Admitting: Family

## 2021-11-28 VITALS — BP 132/58 | HR 73 | Resp 20 | Ht 64.0 in | Wt 221.4 lb

## 2021-11-28 DIAGNOSIS — I10 Essential (primary) hypertension: Secondary | ICD-10-CM

## 2021-11-28 DIAGNOSIS — I5022 Chronic systolic (congestive) heart failure: Secondary | ICD-10-CM | POA: Insufficient documentation

## 2021-11-28 DIAGNOSIS — F319 Bipolar disorder, unspecified: Secondary | ICD-10-CM | POA: Diagnosis not present

## 2021-11-28 DIAGNOSIS — F17290 Nicotine dependence, other tobacco product, uncomplicated: Secondary | ICD-10-CM | POA: Diagnosis not present

## 2021-11-28 DIAGNOSIS — R55 Syncope and collapse: Secondary | ICD-10-CM | POA: Insufficient documentation

## 2021-11-28 DIAGNOSIS — I110 Hypertensive heart disease with heart failure: Secondary | ICD-10-CM | POA: Diagnosis not present

## 2021-11-28 DIAGNOSIS — Z720 Tobacco use: Secondary | ICD-10-CM

## 2021-11-28 DIAGNOSIS — E119 Type 2 diabetes mellitus without complications: Secondary | ICD-10-CM | POA: Diagnosis not present

## 2021-11-28 MED ORDER — SACUBITRIL-VALSARTAN 24-26 MG PO TABS: 1.0000 | ORAL_TABLET | Freq: Two times a day (BID) | ORAL | 3 refills | Status: DC

## 2021-11-28 NOTE — Patient Instructions (Addendum)
Resume weighing daily and call for an overnight weight gain of 3 pounds or more or a weekly weight gain of more than 5 pounds.    Will start entresto as 1 tablet in the morning and 1 tablet in the evening. Take entresto coupon to the pharmacy so you get the first month free of charge   Have made an appointment with cardiology on the lower level (2 floors directly below [place]). You must keep this appointment.

## 2021-12-28 NOTE — Progress Notes (Deleted)
Patient ID: Martha Ramos, female    DOB: [birth year], 51 y.o.   MRN: [id number]  Martha Ramos is a 51 y/o female with a history of asthma, CAD, DM, HTN, bipolar and chronic heart failure.   Echo report from 07/05/21 reviewed and showed an EF of <20% along with moderate MR.   LHC done 07/09/21 showed: Angiographically normal coronary arteries Normal LVEDP Systemic hypotension  Admitted 08/05/21 due to syncope with LOC. Head CT negative. Cardiology consult obtained. Had soft BP's in the ED. Given insulin for glucose of 560. Discharged after 3 days. Admitted 07/04/21 due to chest pain and shortness of breath. Cardiology consult obtained. CTA showed pulmonary embolus. Initially given IV lasix with transition to oral diuretics. Antibiotics given for possible pneumonia. Elevated troponin thought to be due to PE. Initially placed on heparin drip and then placed on eliquis. Cath completed with normal coronaries. Discharged after 8 days.   She presents today for a follow up visit with a chief complaint of   Past Medical History:  Diagnosis Date   Asthma    Diabetes (HCC)    Essential hypertension    a. 06/2021 hypotensive in the setting of PE.   HFrEF (heart failure with reduced ejection fraction) (HCC)    a. 06/2021 Echo: EF <20%, no rwma, sev red RV fxn, mod BAE, mod MR/TR.   Moderate mitral regurgitation    Moderate tricuspid regurgitation    Morbid obesity (HCC)    NICM (nonischemic cardiomyopathy) (HCC)    a. 06/2021 Echo: EF <20%; b. 06/2021 Cath: Nl cors.   NSVT (nonsustained ventricular tachycardia)    a. 06/2021 placed on amio; b. 07/2021 Zio: RSR, avg HR 60. NSVT x 6 beats, SVT x 10 beats. Isolated PACs/PVCs. Some vent bigeminy. Triggered events = Sinus rhythm.   Schizoaffective disorder [facility])    Past Surgical History:  Procedure Laterality Date   LEFT HEART CATH AND CORONARY ANGIOGRAPHY N/A 07/09/2021   Procedure: LEFT HEART CATH AND CORONARY ANGIOGRAPHY;  Surgeon: Tonny Bollman, MD;   Location: [facility] INVASIVE CV LAB;  Service: Cardiovascular;  Laterality: N/A;   Family History  Problem Relation Age of Onset   Heart failure Mother    Alcohol abuse Father    Social History   Tobacco Use   Smoking status: Former    Types: Cigarettes, Cigars   Smokeless tobacco: Never  Substance Use Topics   Alcohol use: Not Currently   No Known Allergies   Review of Systems  Constitutional:  Positive for fatigue (minimal). Negative for appetite change.  HENT:  Negative for congestion, postnasal drip and sore throat.   Eyes: Negative.   Respiratory:  Negative for cough, chest tightness and shortness of breath.   Cardiovascular:  Negative for chest pain, palpitations and leg swelling.  Gastrointestinal:  Negative for abdominal distention and abdominal pain.  Endocrine: Negative.   Genitourinary: Negative.   Musculoskeletal:  Negative for back pain and neck pain.  Skin: Negative.   Allergic/Immunologic: Negative.   Neurological:  Positive for dizziness (last week with brief LOC). Negative for syncope and light-headedness.  Hematological:  Negative for adenopathy. Does not bruise/bleed easily.  Psychiatric/Behavioral:  Negative for dysphoric mood and sleep disturbance (sleeps on 1 pillow). The patient is not nervous/anxious.      Physical Exam Vitals and nursing note reviewed.  Constitutional:      Appearance: Normal appearance.  HENT:     Head: Normocephalic and atraumatic.  Cardiovascular:     Rate and Rhythm: Normal  rate and regular rhythm.     Heart sounds: Normal heart sounds. No murmur heard.   No gallop.  Pulmonary:     Effort: Pulmonary effort is normal. No respiratory distress.     Breath sounds: No wheezing or rales.  Abdominal:     General: There is no distension.     Palpations: Abdomen is soft.  Musculoskeletal:        General: No tenderness.     Cervical back: Normal range of motion and neck supple.     Right lower leg: No edema.     Left lower leg:  No edema.  Skin:    General: Skin is warm and dry.  Neurological:     General: No focal deficit present.     Mental Status: She is alert and oriented to person, place, and time.  Psychiatric:        Mood and Affect: Affect is flat.        Thought Content: Thought content normal.    Assessment & Plan:  1: Chronic heart failure with reduced ejection fraction- - NYHA class II - euvolemic today - not weighing daily but does have new batteries to put in her scales; reminded to call for an overnight weight gain of > 2 pounds or a weekly weight gain of > 5 pounds - weight 221.6 pounds from last visit here 1 month ago - she says that she continues to eat more than she should - not adding salt to foods - on GDMT of metoprolol & entresto - will check labs today since entresto started at last visit - sees cardiology Mariah Milling) later today - BNP 07/06/21 was 1430.3  2: HTN- - BP  - saw PCP Sherlon Handing) 06/19/21 ([facility]) - BMP 08/08/21 reviewed and showed sodium 136, potassium 4.0, creatinine 0.83 and GFR >60  3: DM- - A1c 08/05/21 was 9.9% - glucose at home today was   4: Bipolar- - had telemedicine visit with psychiatry 09/10/21  5: Tobacco use- - smoking 1 cigar every 2-3 days - complete cessation discussed    Medication bottles reviewed.

## 2021-12-29 NOTE — Progress Notes (Signed)
Cardiology Office Note  Date:  12/30/2021   ID:  Martha Ramos, DOB [birth year], MRN [id number]  PCP:  Darnelle Spangle, MD   Chief Complaint  Patient presents with   Follow-up    "Doing well." Medications reviewed by the patient's bottles.     HPI:  Martha Ramos is a 51 y.o. female with a hx of  diabetes,  hypertension,  coronary artery disease,  bipolar disorder,  asthma  admitted 06/2021 with acute pulmonary embolism and found to have biventricular heart failure.  Who presents to establish care in the [facility] office for her cardiomyopathy  Since discharge from hospital July 2022, has been followed by CHF clinic  In follow-up today denies any lower extremity edema, no shortness of breath on exertion Would appear several her medications are no longer on her list including digoxin, torsemide  She is on metoprolol succinate with Entresto, Eliquis  EKG personally reviewed by myself on todays visit Normal sinus rhythm rate 73 bpm LVH T wave abnormality 1 and aVL  Hospital records reviewed from July 2022, Diagnosed with Acute pulmonary embolism: RV failure:  Sedentary lifestyle felt a contributor to 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   Prior echo 03/2020 revealed normal systolic function.   Cardiac catheterization July 09, 2021 normal coronary arteries Unable to exclude stress cardiomyopathy digoxin, Corlanor, torsemide 20 daily while she was in the hospital, she is no longer on these  For her bipolar disorder, she is on Haldol  Echocardiogram July  2022 severely reduced LV function, RV on four-chamber axial images moderately depressed RV function, appears moderately dilated   PMH:   has a past medical history of Asthma, Diabetes (HCC), Essential hypertension, HFrEF (heart failure with reduced  ejection fraction) (HCC), Moderate mitral regurgitation, Moderate tricuspid regurgitation, Morbid obesity (HCC), NICM (nonischemic cardiomyopathy) (HCC), NSVT (nonsustained ventricular tachycardia), and Schizoaffective disorder (HCC).  PSH:    Past Surgical History:  Procedure Laterality Date   LEFT HEART CATH AND CORONARY ANGIOGRAPHY N/A 07/09/2021   Procedure: LEFT HEART CATH AND CORONARY ANGIOGRAPHY;  Surgeon: Tonny Bollman, MD;  Location: [facility] INVASIVE CV LAB;  Service: Cardiovascular;  Laterality: N/A;    Current Outpatient Medications  Medication Sig Dispense Refill   acetaminophen (TYLENOL) 650 MG CR tablet Take 650 mg by mouth every 8 (eight) hours as needed for pain.     apixaban (ELIQUIS) 5 MG TABS tablet Take 1 tablet (5 mg total) by mouth 2 (two) times daily for 23 days. Specifically: Take 10mg  twice daily (7/22 PM-7/27 PM); then start 5mg  twice daily dosing (7/28 AM). 46 tablet 0   glipiZIDE (GLUCOTROL) 5 MG tablet Take 5 mg by mouth daily before breakfast.     haloperidol (HALDOL) 10 MG tablet Take 10 mg by mouth at bedtime.     potassium chloride SA (KLOR-CON) 20 MEQ tablet Take 20 mEq by mouth daily.     sacubitril-valsartan (ENTRESTO) 24-26 MG Take 1 tablet by mouth 2 (two) times daily. 60 tablet 3   VENTOLIN HFA 108 (90 Base) MCG/ACT inhaler Inhale 1-2 puffs into the lungs every 4 (four) hours as needed.     amiodarone (PACERONE) 200 MG tablet Take 1 tablet (200 mg total) by mouth daily. 90 tablet 3   atorvastatin (LIPITOR) 20 MG tablet Take 1 tablet (20 mg total) by mouth daily. 90 tablet  3   digoxin (LANOXIN) 0.25 MG tablet Take 1 tablet (0.25 mg total) by mouth daily. 90 tablet 3   DULoxetine (CYMBALTA) 60 MG capsule Take 60 mg by mouth daily. (Patient not taking: Reported on 10/22/2021)     K-PHOS 500 MG tablet Take 500 mg by mouth 2 (two) times daily. (Patient not taking: Reported on 08/05/2021)     metoprolol succinate (TOPROL-XL) 25 MG 24 hr tablet Take 1 tablet (25 mg  total) by mouth daily. 90 tablet 3   omeprazole (PRILOSEC) 20 MG capsule Take 20 mg by mouth 2 (two) times daily. (Patient not taking: Reported on 12/30/2021)     torsemide (DEMADEX) 20 MG tablet Take 1 tablet (20 mg total) by mouth daily. 90 tablet 3   No current facility-administered medications for this visit.    Allergies:   Patient has no known allergies.   Social History:  The patient  reports that she has quit smoking. Her smoking use included cigarettes and cigars. She has never used smokeless tobacco. She reports that she does not currently use alcohol. She reports that she does not currently use drugs.   Family History:   family history includes Alcohol abuse in her father; Heart failure in her mother.    Review of Systems: Review of Systems  Constitutional: Negative.   HENT: Negative.    Respiratory: Negative.    Cardiovascular: Negative.   Gastrointestinal: Negative.   Musculoskeletal: Negative.   Neurological: Negative.   Psychiatric/Behavioral: Negative.    All other systems reviewed and are negative.   PHYSICAL EXAM: VS:  BP 100/60 (BP Location: Left Arm, Patient Position: Sitting, Cuff Size: Large)    Pulse 73    Ht 5\' 4"  (1.626 m)    Wt 218 lb 2 oz (98.9 kg)    SpO2 98%    BMI 37.44 kg/m  , BMI Body mass index is 37.44 kg/m. GEN: Well nourished, well developed, in no acute distress HEENT: normal Neck: no JVD, carotid bruits, or masses Cardiac: RRR; no murmurs, rubs, or gallops,no edema  Respiratory:  clear to auscultation bilaterally, normal work of breathing GI: soft, nontender, nondistended, + BS MS: no deformity or atrophy Skin: warm and dry, no rash Neuro:  Strength and sensation are intact Psych: euthymic mood, full affect   Recent Labs: 07/06/2021: B Natriuretic Peptide 1,430.3 08/06/2021: TSH 0.846 08/08/2021: ALT 36; BUN 11; Creatinine, Ser 0.83; Hemoglobin 13.4; Magnesium 2.0; Platelets 308; Potassium 4.0; Sodium 136    Lipid Panel Lab Results   Component Value Date   CHOL 210 (H) 08/06/2021   HDL 39 (L) 08/06/2021   LDLCALC 137 (H) 08/06/2021   TRIG 172 (H) 08/06/2021      Wt Readings from Last 3 Encounters:  12/30/21 218 lb 2 oz (98.9 kg)  11/28/21 221 lb 6 oz (100.4 kg)  10/22/21 207 lb 4 oz (94 kg)     ASSESSMENT AND PLAN:  Problem List Items Addressed This Visit       Cardiology Problems   Chronic systolic CHF (congestive heart failure) (HCC)   Relevant Medications   amiodarone (PACERONE) 200 MG tablet   atorvastatin (LIPITOR) 20 MG tablet   digoxin (LANOXIN) 0.25 MG tablet   metoprolol succinate (TOPROL-XL) 25 MG 24 hr tablet   torsemide (DEMADEX) 20 MG tablet   Acute pulmonary embolism (HCC)   Relevant Medications   amiodarone (PACERONE) 200 MG tablet   atorvastatin (LIPITOR) 20 MG tablet   digoxin (LANOXIN) 0.25 MG tablet  metoprolol succinate (TOPROL-XL) 25 MG 24 hr tablet   torsemide (DEMADEX) 20 MG tablet   Other Visit Diagnoses     Nonischemic cardiomyopathy (HCC)    -  Primary   Relevant Medications   amiodarone (PACERONE) 200 MG tablet   atorvastatin (LIPITOR) 20 MG tablet   digoxin (LANOXIN) 0.25 MG tablet   metoprolol succinate (TOPROL-XL) 25 MG 24 hr tablet   torsemide (DEMADEX) 20 MG tablet   Other Relevant Orders   EKG 12-Lead   ECHOCARDIOGRAM COMPLETE      Cardiomyopathy On presentation today she is not taking amiodarone, digoxin, torsemide, Corlanor Will continue to hold these, Repeat echocardiogram has been ordered If ejection fraction has normalized, these may not be needed Appears euvolemic,  Continue metoprolol succinate and Entresto, BP low , unable to titrate upwards  Nonsustained VT Noted in the hospital, started on amiodarone Currently not taking amiodarone on today's visit Will continue metoprolol for now, echo as above  Chronic systolic CHF Appears relatively euvolemic, Reports has been quite sometime since she has been on her torsemide     Total encounter  time more than 25 minutes  Greater than 50% was spent in counseling and coordination of care with the patient     Signed, Dossie Arbour, M.D., Ph.D. [facility] [place], [place] [phone]

## 2021-12-30 ENCOUNTER — Ambulatory Visit (INDEPENDENT_AMBULATORY_CARE_PROVIDER_SITE_OTHER): Payer: Medicare Other | Admitting: Cardiovascular Disease

## 2021-12-30 ENCOUNTER — Telehealth: Payer: Self-pay | Admitting: Family

## 2021-12-30 ENCOUNTER — Encounter: Payer: Self-pay | Admitting: Cardiovascular Disease

## 2021-12-30 ENCOUNTER — Other Ambulatory Visit: Payer: Self-pay

## 2021-12-30 ENCOUNTER — Ambulatory Visit: Payer: Medicare Other | Admitting: Family

## 2021-12-30 VITALS — BP 100/60 | HR 73 | Ht 64.0 in | Wt 218.1 lb

## 2021-12-30 DIAGNOSIS — I428 Other cardiomyopathies: Secondary | ICD-10-CM

## 2021-12-30 DIAGNOSIS — I2609 Other pulmonary embolism with acute cor pulmonale: Secondary | ICD-10-CM | POA: Diagnosis not present

## 2021-12-30 DIAGNOSIS — I5022 Chronic systolic (congestive) heart failure: Secondary | ICD-10-CM

## 2021-12-30 MED ORDER — METOPROLOL SUCCINATE ER 25 MG PO TB24: 25.0000 mg | ORAL_TABLET | Freq: Every day | ORAL | 3 refills | Status: AC

## 2021-12-30 MED ORDER — DIGOXIN 250 MCG PO TABS: 0.2500 mg | ORAL_TABLET | Freq: Every day | ORAL | 0 refills | Status: DC

## 2021-12-30 MED ORDER — AMIODARONE HCL 200 MG PO TABS: 200.0000 mg | ORAL_TABLET | Freq: Every day | ORAL | 0 refills | Status: DC

## 2021-12-30 MED ORDER — TORSEMIDE 20 MG PO TABS: 20.0000 mg | ORAL_TABLET | Freq: Every day | ORAL | 0 refills | Status: DC

## 2021-12-30 MED ORDER — DIGOXIN 250 MCG PO TABS: 0.2500 mg | ORAL_TABLET | Freq: Every day | ORAL | 3 refills | Status: DC

## 2021-12-30 MED ORDER — TORSEMIDE 20 MG PO TABS: 20.0000 mg | ORAL_TABLET | Freq: Every day | ORAL | 3 refills | Status: DC

## 2021-12-30 MED ORDER — ATORVASTATIN CALCIUM 20 MG PO TABS
20.0000 mg | ORAL_TABLET | Freq: Every day | ORAL | 3 refills | Status: AC
Start: 2021-12-30 — End: ?

## 2021-12-30 MED ORDER — AMIODARONE HCL 200 MG PO TABS: 200.0000 mg | ORAL_TABLET | Freq: Every day | ORAL | 3 refills | Status: DC

## 2021-12-30 NOTE — Patient Instructions (Addendum)
Medication Instructions:  No changes   If you need a refill on your cardiac medications before your next appointment, please call your pharmacy.   Lab work: No new labs needed  Testing/Procedures: Your physician has requested that you have an echocardiogram. Echocardiography is a painless test that uses sound waves to create images of your heart. It provides your doctor with information about the size and shape of your heart and how well your hearts chambers and valves are working. This procedure takes approximately one hour. There are no restrictions for this procedure.  There is a possibility that an IV may need to be started during your test to inject an image enhancing agent. This is done to obtain more optimal pictures of your heart. Therefore we ask that you do at least drink some water prior to coming in to hydrate your veins.    Follow-Up: At [facility], you and your health needs are our priority.  As part of our continuing mission to provide you with exceptional heart care, we have created designated Provider Care Teams.  These Care Teams include your primary Cardiologist (physician) and Advanced Practice Providers (APPs -  Physician Assistants and Nurse Practitioners) who all work together to provide you with the care you need, when you need it.  You will need a follow up appointment in 6 months  Providers on your designated Care Team:   Nicolasa Ducking, NP Eula Listen, PA-C Cadence Fransico Michael, [place]  COVID-19 Vaccine Information can be found at: [email or web address] For questions related to vaccine distribution or appointments, please email [email or web address] or call [phone].

## 2021-12-30 NOTE — Telephone Encounter (Signed)
Patient did not show for her Heart Failure Clinic appointment on 12/30/21. Will attempt to reschedule.

## 2021-12-31 ENCOUNTER — Ambulatory Visit (HOSPITAL_BASED_OUTPATIENT_CLINIC_OR_DEPARTMENT_OTHER): Payer: Medicare Other | Admitting: Family

## 2021-12-31 ENCOUNTER — Telehealth: Payer: Self-pay | Admitting: Cardiovascular Disease

## 2021-12-31 ENCOUNTER — Encounter: Payer: Self-pay | Admitting: Family

## 2021-12-31 ENCOUNTER — Other Ambulatory Visit
Admission: RE | Admit: 2021-12-31 | Discharge: 2021-12-31 | Disposition: A | Discharge status: Home / Self Care | Payer: Medicare Other | Source: Ambulatory Visit | Attending: Family | Admitting: Family

## 2021-12-31 ENCOUNTER — Telehealth: Payer: Self-pay

## 2021-12-31 VITALS — BP 131/76 | HR 82 | Resp 20 | Ht 64.0 in | Wt 223.0 lb

## 2021-12-31 DIAGNOSIS — I340 Nonrheumatic mitral (valve) insufficiency: Secondary | ICD-10-CM | POA: Insufficient documentation

## 2021-12-31 DIAGNOSIS — I110 Hypertensive heart disease with heart failure: Secondary | ICD-10-CM | POA: Insufficient documentation

## 2021-12-31 DIAGNOSIS — I2510 Atherosclerotic heart disease of native coronary artery without angina pectoris: Secondary | ICD-10-CM | POA: Insufficient documentation

## 2021-12-31 DIAGNOSIS — Z79899 Other long term (current) drug therapy: Secondary | ICD-10-CM | POA: Insufficient documentation

## 2021-12-31 DIAGNOSIS — F17290 Nicotine dependence, other tobacco product, uncomplicated: Secondary | ICD-10-CM | POA: Insufficient documentation

## 2021-12-31 DIAGNOSIS — I428 Other cardiomyopathies: Secondary | ICD-10-CM | POA: Diagnosis not present

## 2021-12-31 DIAGNOSIS — I509 Heart failure, unspecified: Secondary | ICD-10-CM | POA: Diagnosis not present

## 2021-12-31 DIAGNOSIS — F319 Bipolar disorder, unspecified: Secondary | ICD-10-CM | POA: Insufficient documentation

## 2021-12-31 DIAGNOSIS — J45909 Unspecified asthma, uncomplicated: Secondary | ICD-10-CM | POA: Insufficient documentation

## 2021-12-31 DIAGNOSIS — E119 Type 2 diabetes mellitus without complications: Secondary | ICD-10-CM | POA: Insufficient documentation

## 2021-12-31 DIAGNOSIS — I5022 Chronic systolic (congestive) heart failure: Secondary | ICD-10-CM | POA: Insufficient documentation

## 2021-12-31 DIAGNOSIS — Z720 Tobacco use: Secondary | ICD-10-CM

## 2021-12-31 DIAGNOSIS — I10 Essential (primary) hypertension: Secondary | ICD-10-CM

## 2021-12-31 LAB — BASIC METABOLIC PANEL
Anion gap: 8 (ref 5–15)
BUN: 21 mg/dL — ABNORMAL HIGH (ref 6–20)
CO2: 24 mmol/L (ref 22–32)
Calcium: 9 mg/dL (ref 8.9–10.3)
Chloride: 103 mmol/L (ref 98–111)
Creatinine, Ser: 1.06 mg/dL — ABNORMAL HIGH (ref 0.44–1.00)
GFR, Estimated: >60 mL/min (ref >60)
Glucose, Bld: 358 mg/dL — ABNORMAL HIGH (ref 70–99)
Potassium: 4.1 mmol/L (ref 3.5–5.1)
Sodium: 135 mmol/L (ref 135–145)

## 2021-12-31 NOTE — Progress Notes (Signed)
Patient ID: Martha Ramos, female    DOB: 1971/06/17, 51 y.o.   MRN: [id number]  Ms Martha Ramos is a 51 y/o female with a history of asthma, CAD, DM, HTN, bipolar and chronic heart failure.   Echo report from 07/05/21 reviewed and showed an EF of <20% along with moderate MR.   LHC done 07/09/21 showed: Angiographically normal coronary arteries Normal LVEDP Systemic hypotension  Admitted 08/05/21 due to syncope with LOC. Head CT negative. Cardiology consult obtained. Had soft BP's in the ED. Given insulin for glucose of 560. Discharged after 3 days. Admitted 07/04/21 due to chest pain and shortness of breath. Cardiology consult obtained. CTA showed pulmonary embolus. Initially given IV lasix with transition to oral diuretics. Antibiotics given for possible pneumonia. Elevated troponin thought to be due to PE. Initially placed on heparin drip and then placed on eliquis. Cath completed with normal coronaries. Discharged after 8 days.   She presents today for a follow up visit with a chief complaint of minimal fatigue upon moderate exertion. She describes this as chronic in nature. She has no other symptoms and specifically denies any difficulty sleeping, dizziness, abdominal distention, palpitations, pedal edema, chest pain, shortness of breath, cough or weight gain.   Saw cardiology yesterday and has upcoming echo on 01/24/22.   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   Past Medical History:  Diagnosis Date   Asthma    Diabetes ([place])    Essential hypertension    a. 06/2021 hypotensive in the setting of PE.   HFrEF (heart failure with reduced ejection fraction) (Martha Ramos)    a. 06/2021 Echo: EF <20%, no rwma, sev red RV fxn, mod BAE, mod MR/TR.   Moderate mitral regurgitation    Moderate tricuspid regurgitation    Morbid obesity (HCC)    NICM (nonischemic cardiomyopathy) (Martha Ramos)    a. 06/2021 Echo: EF <20%; b. 06/2021 Cath:  Nl cors.   NSVT (nonsustained ventricular tachycardia)    a. 06/2021 placed on amio; b. 07/2021 Zio: RSR, avg HR 60. NSVT x 6 beats, SVT x 10 beats. Isolated PACs/PVCs. Some vent bigeminy. Triggered events = Sinus rhythm.   Schizoaffective disorder [facility])    Past Surgical History:  Procedure Laterality Date   LEFT HEART CATH AND CORONARY ANGIOGRAPHY N/A 07/09/2021   Procedure: LEFT HEART CATH AND CORONARY ANGIOGRAPHY;  Surgeon: Sherren Mocha, MD;  Location: [place] CV LAB;  Service: Cardiovascular;  Laterality: N/A;   Family History  Problem Relation Age of Onset   Heart failure Mother    Alcohol abuse Father    Social History   Tobacco Use   Smoking status: Former    Types: Cigarettes, Cigars   Smokeless tobacco: Never  Substance Use Topics   Alcohol use: Not Currently   No Known Allergies  Prior to Admission medications   Medication Sig Start Date End Date Taking? Authorizing Provider  acetaminophen (TYLENOL) 650 MG CR tablet Take 650 mg by mouth every 8 (eight) hours as needed for pain.   Yes [provider]  apixaban (ELIQUIS) 5 MG TABS tablet Take 1 tablet (5 mg total) by mouth 2 (two) times daily for 23 days. Specifically: Take 10mg  twice daily (7/22 PM-7/27 PM); then start 5mg  twice daily dosing (7/28 AM). 07/18/21  Yes Nolberto Hanlon, MD  atorvastatin (LIPITOR) 20 MG tablet Take 1 tablet (20 mg total) by mouth daily. 12/30/21  Yes Minna Merritts, MD  glipiZIDE (GLUCOTROL) 5 MG tablet Take 5 mg by mouth daily before breakfast. 04/17/21  Yes [provider]  haloperidol (HALDOL) 10 MG tablet Take 10 mg by mouth at bedtime. 05/15/21  Yes [provider]  metoprolol succinate (TOPROL-XL) 25 MG 24 hr tablet Take 1 tablet (25 mg total) by mouth daily. 12/30/21  Yes Gollan, Kathlene November, MD  potassium chloride SA (KLOR-CON) 20 MEQ tablet Take 20 mEq by mouth daily.   Yes [provider]  sacubitril-valsartan (ENTRESTO) 24-26 MG Take 1 tablet by mouth  2 (two) times daily. 11/28/21  Yes Eliya Bubar A, FNP  VENTOLIN HFA 108 (90 Base) MCG/ACT inhaler Inhale 1-2 puffs into the lungs every 4 (four) hours as needed. 04/17/21  Yes [provider]  DULoxetine (CYMBALTA) 60 MG capsule Take 60 mg by mouth daily. Patient not taking: Reported on 10/22/2021 05/21/21   [provider]    Review of Systems  Constitutional:  Positive for fatigue (minimal). Negative for appetite change.  HENT:  Negative for congestion, postnasal drip and sore throat.   Eyes: Negative.   Respiratory:  Negative for cough, chest tightness and shortness of breath.   Cardiovascular:  Negative for chest pain, palpitations and leg swelling.  Gastrointestinal:  Negative for abdominal distention and abdominal pain.  Endocrine: Negative.   Genitourinary: Negative.   Musculoskeletal:  Negative for back pain and neck pain.  Skin: Negative.   Allergic/Immunologic: Negative.   Neurological:  Negative for dizziness, syncope and light-headedness.  Hematological:  Negative for adenopathy. Does not bruise/bleed easily.  Psychiatric/Behavioral:  Negative for dysphoric mood and sleep disturbance (sleeps on 1 pillow). The patient is not nervous/anxious.    Vitals:   12/31/21 1422  BP: 131/76  Pulse: 82  Resp: 20  SpO2: 100%  Weight: 223 lb (101.2 kg)  Height: 5\' 4"  (1.626 m)   Wt Readings from Last 3 Encounters:  12/31/21 223 lb (101.2 kg)  12/30/21 218 lb 2 oz (98.9 kg)  11/28/21 221 lb 6 oz (100.4 kg)   Lab Results  Component Value Date   CREATININE 0.83 08/08/2021   CREATININE 0.85 08/07/2021   CREATININE 0.99 08/05/2021    Physical Exam Vitals and nursing note reviewed.  Constitutional:      Appearance: Normal appearance.  HENT:     Head: Normocephalic and atraumatic.  Cardiovascular:     Rate and Rhythm: Normal rate and regular rhythm.     Heart sounds: Normal heart sounds. No murmur heard.   No gallop.  Pulmonary:     Effort: Pulmonary effort  is normal. No respiratory distress.     Breath sounds: No wheezing or rales.  Abdominal:     General: There is no distension.     Palpations: Abdomen is soft.  Musculoskeletal:        General: No tenderness.     Cervical back: Normal range of motion and neck supple.     Right lower leg: No edema.     Left lower leg: No edema.  Skin:    General: Skin is warm and dry.  Neurological:     General: No focal deficit present.     Mental Status: She is alert and oriented to person, place, and time.  Psychiatric:        Mood and Affect: Affect is flat.        Thought Content: Thought content normal.    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 - on GDMT of metoprolol & entresto - will check labs today since entresto started at last visit - saw cardiology Rockey Situ) 12/30/21; has echo scheduled for 01/24/22 - will hold off further med adjustments until after echo completed - BNP 07/06/21 was 1430.3  2: HTN- - BP mildly elevated (131/76) but was low yesterday (100/60) - saw PCP Norma Fredrickson) 06/19/21 ([facility]) - BMP 08/08/21 reviewed and showed sodium 136, potassium 4.0, creatinine 0.83 and GFR >60  3: DM- - A1c 08/05/21 was 9.9% - doesn't consistently check glucose  4: Bipolar- - had telemedicine visit with psychiatry 09/10/21  5: Tobacco use- - smoking 1 cigar every 2-3 days - complete cessation discussed    Patient did not bring her medications nor a list. Each medication was verbally reviewed with the patient and she was encouraged to bring the bottles to every visit to confirm  accuracy of list.   Return in 5 weeks, sooner for any questions/problems.

## 2021-12-31 NOTE — Telephone Encounter (Signed)
Was able to return phone call to pharmacist at [facility] to clarify pt's medications and  refills.   Advised Dr. Mariah Milling wanted following meds to be remove from pt's med list, no longer needs refills  REMOVE Amiodarone Digoxin Torsemide Corlanor  Please Refill Lipitor 20 mg daily Metoprolol succinate 25 mg daily   Pharmacist verbalized understanding and very grateful for clarification. Will call back for any future med concerns.

## 2021-12-31 NOTE — Telephone Encounter (Signed)
Kathlene November from [facility] calling to clarify all rx sent for refill instructions and doses unclear .

## 2021-12-31 NOTE — Telephone Encounter (Signed)
Incomingfax from patients pharmacy requesting a PA for Torsemide 20MG    Per Dr. note yesterday (12/30/2021)   "On presentation today she is not taking amiodarone, digoxin, torsemide, Corlanor Will continue to hold these, Repeat echocardiogram has been ordered If ejection fraction has normalized, these may not be needed"  PA will be on hold until after patient ECHO.

## 2021-12-31 NOTE — Patient Instructions (Addendum)
Resume weighing daily and call for an overnight weight gain of 3 pounds or more or a weekly weight gain of more than 5 pounds.  °

## 2022-01-01 ENCOUNTER — Telehealth: Payer: Self-pay

## 2022-01-01 NOTE — Telephone Encounter (Signed)
-----   Message from Martha Ramos, [place] sent at 12/31/2021  7:08 PM EST ----- Sodium, potassium and kidney function look good. Glucose was quite elevated at 358. She should see primary care doctor soon to re-evaluate her diabetes treatment.

## 2022-01-24 ENCOUNTER — Other Ambulatory Visit: Payer: Medicare Other

## 2022-01-29 ENCOUNTER — Telehealth: Payer: Self-pay | Admitting: Cardiovascular Disease

## 2022-01-29 NOTE — Telephone Encounter (Signed)
° °  Name: Martha Ramos  DOB: [birth year]  MRN: [id number]  Primary Cardiologist: Julien Nordmann, MD  Chart reviewed as part of pre-operative protocol coverage. Because of Kerissa Kerner's history and our conversation today, she will require a follow-up visit in order to better assess preoperative cardiovascular risk.  Patient has history of chronic combined heart failure, PE, NSVT, DM, HTN, CAD, bipolar disorder, asthma, prolonged QTc, syncope, and RV failure. She last saw Dr. Mariah Milling 12/30/21 at which time she was not taking any of her cardiac medications and her blood pressure was low. Sine that time she followed up with the [facility] for management of her CHF. Weight noted to be uptrending over the last several visits - was 192 in 08/2021, 207 in 10/2021, 221 in 11/2021, 218 then 223 this month. She was felt to be euvolemic at that visit with plan to await echocardiogram which is scheduled later this month. She did not bring her medications to that visit and did not have a list.     When I spoke with her today she could not tell me what her weight was running, how her blood pressure was running, or what medications she was taking. This makes it extremely challenging to clear her medically over the phone. Based on 2 prior visits with medication issues, I suspect a component of nonadherence to her medication regimen. She is also unable to tell me whether she is taking any blood thinner. She had been prescribed Eliquis in the past for a PE - she is not on this for cardiac reasons. When asked to clarify meds, she says "well, I have it all straight" and will not provide further details. She denies any new cardiac symptoms.   I believe she is best served being seen back in the [facility] office with APP to bring ALL her medications with her, for review and completion of clearance. Although EGD/colonoscopy are fairly low risk, I am concerned about her compliance and medication adherence  increasing her CV risk overall including risk of rehospitalization. I told her I would cancel the [facility] Heart Failure clinic visit 2/17 and have our scheduling team arrange a follow-up at the [facility] clinic instead. She agrees with this plan and knows to bring all of her meds to that visit.  Pre-op covering staff: - Please schedule appointment and call patient to inform them. - Please contact requesting surgeon's office via preferred method (i.e, phone, fax) to inform them of need for appointment prior to surgery.  Re: anticoagulation - as above, compliance with anticoagulation is in question, nor do we prescribe this for the patient so this will need to be reviewed at OV. If she is still on Eliquis, anticipate needing input from PCP although in Epic, the last fill as when she was in the hospital in 06/2021.  Laurann Montana, PA-C  01/29/2022, 3:33 PM

## 2022-01-29 NOTE — Telephone Encounter (Signed)
° °  Pre-operative Risk Assessment    Patient Name: Martha Ramos  DOB: 01-Sep-1971 MRN: [id number]      Request for Surgical Clearance    Procedure:   EGD / colonoscopy   Date of Surgery:  Clearance TBD                                 Surgeon:  not indicated  Surgeon's Group or Practice Name:  [facility] GI Phone number:  [phone] Fax number:  [phone]   Type of Clearance Requested:   - Medical    Type of Anesthesia:  Not Indicated   Additional requests/questions:    Manfred Arch   01/29/2022, 2:23 PM

## 2022-01-30 NOTE — Telephone Encounter (Signed)
Pt is scheduled with Cadence Furth in [place] on 02/19/2022 for surgical clearance.

## 2022-02-07 ENCOUNTER — Ambulatory Visit: Payer: Medicare Other | Admitting: Family

## 2022-02-17 ENCOUNTER — Other Ambulatory Visit: Payer: Medicare Other

## 2022-02-19 ENCOUNTER — Ambulatory Visit: Payer: Medicare Other | Admitting: Medical

## 2022-03-07 ENCOUNTER — Other Ambulatory Visit: Payer: Medicare Other

## 2022-03-07 IMAGING — US US EXTREM LOW VENOUS
1 series · 13 of 24 positions shown · non-contrast
Comparison: None.

CLINICAL DATA: 49-year-old female with PE. Evaluate for residual
DVT.



[Series 1: us venous img lower bilat (dvt) · portal-venous · 13 of 50 slices shown]
[im 1/50]
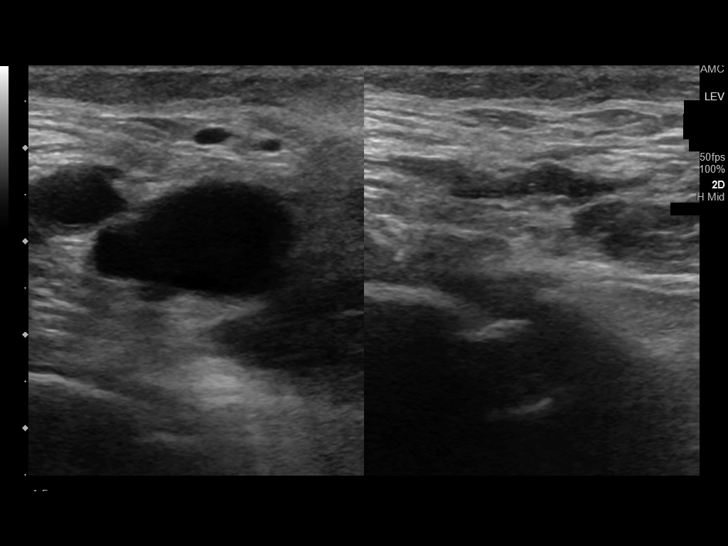
[im 5/50]
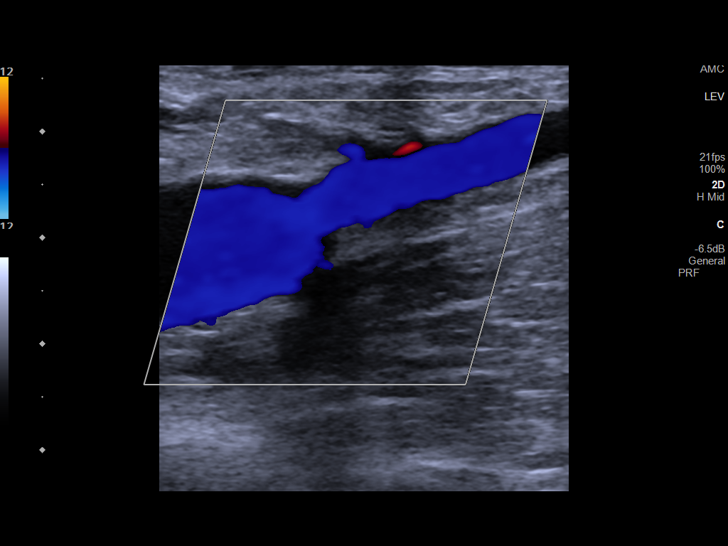
[im 9/50]
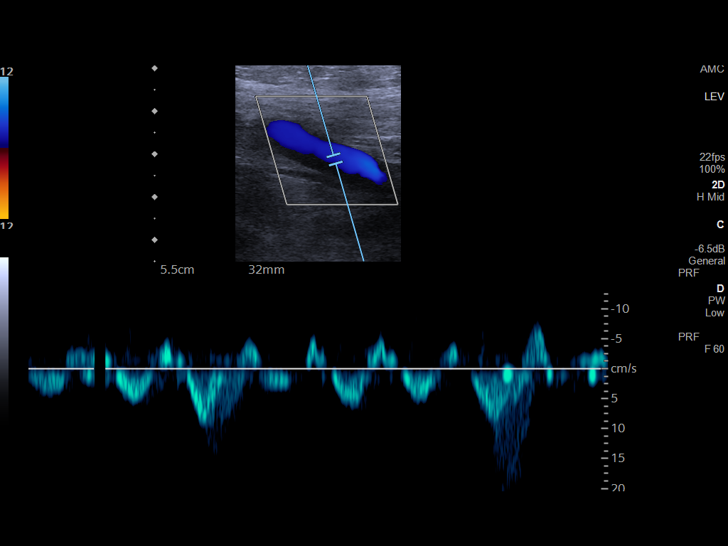
[im 13/50]
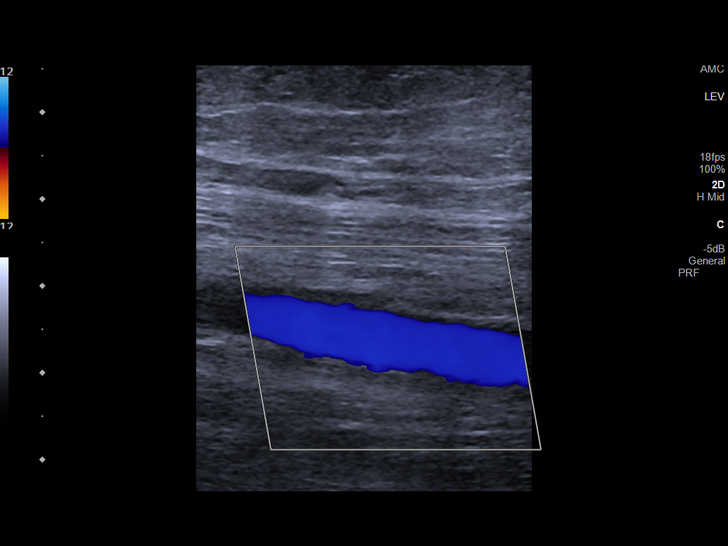
[im 18/50]
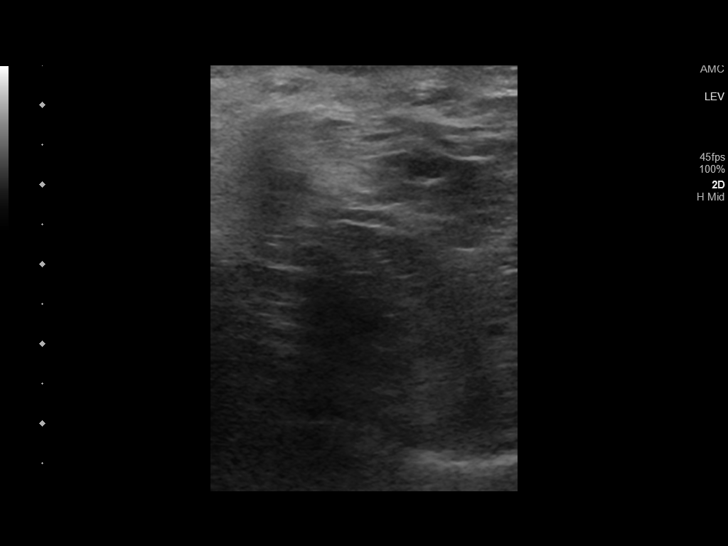
[im 22/50]
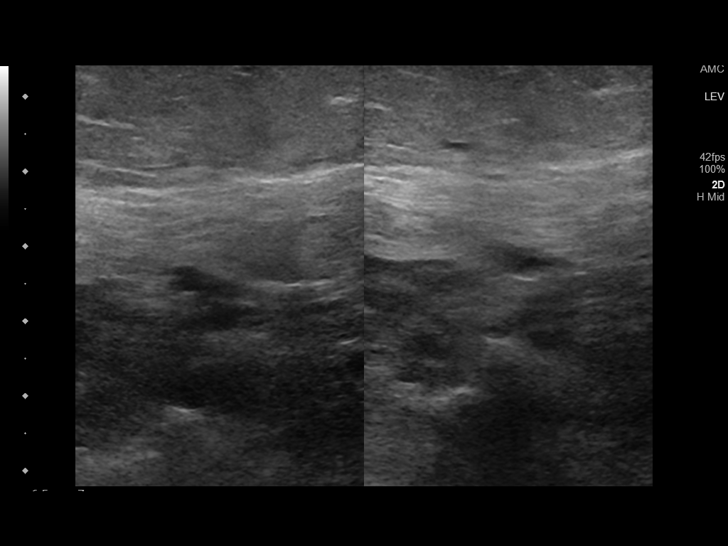
[im 26/50]
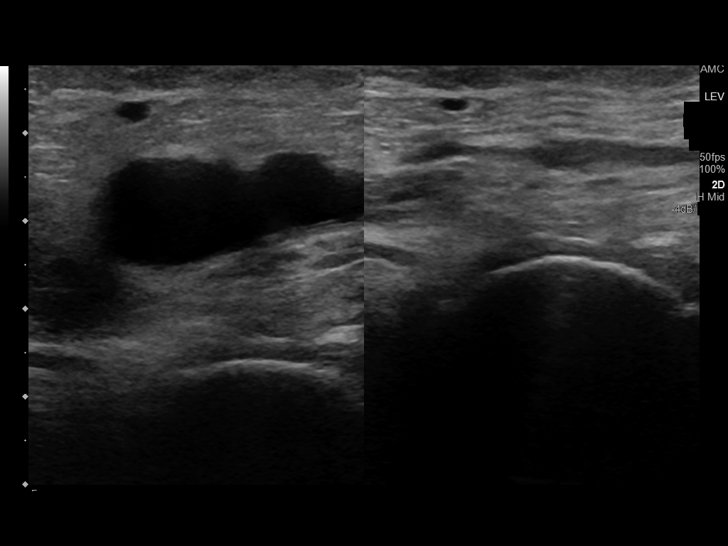
[im 28/50]
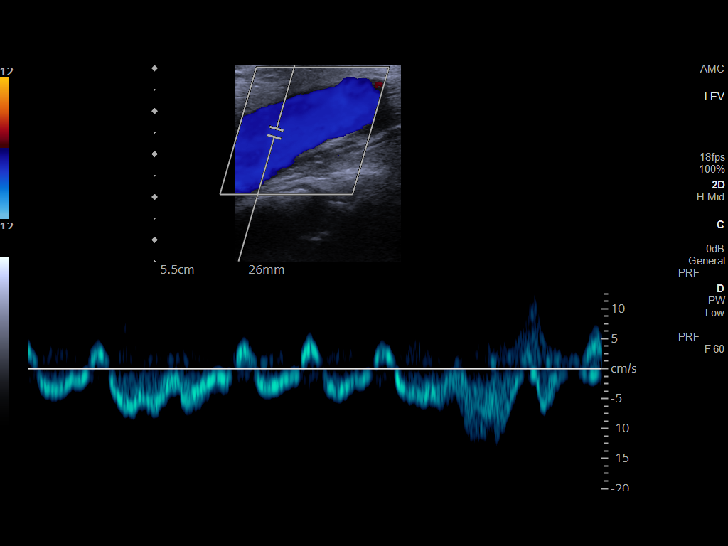
[im 32/50]
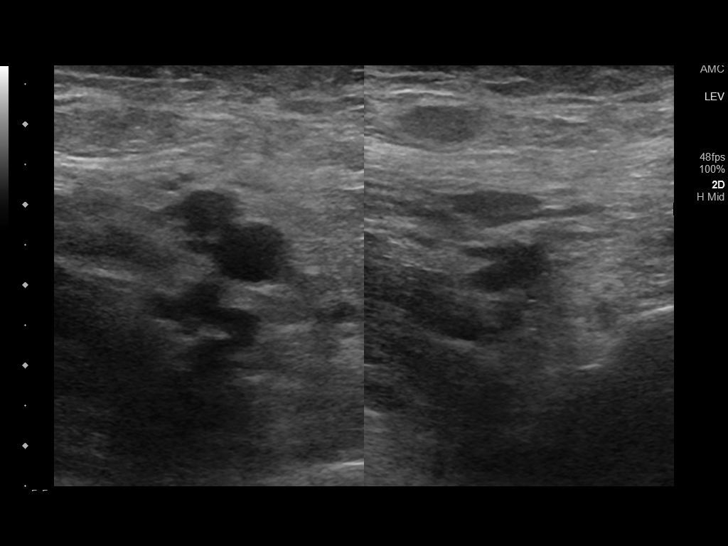
[im 37/50]
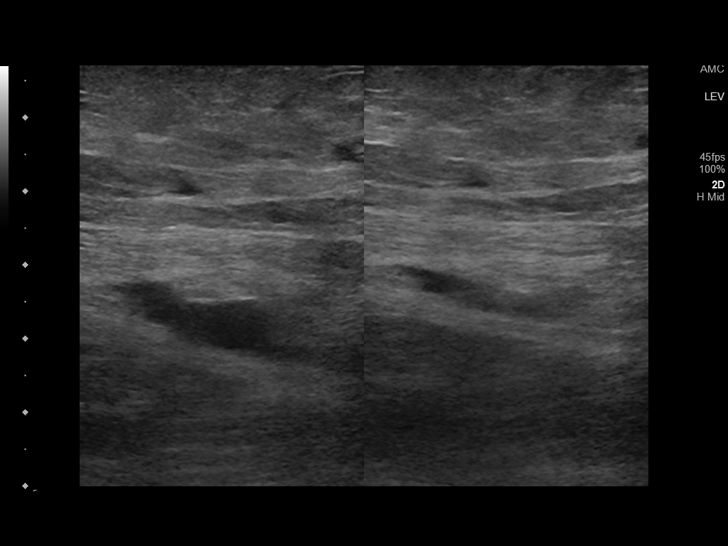
[im 41/50]
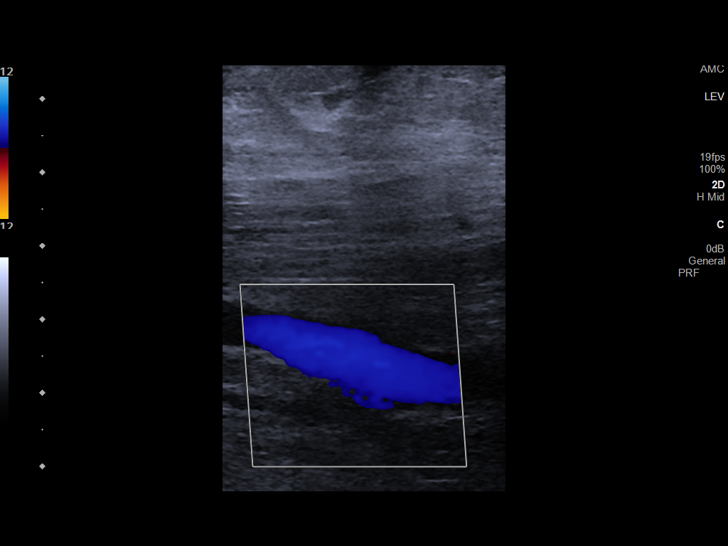
[im 45/50]
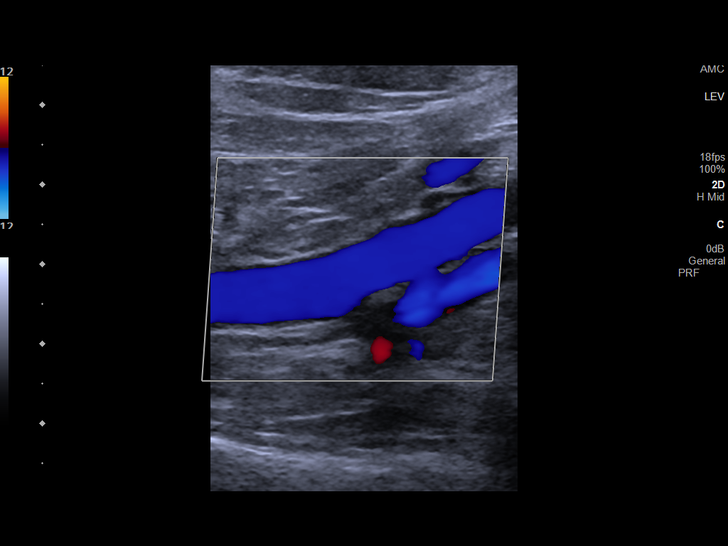
[im 50/50]
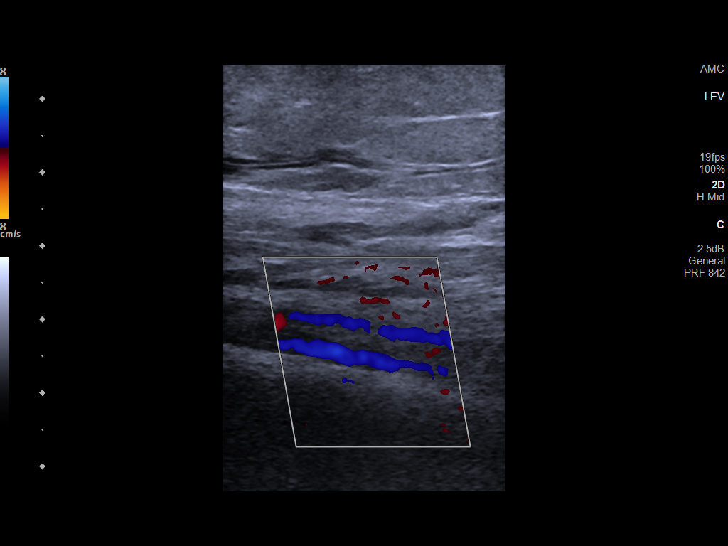

[13 of 24 positions shown; findings below may reference images not displayed]

FINDINGS: RIGHT LOWER EXTREMITY

Common Femoral Vein: No evidence of thrombus. Normal
compressibility, respiratory phasicity and response to augmentation.

Saphenofemoral Junction: No evidence of thrombus. Normal
compressibility and flow on color Doppler imaging.

Profunda Femoral Vein: No evidence of thrombus. Normal
compressibility and flow on color Doppler imaging.

Femoral Vein: No evidence of thrombus. Normal compressibility,
respiratory phasicity and response to augmentation.

Popliteal Vein: No evidence of thrombus. Normal compressibility,
respiratory phasicity and response to augmentation.

Calf Veins: No evidence of thrombus. Normal compressibility and flow
on color Doppler imaging.

Superficial Great Saphenous Vein: No evidence of thrombus. Normal
compressibility.

Venous Reflux:  None.

Other Findings:  None.

LEFT LOWER EXTREMITY

Common Femoral Vein: No evidence of thrombus. Normal
compressibility, respiratory phasicity and response to augmentation.

Saphenofemoral Junction: No evidence of thrombus. Normal
compressibility and flow on color Doppler imaging.

Profunda Femoral Vein: No evidence of thrombus. Normal
compressibility and flow on color Doppler imaging.

Femoral Vein: No evidence of thrombus. Normal compressibility,
respiratory phasicity and response to augmentation.

Popliteal Vein: No evidence of thrombus. Normal compressibility,
respiratory phasicity and response to augmentation.

Calf Veins: No evidence of thrombus. Normal compressibility and flow
on color Doppler imaging.

Superficial Great Saphenous Vein: No evidence of thrombus. Normal
compressibility.

Venous Reflux:  None.

Other Findings:  None.
IMPRESSION: No evidence of deep venous thrombosis in either lower extremity.

## 2022-03-12 ENCOUNTER — Telehealth: Payer: Self-pay

## 2022-03-12 NOTE — Telephone Encounter (Signed)
L mom to reschedule echo. Patient has an appt for 3/27, but needs echocardiogram scheduled 1st

## 2022-03-12 NOTE — Telephone Encounter (Signed)
-----   Message from Festus Aloe, CMA sent at 03/12/2022 11:12 AM EDT ----- Regarding: Echo Please contact this patient for an Echo. The patient no showed on 02/17/22 for the Echo and has a follow up scheduled for 03/17/2022.  Thanks, Jasmine December

## 2022-03-17 ENCOUNTER — Ambulatory Visit: Payer: Medicare Other | Admitting: Medical

## 2022-03-17 NOTE — Progress Notes (Incomplete)
?Cardiology Office Note:   ? ?Date:  03/17/2022  ? ?ID:  Martha Ramos, DOB January 09, 1971, MRN [id number] ? ?PCP:  Darnelle Spangle, MD  ?[facility] Cardiologist:  Julien Nordmann, MD  ?[facility] Electrophysiologist:  None  ? ?Referring MD: Darnelle Spangle, MD  ? ?Chief Complaint: surgical clearance, no show for echo ? ?History of Present Illness:   ? ?Martha Ramos is a 51 y.o. female with a hx of HFrEF, right lower lobe pulmonary embolism, WCT, NSVT, diabetes, hypertension, coronary artery disease, bipolar disorder, asthma who is being seen for surgical clearance.  ? ? ?Past Medical History:  ?Diagnosis Date  ? Asthma   ? Diabetes (HCC)   ? Essential hypertension   ? a. 06/2021 hypotensive in the setting of PE.  ? HFrEF (heart failure with reduced ejection fraction) (HCC)   ? a. 06/2021 Echo: EF <20%, no rwma, sev red RV fxn, mod BAE, mod MR/TR.  ? Moderate mitral regurgitation   ? Moderate tricuspid regurgitation   ? Morbid obesity (HCC)   ? NICM (nonischemic cardiomyopathy) (HCC)   ? a. 06/2021 Echo: EF <20%; b. 06/2021 Cath: Nl cors.  ? NSVT (nonsustained ventricular tachycardia)   ? a. 06/2021 placed on amio; b. 07/2021 Zio: RSR, avg HR 60. NSVT x 6 beats, SVT x 10 beats. Isolated PACs/PVCs. Some vent bigeminy. Triggered events = Sinus rhythm.  ? Schizoaffective disorder (HCC)   ? ? ?Past Surgical History:  ?Procedure Laterality Date  ? LEFT HEART CATH AND CORONARY ANGIOGRAPHY N/A 07/09/2021  ? Procedure: LEFT HEART CATH AND CORONARY ANGIOGRAPHY;  Surgeon: Tonny Bollman, MD;  Location: [facility] INVASIVE CV LAB;  Service: Cardiovascular;  Laterality: N/A;  ? ? ?Current Medications: ?No outpatient medications have been marked as taking for the 03/17/22 encounter (Appointment) with Fransico Michael, Geron Mulford H, PA-C.  ?  ? ?Allergies:   Patient has no known allergies.  ? ?Social History  ? ?Socioeconomic History  ? Marital status: Single  ?  Spouse name: Not on file  ? Number of children: Not on file  ? Years of  education: Not on file  ? Highest education level: Not on file  ?Occupational History  ? Not on file  ?Tobacco Use  ? Smoking status: Former  ?  Types: Cigarettes, Cigars  ? Smokeless tobacco: Never  ?Vaping Use  ? Vaping Use: Never used  ?Substance and Sexual Activity  ? Alcohol use: Not Currently  ? Drug use: Not Currently  ? Sexual activity: Not Currently  ?Other Topics Concern  ? Not on file  ?Social History Narrative  ? Not on file  ? ?Social Determinants of Health  ? ?Financial Resource Strain: Not on file  ?Food Insecurity: Not on file  ?Transportation Needs: Not on file  ?Physical Activity: Not on file  ?Stress: Not on file  ?Social Connections: Not on file  ?  ? ?Family History: ?The patient's ***family history includes Alcohol abuse in her father; Heart failure in her mother. ? ?ROS:   ?Please see the history of present illness.    ?*** All other systems reviewed and are negative. ? ?EKGs/Labs/Other Studies Reviewed:   ? ?The following studies were reviewed today: ?*** ? ?EKG:  EKG is *** ordered today.  The ekg ordered today demonstrates *** ? ?Recent Labs: ?07/06/2021: B Natriuretic Peptide 1,430.3 ?08/06/2021: TSH 0.846 ?08/08/2021: ALT 36; Hemoglobin 13.4; Magnesium 2.0; Platelets 308 ?12/31/2021: BUN 21; Creatinine, Ser 1.06; Potassium 4.1; Sodium 135  ?Recent Lipid Panel ?   ?Component Value Date/Time  ?  CHOL 210 (H) 08/06/2021 0026  ? TRIG 172 (H) 08/06/2021 0026  ? HDL 39 (L) 08/06/2021 0026  ? CHOLHDL 5.4 08/06/2021 0026  ? VLDL 34 08/06/2021 0026  ? LDLCALC 137 (H) 08/06/2021 0026  ? ? ? ?Risk Assessment/Calculations:   ?{Does this patient have ATRIAL FIBRILLATION?:[id number]} ? ? ?Physical Exam:   ? ?VS:  There were no vitals taken for this visit.   ? ?Wt Readings from Last 3 Encounters:  ?12/31/21 223 lb (101.2 kg)  ?12/30/21 218 lb 2 oz (98.9 kg)  ?11/28/21 221 lb 6 oz (100.4 kg)  ?  ? ?GEN: *** Well nourished, well developed in no acute distress ?HEENT: Normal ?NECK: No JVD; No carotid  bruits ?LYMPHATICS: No lymphadenopathy ?CARDIAC: ***RRR, no murmurs, rubs, gallops ?RESPIRATORY:  Clear to auscultation without rales, wheezing or rhonchi  ?ABDOMEN: Soft, non-tender, non-distended ?MUSCULOSKELETAL:  No edema; No deformity  ?SKIN: Warm and dry ?NEUROLOGIC:  Alert and oriented x 3 ?PSYCHIATRIC:  Normal affect  ? ?ASSESSMENT:   ? ?No diagnosis found. ?PLAN:   ? ?In order of problems listed above: ? ?*** ? ?Disposition: Follow up {follow up:15908} with ***  ? ?Shared Decision Making/Informed Consent   ?{Are you ordering a CV Procedure (e.g. stress test, cath, DCCV, TEE, etc)?   Press F2        :[id number]}  ? ? ?Signed, ?Scharlene Catalina David Stall, PA-C  ?03/17/2022 9:30 AM    ?[facility]  ?

## 2022-03-18 ENCOUNTER — Encounter: Payer: Self-pay | Admitting: Medical

## 2022-04-03 ENCOUNTER — Other Ambulatory Visit: Payer: Medicare Other

## 2022-04-07 ENCOUNTER — Other Ambulatory Visit: Payer: Self-pay | Admitting: Family

## 2022-04-08 ENCOUNTER — Ambulatory Visit: Payer: Medicare Other | Admitting: Family

## 2022-04-08 IMAGING — DX DG SHOULDER 2+V*L*
3 series · 3 of 3 positions shown · non-contrast
Comparison: None.

CLINICAL DATA: Recent fall with left shoulder pain, initial
encounter

EXAM:
LEFT SHOULDER - 2+ VIEW

[shoulder axial]
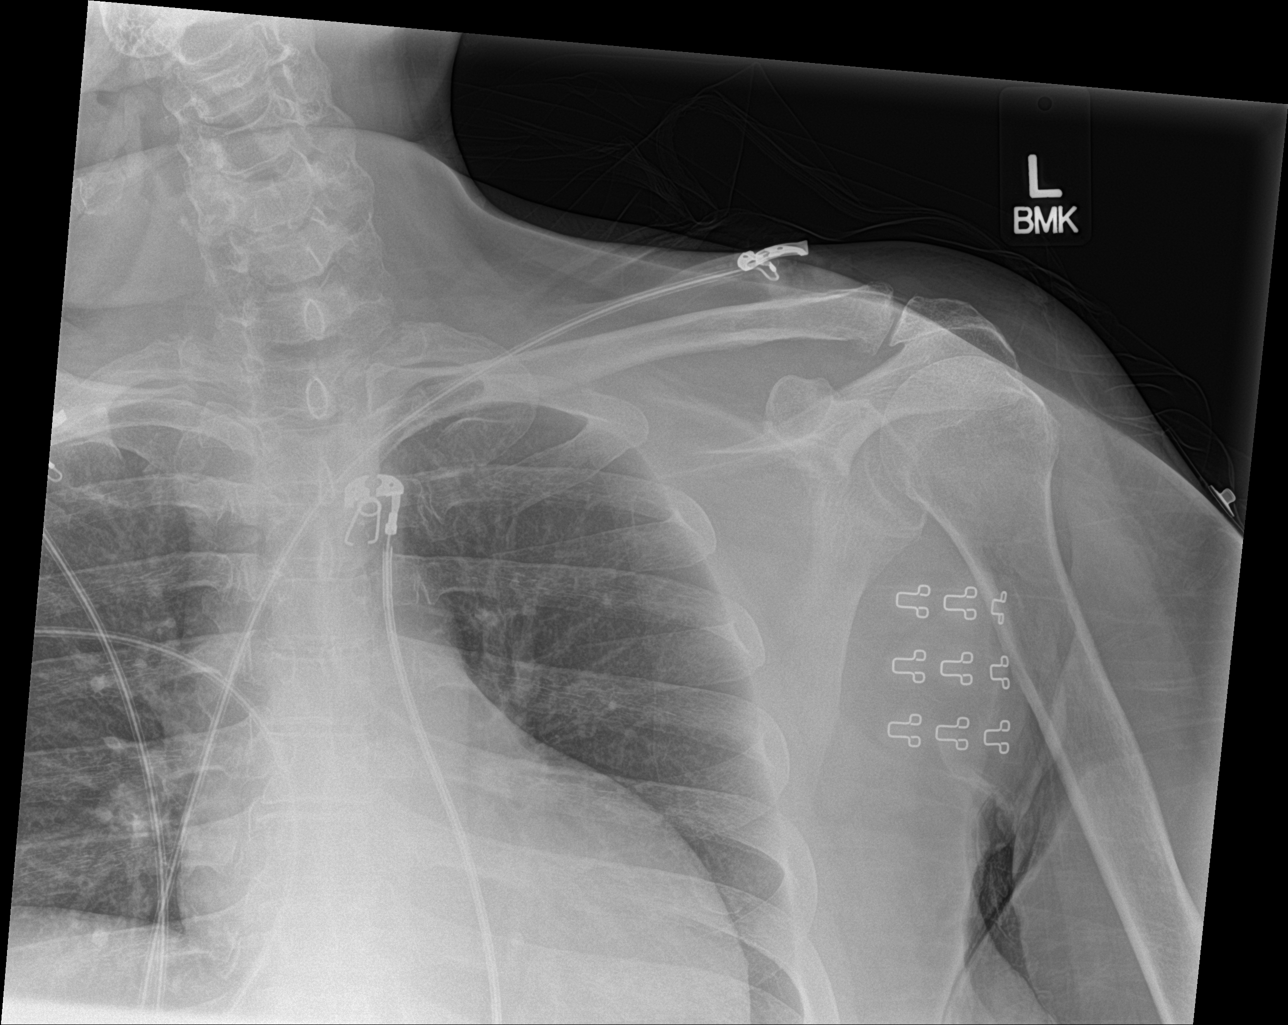

[shoulder ap]
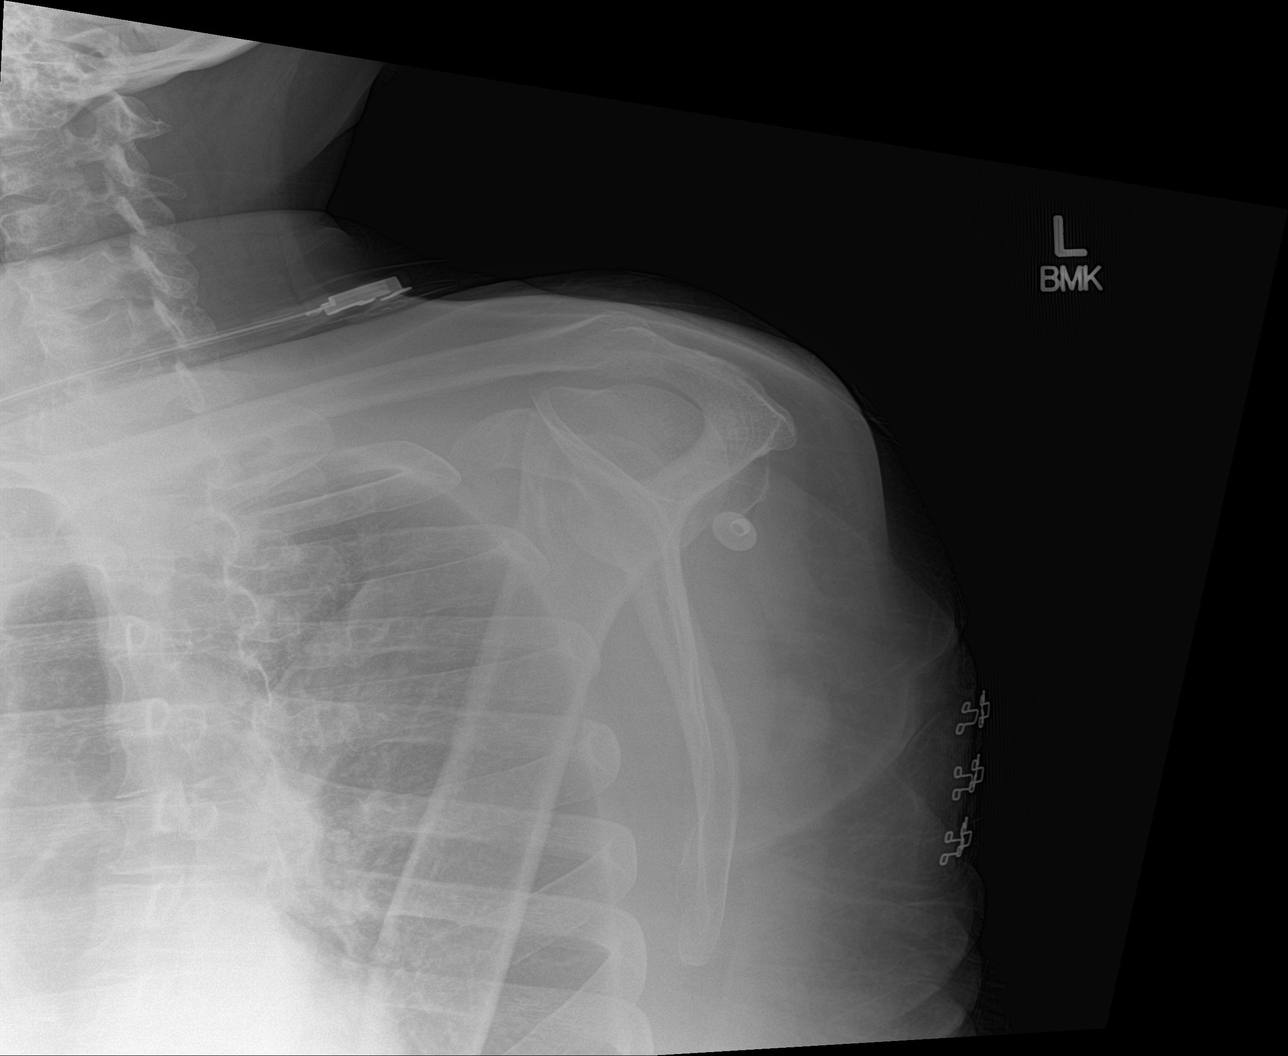

[shoulder obl]
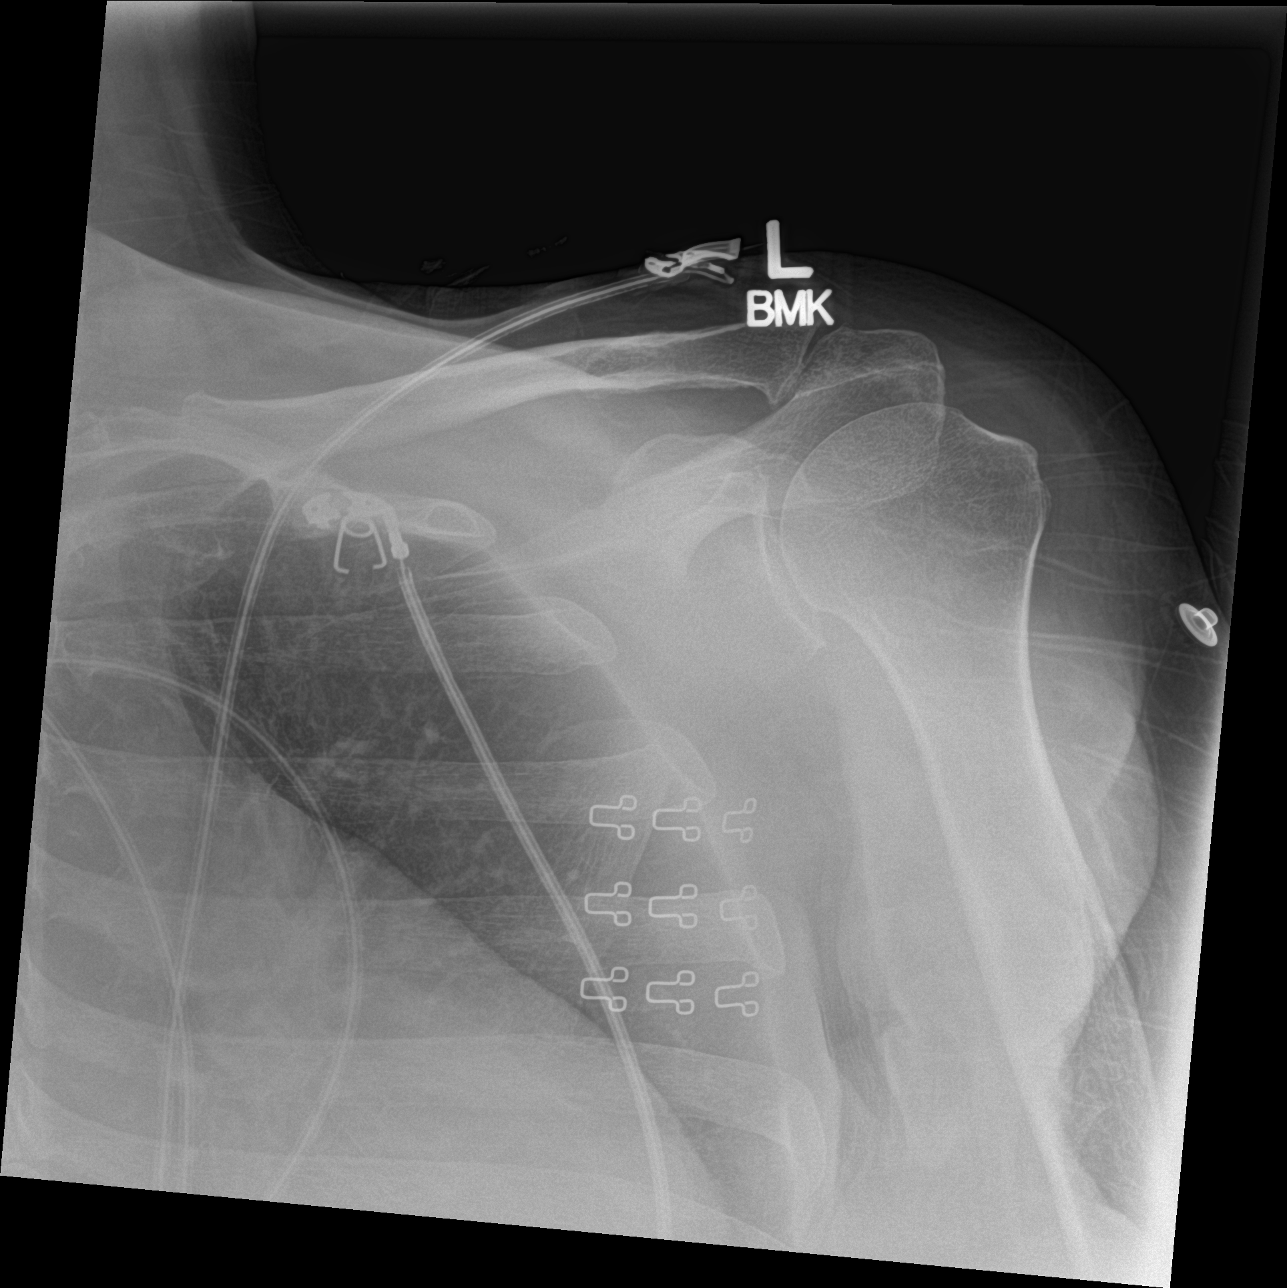

[3 of 3 positions shown; findings below may reference images not displayed]

FINDINGS: Degenerative changes of the acromioclavicular joint are seen. No
acute fracture or dislocation is noted. No soft tissue changes are
seen. Underlying bony thorax appears within normal limits.
IMPRESSION: Degenerative change without acute abnormality.

## 2022-04-19 NOTE — Progress Notes (Deleted)
Patient ID: Martha Ramos, female    DOB: 26-Nov-1971, 51 y.o.   MRN: [id number]  Martha Ramos is a 51 y/o female with a history of asthma, CAD, DM, HTN, bipolar and chronic heart failure.   Echo report from 07/05/21 reviewed and showed an EF of <20% along with moderate MR.   LHC done 07/09/21 showed: Angiographically normal coronary arteries Normal LVEDP Systemic hypotension  Has not been admitted or been in the ED in the last 6 months.   She presents today for a follow up visit with a chief complaint of   Past Medical History:  Diagnosis Date   Asthma    Diabetes (HCC)    Essential hypertension    a. 06/2021 hypotensive in the setting of PE.   HFrEF (heart failure with reduced ejection fraction) (HCC)    a. 06/2021 Echo: EF <20%, no rwma, sev red RV fxn, mod BAE, mod MR/TR.   Moderate mitral regurgitation    Moderate tricuspid regurgitation    Morbid obesity (HCC)    NICM (nonischemic cardiomyopathy) (HCC)    a. 06/2021 Echo: EF <20%; b. 06/2021 Cath: Nl cors.   NSVT (nonsustained ventricular tachycardia)    a. 06/2021 placed on amio; b. 07/2021 Zio: RSR, avg HR 60. NSVT x 6 beats, SVT x 10 beats. Isolated PACs/PVCs. Some vent bigeminy. Triggered events = Sinus rhythm.   Schizoaffective disorder [facility])    Past Surgical History:  Procedure Laterality Date   LEFT HEART CATH AND CORONARY ANGIOGRAPHY N/A 07/09/2021   Procedure: LEFT HEART CATH AND CORONARY ANGIOGRAPHY;  Surgeon: Tonny Bollman, MD;  Location: [facility] INVASIVE CV LAB;  Service: Cardiovascular;  Laterality: N/A;   Family History  Problem Relation Age of Onset   Heart failure Mother    Alcohol abuse Father    Social History   Tobacco Use   Smoking status: Former    Types: Cigarettes, Cigars   Smokeless tobacco: Never  Substance Use Topics   Alcohol use: Not Currently   No Known Allergies    Review of Systems  Constitutional:  Positive for fatigue (minimal). Negative for appetite change.  HENT:  Negative for  congestion, postnasal drip and sore throat.   Eyes: Negative.   Respiratory:  Negative for cough, chest tightness and shortness of breath.   Cardiovascular:  Negative for chest pain, palpitations and leg swelling.  Gastrointestinal:  Negative for abdominal distention and abdominal pain.  Endocrine: Negative.   Genitourinary: Negative.   Musculoskeletal:  Negative for back pain and neck pain.  Skin: Negative.   Allergic/Immunologic: Negative.   Neurological:  Negative for dizziness, syncope and light-headedness.  Hematological:  Negative for adenopathy. Does not bruise/bleed easily.  Psychiatric/Behavioral:  Negative for dysphoric mood and sleep disturbance (sleeps on 1 pillow). The patient is not nervous/anxious.       Physical Exam Vitals and nursing note reviewed.  Constitutional:      Appearance: Normal appearance.  HENT:     Head: Normocephalic and atraumatic.  Cardiovascular:     Rate and Rhythm: Normal rate and regular rhythm.     Heart sounds: Normal heart sounds. No murmur heard.   No gallop.  Pulmonary:     Effort: Pulmonary effort is normal. No respiratory distress.     Breath sounds: No wheezing or rales.  Abdominal:     General: There is no distension.     Palpations: Abdomen is soft.  Musculoskeletal:        General: No tenderness.  Cervical back: Normal range of motion and neck supple.     Right lower leg: No edema.     Left lower leg: No edema.  Skin:    General: Skin is warm and dry.  Neurological:     General: No focal deficit present.     Mental Status: She is alert and oriented to person, place, and time.  Psychiatric:        Mood and Affect: Affect is flat.        Thought Content: Thought content normal.    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 Each medication was verbally reviewed with the patient and she was encouraged to bring the bottles to every visit to confirm accuracy of list.

## 2022-04-21 ENCOUNTER — Ambulatory Visit: Payer: Medicare Other | Admitting: Family

## 2022-04-25 ENCOUNTER — Encounter: Payer: Self-pay | Admitting: Cardiovascular Disease

## 2022-04-25 NOTE — Progress Notes (Signed)
Unable to contact patient to schedule echocardiogram, letter sent, order cancelled. 

## 2022-05-27 ENCOUNTER — Ambulatory Visit: Payer: Medicare Other | Admitting: Family

## 2022-05-27 ENCOUNTER — Telehealth: Payer: Self-pay | Admitting: Family

## 2022-05-27 NOTE — Progress Notes (Deleted)
Patient ID: Martha Ramos, female    DOB: March 21, 1971, 51 y.o.   MRN: [id number]  Martha Ramos is a 51 y/o female with a history of asthma, CAD, DM, HTN, bipolar and chronic heart failure.   Echo report from 07/05/21 reviewed and showed an EF of <20% along with moderate MR.   LHC done 07/09/21 showed: Angiographically normal coronary arteries Normal LVEDP Systemic hypotension  Has not been admitted or been in the ED in the last 6 months.   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   Past Medical History:  Diagnosis Date   Asthma    Diabetes (HCC)    Essential hypertension    a. 06/2021 hypotensive in the setting of PE.   HFrEF (heart failure with reduced ejection fraction) (HCC)    a. 06/2021 Echo: EF <20%, no rwma, sev red RV fxn, mod BAE, mod MR/TR.   Moderate mitral regurgitation    Moderate tricuspid regurgitation    Morbid obesity (HCC)    NICM (nonischemic cardiomyopathy) (HCC)    a. 06/2021 Echo: EF <20%; b. 06/2021 Cath: Nl cors.   NSVT (nonsustained ventricular tachycardia)    a. 06/2021 placed on amio; b. 07/2021 Zio: RSR, avg HR 60. NSVT x 6 beats, SVT x 10 beats. Isolated PACs/PVCs. Some vent bigeminy. Triggered events = Sinus rhythm.   Schizoaffective disorder [facility])    Past Surgical History:  Procedure Laterality Date   LEFT HEART CATH AND CORONARY ANGIOGRAPHY N/A 07/09/2021   Procedure: LEFT HEART CATH AND CORONARY ANGIOGRAPHY;  Surgeon: Tonny Bollman, MD;  Location: [facility] INVASIVE CV LAB;  Service: Cardiovascular;  Laterality: N/A;   Family History  Problem Relation Age of Onset   Heart failure Mother    Alcohol abuse Father    Social History   Tobacco Use   Smoking status: Former    Types: Cigarettes, Cigars   Smokeless tobacco: Never  Substance Use Topics   Alcohol use: Not Currently   No Known  Allergies    Review of Systems  Constitutional:  Positive for fatigue (minimal). Negative for appetite change.  HENT:  Negative for congestion, postnasal drip and sore throat.   Eyes: Negative.   Respiratory:  Negative for cough, chest tightness and shortness of breath.   Cardiovascular:  Negative for chest pain, palpitations and leg swelling.  Gastrointestinal:  Negative for abdominal distention and abdominal pain.  Endocrine: Negative.   Genitourinary: Negative.   Musculoskeletal:  Negative for back pain and neck pain.  Skin: Negative.   Allergic/Immunologic: Negative.   Neurological:  Negative for dizziness, syncope and light-headedness.  Hematological:  Negative for adenopathy. Does not bruise/bleed easily.  Psychiatric/Behavioral:  Negative for dysphoric mood and sleep disturbance (sleeps on 1 pillow). The patient is not nervous/anxious.       Physical Exam Vitals and nursing note reviewed.  Constitutional:      Appearance: Normal appearance.  HENT:     Head: Normocephalic and atraumatic.  Cardiovascular:     Rate and Rhythm: Normal rate and regular rhythm.     Heart sounds: Normal heart sounds. No murmur heard.   No gallop.  Pulmonary:     Effort: Pulmonary effort is normal. No respiratory distress.     Breath sounds: No wheezing or rales.  Abdominal:     General: There is no distension.  Palpations: Abdomen is soft.  Musculoskeletal:        General: No tenderness.     Cervical back: Normal range of motion and neck supple.     Right lower leg: No edema.     Left lower leg: No edema.  Skin:    General: Skin is warm and dry.  Neurological:     General: No focal deficit present.     Mental Status: She is alert and oriented to person, place, and time.  Psychiatric:        Mood and Affect: Affect is flat.        Thought Content: Thought content normal.    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 Each medication was verbally reviewed with the patient and she was encouraged to bring the bottles to every visit to confirm accuracy of list.

## 2022-05-27 NOTE — Telephone Encounter (Signed)
Patient did not show for her [facility] appointment on 05/27/22. Will attempt to reschedule.

## 2022-06-26 ENCOUNTER — Telehealth: Payer: Self-pay | Admitting: Cardiovascular Disease

## 2022-06-26 NOTE — Telephone Encounter (Signed)
Patient needs echo then follow up visit.    Patient busy and states she will call back when ready to reschedule

## 2022-06-26 NOTE — Telephone Encounter (Signed)
-----   Message from Vergia Alberts, RN sent at 06/26/2022  7:24 AM EDT ----- Regarding: RE: 7/11 appointment She'll need to be rescheduled.   Victorino Dike - can you reach out to her?   Thanks! ----- Message ----- From: Garen Grams, CMA Sent: 06/25/2022   3:05 PM EDT To: Vergia Alberts, RN Subject: 7/11 appointment                               Hi,   This patient has an appointment with Dr. Mariah Milling 07/01/22. She was last seen by him on 12-30-21. During that visit, he ordered an Echocardiogram. She has not had that performed yet. It seems like she has had appointments for it in the past, but she has either no showed or cancelled those appointments. Would you like for the patient to be rescheduled or is it ok for her to keep this appointment?

## 2022-07-01 ENCOUNTER — Ambulatory Visit: Payer: Medicare Other | Admitting: Cardiovascular Disease

## 2022-08-12 ENCOUNTER — Ambulatory Visit: Payer: Medicare Other | Admitting: Medical

## 2022-08-13 ENCOUNTER — Encounter: Payer: Self-pay | Admitting: Medical

## 2022-09-05 ENCOUNTER — Ambulatory Visit: Payer: Medicare Other | Attending: Family | Admitting: Family

## 2022-09-05 ENCOUNTER — Encounter: Payer: Self-pay | Admitting: Family

## 2022-09-05 VITALS — BP 117/70 | HR 91 | Resp 20 | Ht 64.0 in | Wt 193.0 lb

## 2022-09-05 DIAGNOSIS — F17290 Nicotine dependence, other tobacco product, uncomplicated: Secondary | ICD-10-CM | POA: Diagnosis not present

## 2022-09-05 DIAGNOSIS — I10 Essential (primary) hypertension: Secondary | ICD-10-CM

## 2022-09-05 DIAGNOSIS — I5022 Chronic systolic (congestive) heart failure: Secondary | ICD-10-CM | POA: Diagnosis present

## 2022-09-05 DIAGNOSIS — I110 Hypertensive heart disease with heart failure: Secondary | ICD-10-CM | POA: Insufficient documentation

## 2022-09-05 DIAGNOSIS — I081 Rheumatic disorders of both mitral and tricuspid valves: Secondary | ICD-10-CM | POA: Insufficient documentation

## 2022-09-05 DIAGNOSIS — E119 Type 2 diabetes mellitus without complications: Secondary | ICD-10-CM | POA: Insufficient documentation

## 2022-09-05 DIAGNOSIS — F319 Bipolar disorder, unspecified: Secondary | ICD-10-CM

## 2022-09-05 DIAGNOSIS — I2510 Atherosclerotic heart disease of native coronary artery without angina pectoris: Secondary | ICD-10-CM | POA: Diagnosis not present

## 2022-09-05 DIAGNOSIS — Z720 Tobacco use: Secondary | ICD-10-CM

## 2022-09-05 MED ORDER — ENTRESTO 24-26 MG PO TABS: 1.0000 | ORAL_TABLET | Freq: Two times a day (BID) | ORAL | 3 refills | Status: DC

## 2022-09-05 NOTE — Progress Notes (Signed)
Patient ID: Martha Ramos, female    DOB: 1970/12/31, 51 y.o.   MRN: [id number]  Ms Bryand is a 51 y/o female with a history of asthma, CAD, DM, HTN, bipolar and chronic heart failure.   Echo report from 07/05/21 reviewed and showed an EF of <20% along with moderate MR.   LHC done 07/09/21 showed: Angiographically normal coronary arteries Normal LVEDP Systemic hypotension  Was in the ED 08/17/22 due to psych evaluation. Was in the ED 08/06/22 due to mental health.   She presents today for a follow with no complaints.Denies any difficulty sleeping, fatigue, dizziness, headaches, chest pain/pressure, palpitations, abdominal distention, nor swelling in the lower extremities, or weight gain.   Does not weigh daily nor monitor blood sugars. Has not been taking any of her heart failure medications as she said she didn't have them and needed new prescriptions. Missed her echo appt earlier this year.   Past Medical History:  Diagnosis Date   Asthma    Diabetes (HCC)    Essential hypertension    a. 06/2021 hypotensive in the setting of PE.   HFrEF (heart failure with reduced ejection fraction) (HCC)    a. 06/2021 Echo: EF <20%, no rwma, sev red RV fxn, mod BAE, mod MR/TR.   Moderate mitral regurgitation    Moderate tricuspid regurgitation    Morbid obesity (HCC)    NICM (nonischemic cardiomyopathy) (HCC)    a. 06/2021 Echo: EF <20%; b. 06/2021 Cath: Nl cors.   NSVT (nonsustained ventricular tachycardia)    a. 06/2021 placed on amio; b. 07/2021 Zio: RSR, avg HR 60. NSVT x 6 beats, SVT x 10 beats. Isolated PACs/PVCs. Some vent bigeminy. Triggered events = Sinus rhythm.   Schizoaffective disorder [facility])    Past Surgical History:  Procedure Laterality Date   LEFT HEART CATH AND CORONARY ANGIOGRAPHY N/A 07/09/2021   Procedure: LEFT HEART CATH AND CORONARY ANGIOGRAPHY;  Surgeon: Tonny Bollman, MD;  Location: [facility] INVASIVE CV LAB;  Service: Cardiovascular;  Laterality: N/A;   Family History   Problem Relation Age of Onset   Heart failure Mother    Alcohol abuse Father    Social History   Tobacco Use   Smoking status: Former    Types: Cigarettes, Cigars   Smokeless tobacco: Never  Substance Use Topics   Alcohol use: Not Currently   No Known Allergies  Prior to Admission medications   Medication Sig Start Date End Date Taking? Authorizing Provider  glipiZIDE (GLUCOTROL) 5 MG tablet Take 5 mg by mouth daily before breakfast. 04/17/21  Yes [provider]  haloperidol (HALDOL) 10 MG tablet Take 10 mg by mouth at bedtime. 05/15/21  Yes [provider]  acetaminophen (TYLENOL) 650 MG CR tablet Take 650 mg by mouth every 8 (eight) hours as needed for pain.    [provider]  apixaban (ELIQUIS) 5 MG TABS tablet Take 1 tablet (5 mg total) by mouth 2 (two) times daily for 23 days. Specifically: Take 10mg  twice daily (7/22 PM-7/27 PM); then start 5mg  twice daily dosing (7/28 AM). Patient not taking: Reported on 09/05/2022 07/18/21   09/07/2022, MD  atorvastatin (LIPITOR) 20 MG tablet Take 1 tablet (20 mg total) by mouth daily. Patient not taking: Reported on 09/05/2022 12/30/21   09/07/2022, MD  DULoxetine (CYMBALTA) 60 MG capsule Take 60 mg by mouth daily. Patient not taking: Reported on 10/22/2021 05/21/21   [provider]  metoprolol succinate (TOPROL-XL) 25 MG 24 hr tablet Take 1  tablet (25 mg total) by mouth daily. Patient not taking: Reported on 09/05/2022 12/30/21   Antonieta Iba, MD  potassium chloride SA (KLOR-CON) 20 MEQ tablet Take 20 mEq by mouth daily. Patient not taking: Reported on 09/05/2022    [provider]  sacubitril-valsartan (ENTRESTO) 24-26 MG Take 1 tablet by mouth 2 (two) times daily. 09/05/22   Delma Freeze, FNP  VENTOLIN HFA 108 (90 Base) MCG/ACT inhaler Inhale 1-2 puffs into the lungs every 4 (four) hours as needed. Patient not taking: Reported on 09/05/2022 04/17/21   [provider]    Review  of Systems  Constitutional:  Negative for appetite change and fatigue.  HENT:  Negative for congestion, postnasal drip and sore throat.   Eyes: Negative.   Respiratory:  Negative for cough, chest tightness and shortness of breath.   Cardiovascular:  Negative for chest pain, palpitations and leg swelling.  Gastrointestinal:  Negative for abdominal distention and abdominal pain.  Endocrine: Negative.   Genitourinary: Negative.   Musculoskeletal:  Negative for back pain and neck pain.  Skin: Negative.   Allergic/Immunologic: Negative.   Neurological:  Negative for dizziness, syncope and light-headedness.  Hematological:  Negative for adenopathy. Does not bruise/bleed easily.  Psychiatric/Behavioral:  Negative for dysphoric mood and sleep disturbance (sleeps on 1 pillow). The patient is not nervous/anxious.    Vitals:   09/05/22 1304  BP: 117/70  Pulse: 91  Resp: 20  SpO2: 100%    Wt Readings from Last 3 Encounters:  09/05/22 193 lb (87.5 kg)  12/31/21 223 lb (101.2 kg)  12/30/21 218 lb 2 oz (98.9 kg)    Lab Results  Component Value Date   CREATININE 1.06 (H) 12/31/2021   CREATININE 0.83 08/08/2021   CREATININE 0.85 08/07/2021     Physical Exam Vitals and nursing note reviewed.  Constitutional:      Appearance: Normal appearance.  HENT:     Head: Normocephalic and atraumatic.  Cardiovascular:     Rate and Rhythm: Normal rate and regular rhythm.     Heart sounds: Normal heart sounds. No murmur heard.    No gallop.  Pulmonary:     Effort: Pulmonary effort is normal. No respiratory distress.     Breath sounds: No wheezing or rales.  Abdominal:     General: There is no distension.     Palpations: Abdomen is soft.  Musculoskeletal:        General: No tenderness.     Cervical back: Normal range of motion and neck supple.     Right lower leg: No edema.     Left lower leg: No edema.  Skin:    General: Skin is warm and dry.  Neurological:     General: No focal deficit  present.     Mental Status: She is alert and oriented to person, place, and time.  Psychiatric:        Mood and Affect: Affect is not flat.        Thought Content: Thought content normal.     Assessment & Plan:  1: Chronic heart failure with reduced ejection fraction- - NYHA class I - euvolemic today - not weighing daily but does have working scales; instructed to resume weighing daily and to call for an overnight weight gain of > 2 pounds or a weekly weight gain of > 5 pounds - weight down 30 pounds since last visit here in January 2023 - not adding salt to foods - currently not on any GDMT;  entresto 24/26mg  BID resumed today - check labs next visit - saw cardiology Mariah Milling) 12/30/21;  - had echo scheduled for 01/24/22 (this appt was missed); echo has been r/s for 09/19/22 - BNP 07/06/21 was 1430.3 - gift card given so patient can get her entresto as she says she can't afford the copay today  2: HTN- - BP 117/70 - saw PCP Sherlon Handing) 09/03/22 ([facility]) - BMP 08/18/22 reviewed and showed sodium 141, potassium 3.8, creatinine 0.81 and GFR 87  3: DM- - A1c 08/07/22 was 8.1% - doesn't consistently check glucose  4: Bipolar- - had telemedicine visit with psychiatry 08/18/22  5: Tobacco use- - smoking 2 cigars every day - complete cessation discussed    Patient brought her medications and each medication was verbally reviewed with the patient and she was encouraged to bring the bottles to every visit to confirm accuracy of list.   Return in 2 weeks, sooner for any questions/problems.

## 2022-09-05 NOTE — Patient Instructions (Addendum)
Continue weighing daily and call for an overnight weight gain of 3 pounds or more or a weekly weight gain of more than 5 pounds.   Begin Entresto one pill in the morning and one pill in the evening.

## 2022-09-19 ENCOUNTER — Ambulatory Visit (HOSPITAL_BASED_OUTPATIENT_CLINIC_OR_DEPARTMENT_OTHER): Payer: Medicare Other | Admitting: Family

## 2022-09-19 ENCOUNTER — Ambulatory Visit
Admission: RE | Admit: 2022-09-19 | Discharge: 2022-09-19 | Disposition: A | Discharge status: Home / Self Care | Payer: Medicare Other | Source: Ambulatory Visit | Attending: Family | Admitting: Family

## 2022-09-19 ENCOUNTER — Encounter: Payer: Self-pay | Admitting: Family

## 2022-09-19 VITALS — BP 132/70 | HR 75 | Resp 18 | Ht 64.0 in | Wt 195.5 lb

## 2022-09-19 DIAGNOSIS — I081 Rheumatic disorders of both mitral and tricuspid valves: Secondary | ICD-10-CM | POA: Insufficient documentation

## 2022-09-19 DIAGNOSIS — E119 Type 2 diabetes mellitus without complications: Secondary | ICD-10-CM | POA: Insufficient documentation

## 2022-09-19 DIAGNOSIS — F319 Bipolar disorder, unspecified: Secondary | ICD-10-CM

## 2022-09-19 DIAGNOSIS — I428 Other cardiomyopathies: Secondary | ICD-10-CM | POA: Insufficient documentation

## 2022-09-19 DIAGNOSIS — I5022 Chronic systolic (congestive) heart failure: Secondary | ICD-10-CM

## 2022-09-19 DIAGNOSIS — Z720 Tobacco use: Secondary | ICD-10-CM

## 2022-09-19 DIAGNOSIS — I10 Essential (primary) hypertension: Secondary | ICD-10-CM

## 2022-09-19 DIAGNOSIS — I110 Hypertensive heart disease with heart failure: Secondary | ICD-10-CM | POA: Insufficient documentation

## 2022-09-19 LAB — ECHOCARDIOGRAM COMPLETE
AR max vel: 2.69 cm2
AV Area VTI: 2.83 cm2
AV Area mean vel: 2.62 cm2
AV Mean grad: 4 mmHg
AV Peak grad: 6.4 mmHg
Ao pk vel: 1.26 m/s
Area-P 1/2: 3.34 cm2
Calc EF: 38.7 %
Height: 64 in
S' Lateral: 4.7 cm
Single Plane A2C EF: 39.6 %
Single Plane A4C EF: 39 %
Weight: 3128 oz

## 2022-09-19 NOTE — Progress Notes (Signed)
*  PRELIMINARY RESULTS* Echocardiogram 2D Echocardiogram has been performed.  [name] 09/19/2022, 11:47 AM

## 2022-09-19 NOTE — Patient Instructions (Addendum)
Resume weighing daily and call for an overnight weight gain of 3 pounds or more or a weekly weight gain of more than 5 pounds.   If you have voicemail, please make sure your mailbox is cleaned out so that we may leave a message and please make sure to listen to any voicemails.     

## 2022-09-19 NOTE — Progress Notes (Signed)
Patient ID: Martha Ramos, female    DOB: 07-31-1971, 51 y.o.   MRN: [id number]  Martha Ramos is a 51 y/o female with a history of asthma, CAD, DM, HTN, bipolar and chronic heart failure.   Echo report from 07/05/21 reviewed and showed an EF of <20% along with moderate MR.   LHC done 07/09/21 showed: Angiographically normal coronary arteries Normal LVEDP Systemic hypotension  Was in the ED 08/17/22 due to psych evaluation. Was in the ED 08/06/22 due to mental health.   She presents today for a follow-up visit with no complaints.She specifically denies any difficulty sleeping, dizziness, abdominal distention, palpitations, pedal edema, chest pain, shortness of breath, cough or fatigue.   Not weighing daily but does have working scales at home. Is having an echo done after leaving here today  Past Medical History:  Diagnosis Date   Asthma    Diabetes ([place])    Essential hypertension    a. 06/2021 hypotensive in the setting of PE.   HFrEF (heart failure with reduced ejection fraction) ([place])    a. 06/2021 Echo: EF <20%, no rwma, sev red RV fxn, mod BAE, mod MR/TR.   Moderate mitral regurgitation    Moderate tricuspid regurgitation    Morbid obesity (HCC)    NICM (nonischemic cardiomyopathy) ([place])    a. 06/2021 Echo: EF <20%; b. 06/2021 Cath: Nl cors.   NSVT (nonsustained ventricular tachycardia) ([place])    a. 06/2021 placed on amio; b. 07/2021 Zio: RSR, avg HR 60. NSVT x 6 beats, SVT x 10 beats. Isolated PACs/PVCs. Some vent bigeminy. Triggered events = Sinus rhythm.   Schizoaffective disorder [facility])    Past Surgical History:  Procedure Laterality Date   LEFT HEART CATH AND CORONARY ANGIOGRAPHY N/A 07/09/2021   Procedure: LEFT HEART CATH AND CORONARY ANGIOGRAPHY;  Surgeon: Sherren Mocha, MD;  Location: [place] CV LAB;  Service: Cardiovascular;  Laterality: N/A;   Family History  Problem Relation Age of Onset   Heart failure Mother    Alcohol abuse Father    Social History    Tobacco Use   Smoking status: Former    Types: Cigarettes, Cigars   Smokeless tobacco: Never  Substance Use Topics   Alcohol use: Not Currently   No Known Allergies  Prior to Admission medications   Medication Sig Start Date End Date Taking? Authorizing Provider  benztropine (COGENTIN) 0.5 MG tablet Take 0.5 mg by mouth at bedtime.   Yes [provider]  divalproex (DEPAKOTE) 500 MG DR tablet Take 500 mg by mouth 2 (two) times daily.   Yes [provider]  glipiZIDE (GLUCOTROL) 5 MG tablet Take 5 mg by mouth daily before breakfast. 04/17/21  Yes [provider]  haloperidol (HALDOL) 10 MG tablet Take 10 mg by mouth at bedtime. 05/15/21  Yes [provider]  sacubitril-valsartan (ENTRESTO) 24-26 MG Take 1 tablet by mouth 2 (two) times daily. 09/05/22  Yes Alisa Graff, FNP  apixaban (ELIQUIS) 5 MG TABS tablet Take 1 tablet (5 mg total) by mouth 2 (two) times daily for 23 days. Specifically: Take 10mg  twice daily (7/22 PM-7/27 PM); then start 5mg  twice daily dosing (7/28 AM). Patient not taking: Reported on 09/05/2022 07/18/21   Nolberto Hanlon, MD  atorvastatin (LIPITOR) 20 MG tablet Take 1 tablet (20 mg total) by mouth daily. Patient not taking: Reported on 09/05/2022 12/30/21   Minna Merritts, MD  DULoxetine (CYMBALTA) 60 MG capsule Take 60 mg by mouth daily. Patient not taking: Reported  on 10/22/2021 05/21/21   [provider]  metoprolol succinate (TOPROL-XL) 25 MG 24 hr tablet Take 1 tablet (25 mg total) by mouth daily. Patient not taking: Reported on 09/05/2022 12/30/21   Antonieta Iba, MD  potassium chloride SA (KLOR-CON) 20 MEQ tablet Take 20 mEq by mouth daily. Patient not taking: Reported on 09/05/2022    [provider]  VENTOLIN HFA 108 (90 Base) MCG/ACT inhaler Inhale 1-2 puffs into the lungs every 4 (four) hours as needed. Patient not taking: Reported on 09/05/2022 04/17/21   [provider]    Review of Systems   Constitutional:  Negative for appetite change and fatigue.  HENT:  Negative for congestion, postnasal drip and sore throat.   Eyes: Negative.   Respiratory:  Negative for cough, chest tightness and shortness of breath.   Cardiovascular:  Negative for chest pain, palpitations and leg swelling.  Gastrointestinal:  Negative for abdominal distention and abdominal pain.  Endocrine: Negative.   Genitourinary: Negative.   Musculoskeletal:  Negative for back pain and neck pain.  Skin: Negative.   Allergic/Immunologic: Negative.   Neurological:  Negative for dizziness, syncope and light-headedness.  Hematological:  Negative for adenopathy. Does not bruise/bleed easily.  Psychiatric/Behavioral:  Negative for dysphoric mood and sleep disturbance (sleeps on 1 pillow). The patient is not nervous/anxious.    Vitals:   09/19/22 1005  BP: 132/70  Pulse: 75  Resp: 18  SpO2: 100%  Weight: 195 lb 8 oz (88.7 kg)  Height: 5\' 4"  (1.626 m)   Wt Readings from Last 3 Encounters:  09/19/22 195 lb 8 oz (88.7 kg)  09/05/22 193 lb (87.5 kg)  12/31/21 223 lb (101.2 kg)   Lab Results  Component Value Date   CREATININE 1.06 (H) 12/31/2021   CREATININE 0.83 08/08/2021   CREATININE 0.85 08/07/2021   Physical Exam Vitals and nursing note reviewed.  Constitutional:      Appearance: Normal appearance.  HENT:     Head: Normocephalic and atraumatic.  Cardiovascular:     Rate and Rhythm: Normal rate and regular rhythm.     Heart sounds: Normal heart sounds. No murmur heard. Pulmonary:     Effort: Pulmonary effort is normal. No respiratory distress.     Breath sounds: No wheezing or rales.  Abdominal:     General: There is no distension.     Palpations: Abdomen is soft.  Musculoskeletal:        General: No tenderness.     Cervical back: Normal range of motion and neck supple.     Right lower leg: No edema.     Left lower leg: No edema.  Skin:    General: Skin is warm and dry.  Neurological:      General: No focal deficit present.     Mental Status: She is alert and oriented to person, place, and time.  Psychiatric:        Mood and Affect: Affect is not flat.        Thought Content: Thought content normal.     Assessment & Plan:  1: Chronic heart failure with reduced ejection fraction- - NYHA class I - euvolemic today - not weighing daily but does have working scales; emphasized weighing daily and to call for an overnight weight gain of > 2 pounds or a weekly weight gain of > 5 pounds - weight up 2.8 pounds since last visit here 2 weeks ago - not adding salt to foods - on GDMT of entresto -  will wait and check BMP at next visit in 2 weeks - saw cardiology Mariah Milling) 12/30/21 - having echo done after her visit today; discussed possible med additions depending on these results - BNP 07/06/21 was 1430.3  2: HTN- - BP looks good (132/70) - saw PCP Sherlon Handing) 09/03/22 ([facility]) - BMP 08/18/22 reviewed and showed sodium 141, potassium 3.8, creatinine 0.81 and GFR 87  3: DM- - A1c 08/07/22 was 8.1% - doesn't consistently check glucose  4: Bipolar- - had telemedicine visit with psychiatry 08/18/22  5: Tobacco use- - smoking 3 cigars every day - complete cessation discussed    Patient brought her medications and each medication was verbally reviewed with the patient and she was encouraged to bring the bottles to every visit to confirm accuracy of list.   Return in 2 weeks, sooner if needed.

## 2022-10-03 ENCOUNTER — Telehealth: Payer: Self-pay | Admitting: Family

## 2022-10-03 ENCOUNTER — Ambulatory Visit: Payer: Medicare Other | Admitting: Family

## 2022-10-03 NOTE — Telephone Encounter (Signed)
Patient did not show for her Heart Failure [facility] appointment on 10/03/22. Will attempt to reschedule.

## 2023-03-18 ENCOUNTER — Encounter: Payer: Medicare Other | Admitting: Family

## 2023-03-18 NOTE — Progress Notes (Deleted)
Patient ID: Martha Ramos, female    DOB: 12-30-1970, 52 y.o.   MRN: [id number]  Martha Ramos is a 51 y/o female with a history of asthma, CAD, DM, HTN, bipolar and chronic heart failure.   Echo 09/19/22: EF 25-30% along with mild LVH, moderate LAE & mild/moderate MR. Echo 07/05/21: EF of <20% along with moderate MR.   LHC done 07/09/21 showed: Angiographically normal coronary arteries Normal LVEDP Systemic hypotension  Has not been admitted or been in the ED in the last 6 months.   She presents today for a HF follow-up visit with no complaints.  Past Medical History:  Diagnosis Date   Asthma    Diabetes ([place])    Essential hypertension    a. 06/2021 hypotensive in the setting of PE.   HFrEF (heart failure with reduced ejection fraction) ([place])    a. 06/2021 Echo: EF <20%, no rwma, sev red RV fxn, mod BAE, mod MR/TR.   Moderate mitral regurgitation    Moderate tricuspid regurgitation    Morbid obesity (HCC)    NICM (nonischemic cardiomyopathy) ([place])    a. 06/2021 Echo: EF <20%; b. 06/2021 Cath: Nl cors.   NSVT (nonsustained ventricular tachycardia) ([place])    a. 06/2021 placed on amio; b. 07/2021 Zio: RSR, avg HR 60. NSVT x 6 beats, SVT x 10 beats. Isolated PACs/PVCs. Some vent bigeminy. Triggered events = Sinus rhythm.   Schizoaffective disorder [facility])    Past Surgical History:  Procedure Laterality Date   LEFT HEART CATH AND CORONARY ANGIOGRAPHY N/A 07/09/2021   Procedure: LEFT HEART CATH AND CORONARY ANGIOGRAPHY;  Surgeon: Sherren Mocha, MD;  Location: [place] CV LAB;  Service: Cardiovascular;  Laterality: N/A;   Family History  Problem Relation Age of Onset   Heart failure Mother    Alcohol abuse Father    Social History   Tobacco Use   Smoking status: Every Day    Types: Cigarettes, Cigars   Smokeless tobacco: Never  Substance Use Topics   Alcohol use: Not Currently   No Known Allergies    Review of Systems  Constitutional:  Negative for appetite change and  fatigue.  HENT:  Negative for congestion, postnasal drip and sore throat.   Eyes: Negative.   Respiratory:  Negative for cough, chest tightness and shortness of breath.   Cardiovascular:  Negative for chest pain, palpitations and leg swelling.  Gastrointestinal:  Negative for abdominal distention and abdominal pain.  Endocrine: Negative.   Genitourinary: Negative.   Musculoskeletal:  Negative for back pain and neck pain.  Skin: Negative.   Allergic/Immunologic: Negative.   Neurological:  Negative for dizziness, syncope and light-headedness.  Hematological:  Negative for adenopathy. Does not bruise/bleed easily.  Psychiatric/Behavioral:  Negative for dysphoric mood and sleep disturbance (sleeps on 1 pillow). The patient is not nervous/anxious.      Physical Exam Vitals and nursing note reviewed.  Constitutional:      Appearance: Normal appearance.  HENT:     Head: Normocephalic and atraumatic.  Cardiovascular:     Rate and Rhythm: Normal rate and regular rhythm.     Heart sounds: Normal heart sounds. No murmur heard. Pulmonary:     Effort: Pulmonary effort is normal. No respiratory distress.     Breath sounds: No wheezing or rales.  Abdominal:     General: There is no distension.     Palpations: Abdomen is soft.  Musculoskeletal:        General: No tenderness.     Cervical  back: Normal range of motion and neck supple.     Right lower leg: No edema.     Left lower leg: No edema.  Skin:    General: Skin is warm and dry.  Neurological:     General: No focal deficit present.     Mental Status: She is alert and oriented to person, place, and time.  Psychiatric:        Mood and Affect: Affect is not flat.        Thought Content: Thought content normal.     Assessment & Plan:  1: Chronic heart failure with reduced ejection fraction- - NYHA class I - euvolemic today - not weighing daily but does have working scales; emphasized weighing daily and to call for an overnight  weight gain of > 2 pounds or a weekly weight gain of > 5 pounds - weight 195.8 pounds since last visit here 6 months ago -  - not adding salt to foods - continue entresto  - saw cardiology Rockey Situ) 12/30/21  - BNP 07/06/21 was 1430.3  2: HTN- - BP  - saw PCP Norma Fredrickson) 09/03/22 ([facility]) - BMP 08/18/22 reviewed and showed sodium 141, potassium 3.8, creatinine 0.81 and GFR 87  3: DM- - A1c 08/07/22 was 8.1% - doesn't consistently check glucose  4: Bipolar- - had telemedicine visit with psychiatry 08/18/22  5: Tobacco use- - smoking 3 cigars every day - complete cessation discussed

## 2023-09-28 ENCOUNTER — Other Ambulatory Visit: Payer: Self-pay

## 2023-09-28 MED ORDER — ENTRESTO 24-26 MG PO TABS: 1.0000 | ORAL_TABLET | Freq: Two times a day (BID) | ORAL | 0 refills | Status: AC

## 2023-09-30 ENCOUNTER — Telehealth: Payer: Self-pay

## 2023-09-30 NOTE — Telephone Encounter (Signed)
Attempted to call pt to sched f/u appt with Clarisa Kindred, FNP. No answer.no voicemail set up.

## 2023-11-21 ENCOUNTER — Other Ambulatory Visit: Payer: Self-pay | Admitting: Family

## 2023-11-23 ENCOUNTER — Telehealth: Payer: Self-pay

## 2023-11-23 NOTE — Telephone Encounter (Signed)
Received automated request for entresto refill. Pt has not been seen in clinic in over a year, and has had multiple no show appointments.  Attempted to schedule the pt an appt in October, and again today without success.  Pt's daughter/emergency contact number not in service. Per Clarisa Kindred, FNP, do not refill entresto until the patient schedule a follow up appointment.
